# Patient Record
Sex: Female | Born: 1965 | Race: Black or African American | Hispanic: No | Marital: Single | Smoking: Never smoker
Health system: Southern US, Community
[De-identification: ages and names within clinical notes are randomized; demographics above are authoritative.]

## PROBLEM LIST (undated history)

## (undated) DIAGNOSIS — I10 Essential (primary) hypertension: Secondary | ICD-10-CM

## (undated) DIAGNOSIS — M549 Dorsalgia, unspecified: Secondary | ICD-10-CM

## (undated) DIAGNOSIS — F419 Anxiety disorder, unspecified: Secondary | ICD-10-CM

## (undated) DIAGNOSIS — D649 Anemia, unspecified: Secondary | ICD-10-CM

## (undated) DIAGNOSIS — M1990 Unspecified osteoarthritis, unspecified site: Secondary | ICD-10-CM

## (undated) DIAGNOSIS — M5430 Sciatica, unspecified side: Secondary | ICD-10-CM

## (undated) DIAGNOSIS — E119 Type 2 diabetes mellitus without complications: Secondary | ICD-10-CM

## (undated) DIAGNOSIS — G4730 Sleep apnea, unspecified: Secondary | ICD-10-CM

## (undated) DIAGNOSIS — Z8739 Personal history of other diseases of the musculoskeletal system and connective tissue: Secondary | ICD-10-CM

## (undated) DIAGNOSIS — G8929 Other chronic pain: Secondary | ICD-10-CM

## (undated) HISTORY — PX: CHOLECYSTECTOMY: SHX55

## (undated) HISTORY — PX: OTHER SURGICAL HISTORY: SHX169

## (undated) HISTORY — PX: HYSTEROSCOPY: SHX211

---

## 2004-10-06 ENCOUNTER — Emergency Department: Payer: Self-pay | Admitting: Emergency Medicine

## 2005-01-24 ENCOUNTER — Emergency Department: Payer: Self-pay | Admitting: Emergency Medicine

## 2005-04-04 ENCOUNTER — Emergency Department: Payer: Self-pay | Admitting: Emergency Medicine

## 2005-09-21 ENCOUNTER — Emergency Department: Payer: Self-pay | Admitting: Emergency Medicine

## 2006-02-23 ENCOUNTER — Emergency Department: Payer: Self-pay | Admitting: Emergency Medicine

## 2006-09-25 ENCOUNTER — Emergency Department: Payer: Self-pay | Admitting: Emergency Medicine

## 2007-04-22 ENCOUNTER — Emergency Department: Payer: Self-pay | Admitting: Emergency Medicine

## 2007-04-30 ENCOUNTER — Emergency Department: Payer: Self-pay | Admitting: Emergency Medicine

## 2008-02-04 ENCOUNTER — Emergency Department: Payer: Self-pay | Admitting: Emergency Medicine

## 2008-02-16 ENCOUNTER — Emergency Department: Payer: Self-pay | Admitting: Emergency Medicine

## 2008-05-12 ENCOUNTER — Inpatient Hospital Stay: Payer: Self-pay | Admitting: Urology

## 2008-10-30 ENCOUNTER — Ambulatory Visit: Payer: Self-pay

## 2008-11-06 ENCOUNTER — Ambulatory Visit: Payer: Self-pay

## 2008-11-26 ENCOUNTER — Ambulatory Visit: Payer: Self-pay | Admitting: General Surgery

## 2008-11-29 ENCOUNTER — Ambulatory Visit: Payer: Self-pay | Admitting: General Surgery

## 2009-04-18 ENCOUNTER — Emergency Department: Payer: Self-pay | Admitting: Emergency Medicine

## 2009-09-15 ENCOUNTER — Emergency Department: Payer: Self-pay | Admitting: Emergency Medicine

## 2009-10-29 ENCOUNTER — Ambulatory Visit: Payer: Self-pay

## 2010-06-07 ENCOUNTER — Emergency Department: Payer: Self-pay | Admitting: Emergency Medicine

## 2010-09-05 ENCOUNTER — Emergency Department: Payer: Self-pay | Admitting: Emergency Medicine

## 2011-02-06 ENCOUNTER — Emergency Department: Payer: Self-pay | Admitting: Unknown Physician Specialty

## 2011-03-21 ENCOUNTER — Emergency Department: Payer: Self-pay | Admitting: Emergency Medicine

## 2011-08-04 ENCOUNTER — Ambulatory Visit: Payer: Self-pay | Admitting: Family Medicine

## 2011-08-04 LAB — URINALYSIS, COMPLETE
Bacteria: NEGATIVE
Bilirubin,UR: NEGATIVE
Blood: NEGATIVE
Glucose,UR: NEGATIVE mg/dL (ref 0–75)
Ketone: NEGATIVE
Leukocyte Esterase: NEGATIVE
Ph: 7 (ref 4.5–8.0)
Protein: NEGATIVE
Specific Gravity: 1.02 (ref 1.003–1.030)

## 2011-09-09 ENCOUNTER — Ambulatory Visit: Payer: Self-pay

## 2012-02-11 ENCOUNTER — Ambulatory Visit: Payer: Self-pay | Admitting: Family Medicine

## 2012-08-19 IMAGING — US US EXTREM LOW VENOUS BILAT
1 series · 14 of 24 positions shown · non-contrast
Comparison: none

REASON FOR EXAM: [id number] Eval for  DVT leg pain swelling on both
legs
COMMENTS:

PROCEDURE:     FYSCORE - FYSCORE DOPPLER VEINS BIL LEG EXTR  - September 09, 2011  [DATE]
RESULT:     Comparison: None

[Series 1: us extrem low venous bilat · 0.10mm/px · 14 of 65 slices shown]
[im 1/65]
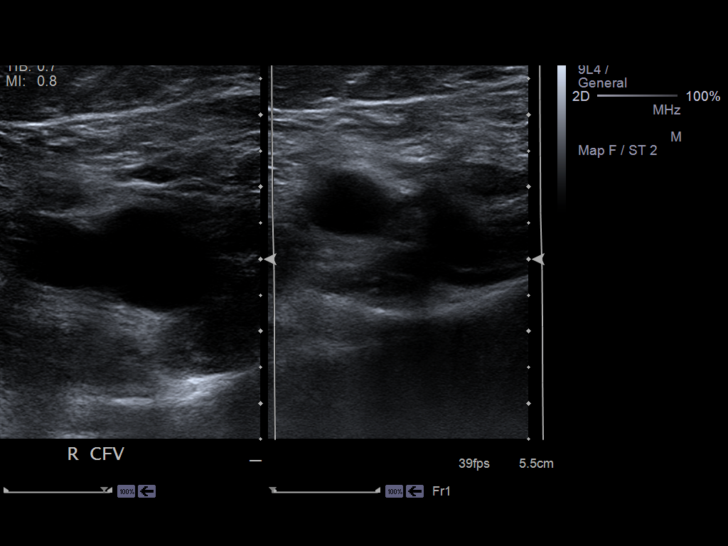
[im 6/65]
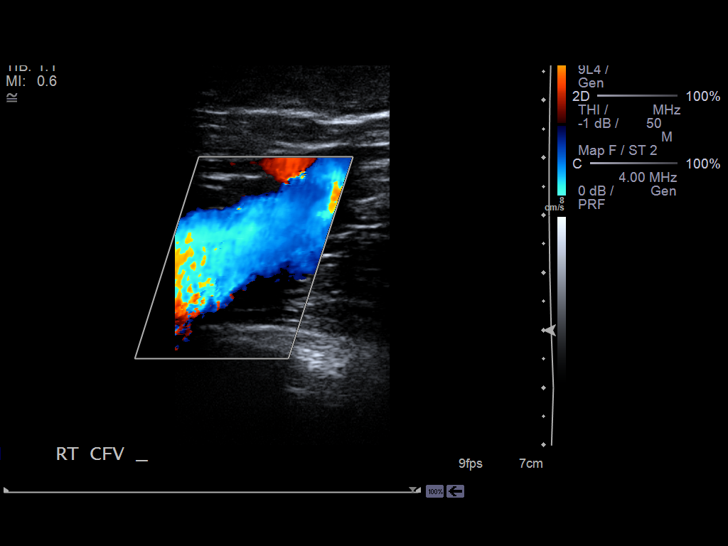
[im 12/65]
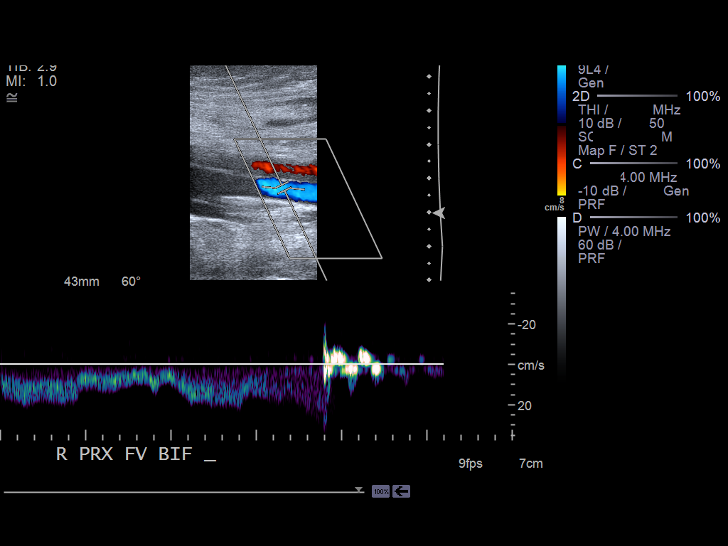
[im 17/65]
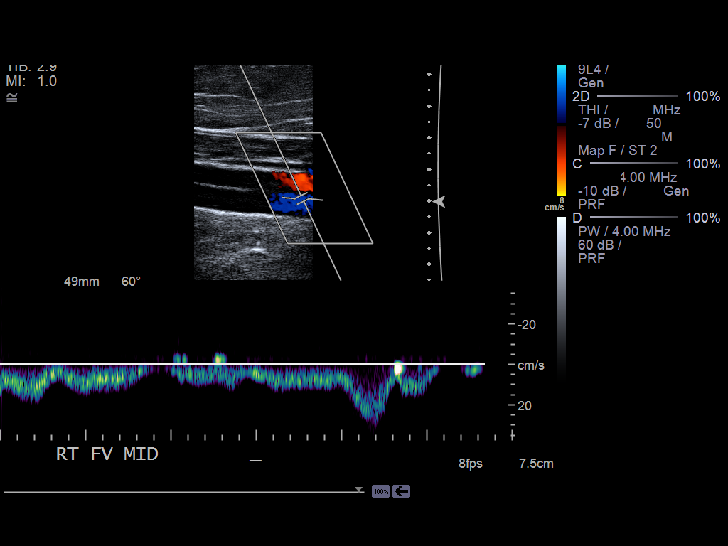
[im 20/65]
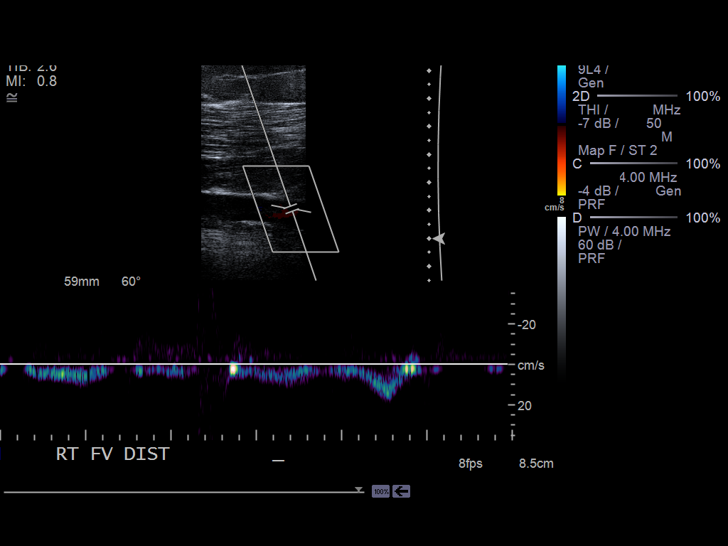
[im 26/65]
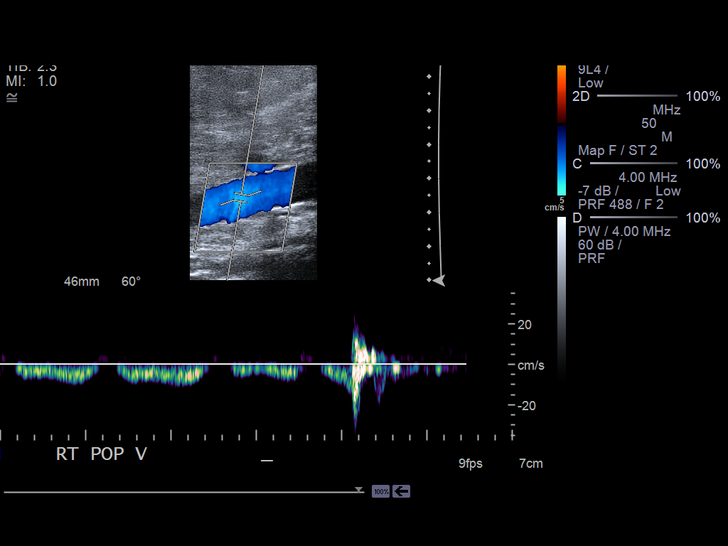
[im 31/65]
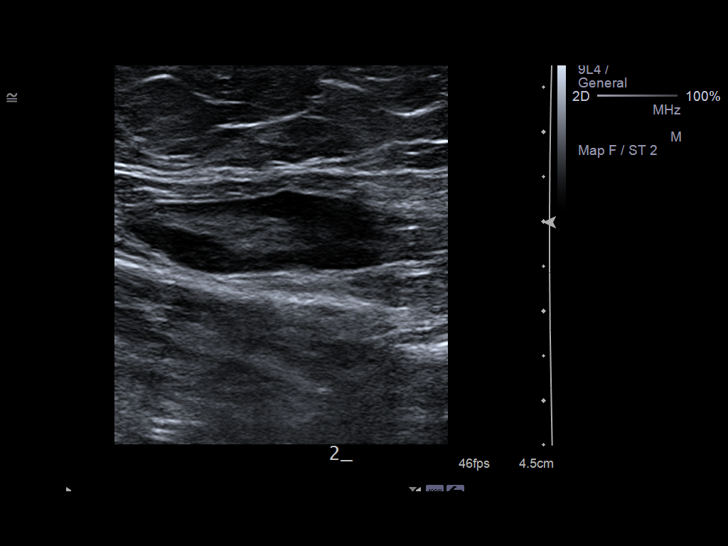
[im 34/65]
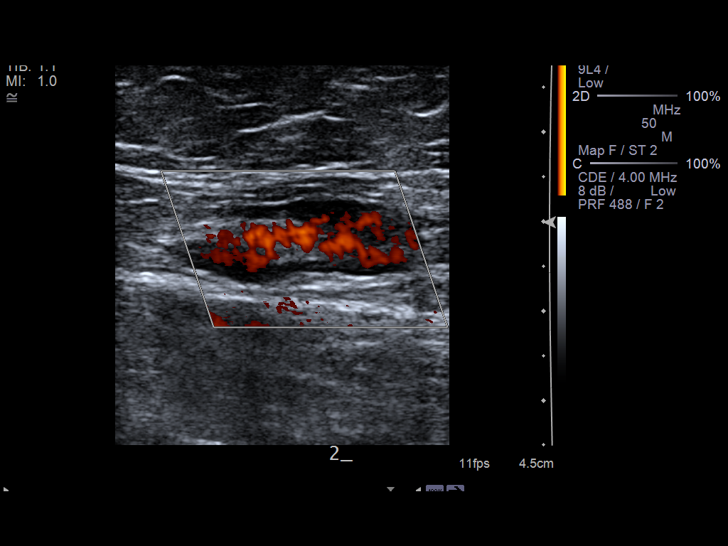
[im 39/65]
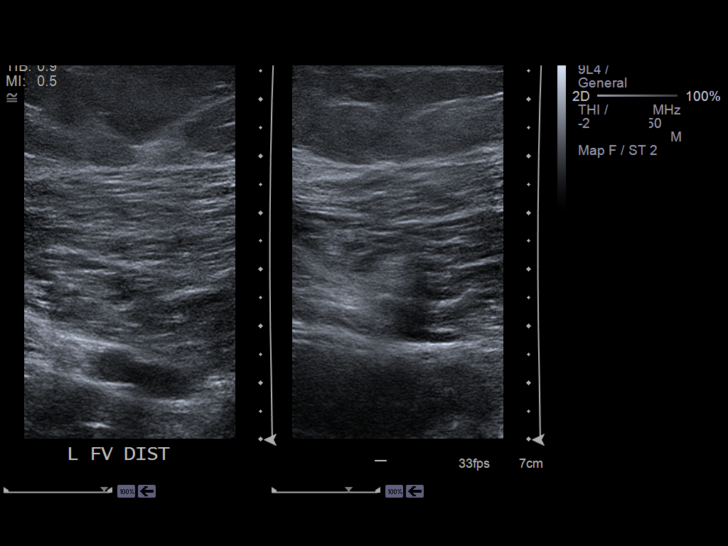
[im 45/65]
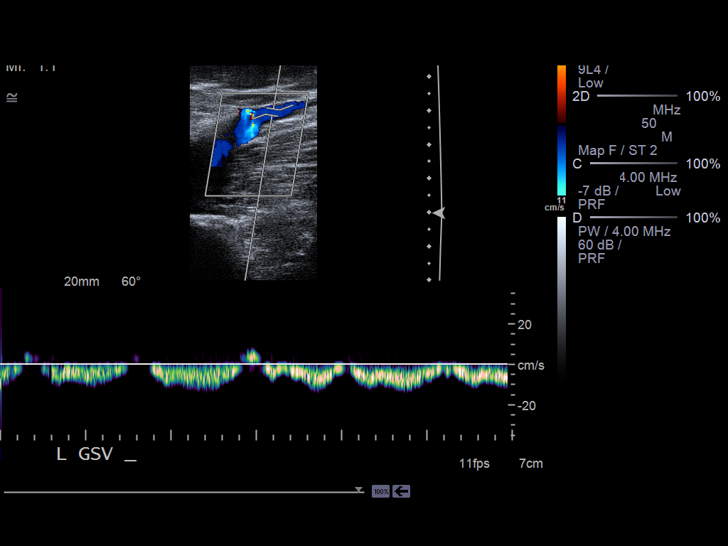
[im 51/65]
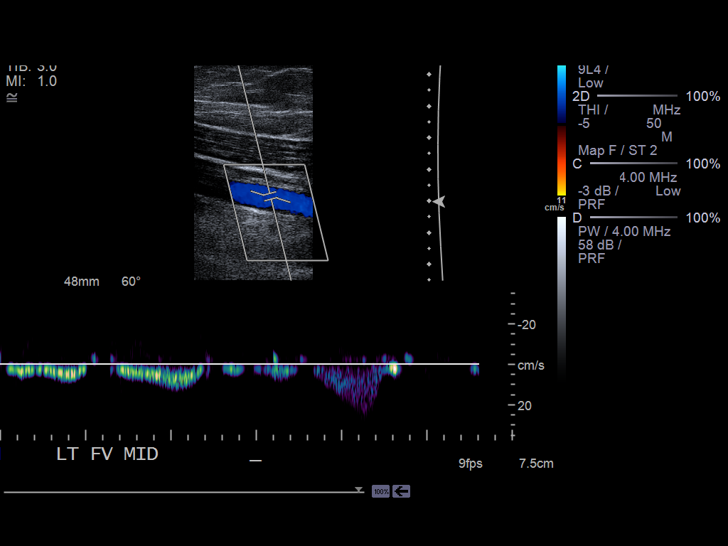
[im 53/65]
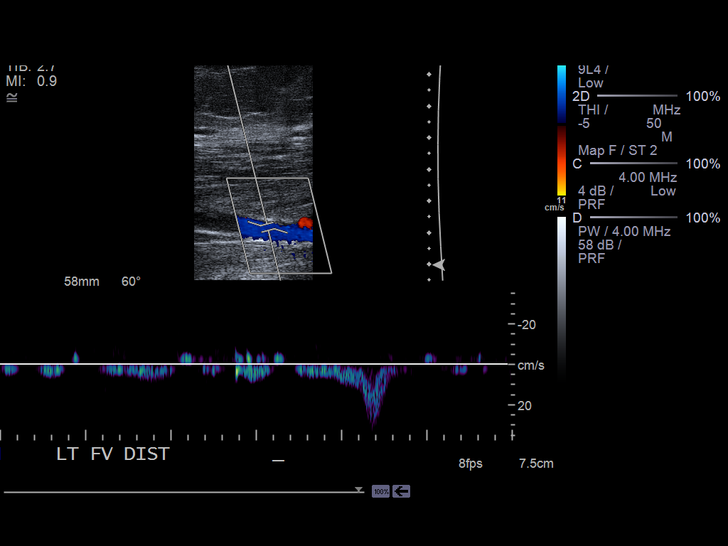
[im 59/65]
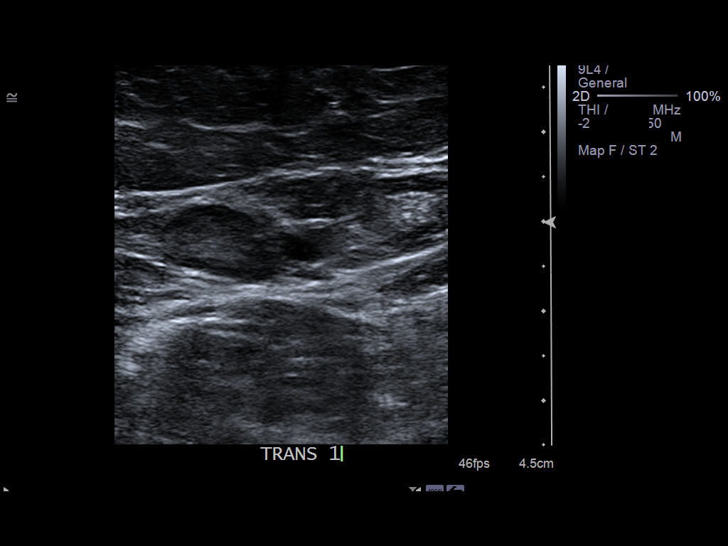
[im 65/65]
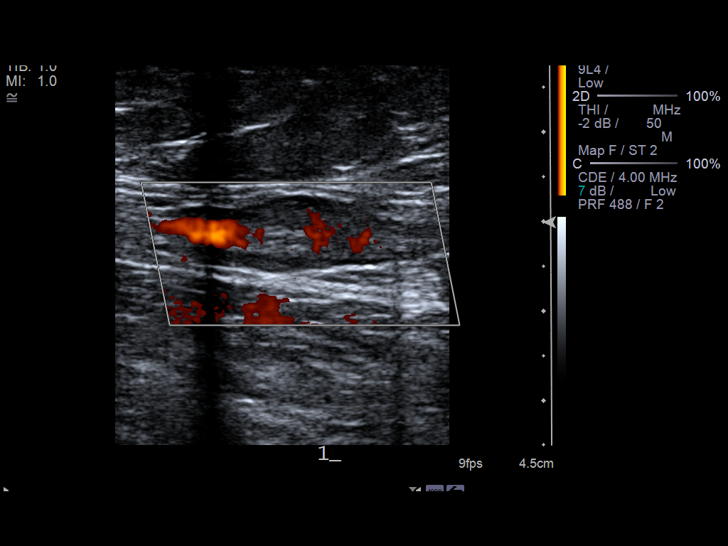

[14 of 24 positions shown; findings below may reference images not displayed]

FINDINGS: Multiple longitudinal and transverse gray-scale as well as color
and spectral Doppler images of  bilateral lower extremity veins were
obtained from the common femoral veins through the popliteal veins.

The right common femoral, greater saphenous, femoral, popliteal veins, and
venous trifurcation are patent, demonstrating normal color-flow and
compressibility. No intraluminal thrombus is identified.There is normal
respiratory variation and augmentation demonstrated at all vein levels.

The left common femoral, greater saphenous, femoral, popliteal veins, and
venous trifurcation are patent, demonstrating normal color-flow and
compressibility. No intraluminal thrombus is identified.There is normal
respiratory variation and augmentation demonstrated at all vein levels.
IMPRESSION: 1. No evidence of DVT in the right lower extremity.
2. No evidence of DVT in the left lower extremity.

## 2013-02-18 ENCOUNTER — Emergency Department: Payer: Self-pay | Admitting: Emergency Medicine

## 2013-02-18 LAB — URINALYSIS, COMPLETE
Bilirubin,UR: NEGATIVE
Glucose,UR: NEGATIVE mg/dL (ref 0–75)
Ketone: NEGATIVE
Ph: 5 (ref 4.5–8.0)
Protein: NEGATIVE
RBC,UR: 2 /HPF (ref 0–5)
Specific Gravity: 1.014 (ref 1.003–1.030)
Squamous Epithelial: 4
WBC UR: 1 /HPF (ref 0–5)

## 2013-10-30 ENCOUNTER — Emergency Department: Payer: Self-pay | Admitting: Emergency Medicine

## 2013-10-30 LAB — COMPREHENSIVE METABOLIC PANEL
ALK PHOS: 48 U/L
ANION GAP: 7 (ref 7–16)
Albumin: 3.4 g/dL (ref 3.4–5.0)
BILIRUBIN TOTAL: 0.5 mg/dL (ref 0.2–1.0)
BUN: 14 mg/dL (ref 7–18)
CHLORIDE: 105 mmol/L (ref 98–107)
Calcium, Total: 9 mg/dL (ref 8.5–10.1)
Co2: 26 mmol/L (ref 21–32)
Creatinine: 1.05 mg/dL (ref 0.60–1.30)
EGFR (Non-African Amer.): >60
GLUCOSE: 87 mg/dL (ref 65–99)
OSMOLALITY: 276 (ref 275–301)
Potassium: 3.6 mmol/L (ref 3.5–5.1)
SGOT(AST): 28 U/L (ref 15–37)
SGPT (ALT): 33 U/L
Sodium: 138 mmol/L (ref 136–145)
TOTAL PROTEIN: 7.8 g/dL (ref 6.4–8.2)

## 2013-10-30 LAB — TROPONIN I: Troponin-I: <0.02 ng/mL

## 2013-10-30 LAB — CBC WITH DIFFERENTIAL/PLATELET
Basophil #: 0 10*3/uL (ref 0.0–0.1)
Basophil %: 0.3 %
Eosinophil #: 0.3 10*3/uL (ref 0.0–0.7)
Eosinophil %: 4.3 %
HCT: 37.6 % (ref 35.0–47.0)
HGB: 11.5 g/dL — AB (ref 12.0–16.0)
LYMPHS ABS: 1.9 10*3/uL (ref 1.0–3.6)
LYMPHS PCT: 27 %
MCH: 24.2 pg — ABNORMAL LOW (ref 26.0–34.0)
MCHC: 30.5 g/dL — ABNORMAL LOW (ref 32.0–36.0)
MCV: 80 fL (ref 80–100)
MONO ABS: 0.6 x10 3/mm (ref 0.2–0.9)
Monocyte %: 9 %
NEUTROS ABS: 4.1 10*3/uL (ref 1.4–6.5)
NEUTROS PCT: 59.4 %
Platelet: 320 10*3/uL (ref 150–440)
RBC: 4.73 10*6/uL (ref 3.80–5.20)
RDW: 16.9 % — AB (ref 11.5–14.5)
WBC: 6.9 10*3/uL (ref 3.6–11.0)

## 2013-12-06 DIAGNOSIS — J309 Allergic rhinitis, unspecified: Secondary | ICD-10-CM | POA: Insufficient documentation

## 2013-12-06 DIAGNOSIS — D509 Iron deficiency anemia, unspecified: Secondary | ICD-10-CM | POA: Insufficient documentation

## 2014-03-21 ENCOUNTER — Emergency Department: Payer: Self-pay | Admitting: Student

## 2014-12-18 ENCOUNTER — Emergency Department
Admission: EM | Admit: 2014-12-18 | Discharge: 2014-12-18 | Disposition: A | Discharge status: Home / Self Care | Payer: Self-pay | Attending: Student | Admitting: Student

## 2014-12-18 ENCOUNTER — Encounter: Payer: Self-pay | Admitting: Emergency Medicine

## 2014-12-18 DIAGNOSIS — M549 Dorsalgia, unspecified: Secondary | ICD-10-CM | POA: Insufficient documentation

## 2014-12-18 DIAGNOSIS — I10 Essential (primary) hypertension: Secondary | ICD-10-CM | POA: Insufficient documentation

## 2014-12-18 DIAGNOSIS — G8929 Other chronic pain: Secondary | ICD-10-CM

## 2014-12-18 HISTORY — DX: Essential (primary) hypertension: I10

## 2014-12-18 MED ORDER — HYDROMORPHONE HCL 1 MG/ML IJ SOLN
1.0000 mg | Freq: Once | INTRAMUSCULAR | Status: AC
  Administered 2014-12-18: 1 mg via INTRAMUSCULAR
  Filled 2014-12-18: qty 1

## 2014-12-18 MED ORDER — CYCLOBENZAPRINE HCL 10 MG PO TABS: 10.0000 mg | ORAL_TABLET | Freq: Three times a day (TID) | ORAL | Status: DC | PRN

## 2014-12-18 MED ORDER — TRAMADOL HCL 50 MG PO TABS: 50.0000 mg | ORAL_TABLET | Freq: Four times a day (QID) | ORAL | Status: DC | PRN

## 2014-12-18 MED ORDER — IBUPROFEN 800 MG PO TABS: 800.0000 mg | ORAL_TABLET | Freq: Three times a day (TID) | ORAL | Status: DC | PRN

## 2014-12-18 MED ORDER — ONDANSETRON 4 MG PO TBDP
ORAL_TABLET | ORAL | Status: AC
  Administered 2014-12-18: 4 mg via ORAL
  Filled 2014-12-18: qty 1

## 2014-12-18 MED ORDER — ONDANSETRON 4 MG PO TBDP
4.0000 mg | ORAL_TABLET | Freq: Once | ORAL | Status: AC
  Administered 2014-12-18: 4 mg via ORAL

## 2014-12-18 MED ORDER — KETOROLAC TROMETHAMINE 60 MG/2ML IM SOLN
60.0000 mg | Freq: Once | INTRAMUSCULAR | Status: AC
  Administered 2014-12-18: 60 mg via INTRAMUSCULAR
  Filled 2014-12-18: qty 2

## 2014-12-18 MED ORDER — ORPHENADRINE CITRATE 30 MG/ML IJ SOLN
60.0000 mg | Freq: Two times a day (BID) | INTRAMUSCULAR | Status: DC
  Administered 2014-12-18: 60 mg via INTRAMUSCULAR
  Filled 2014-12-18: qty 2

## 2014-12-18 NOTE — ED Notes (Signed)
Pt placed on med hold for next 20 mins, pt made aware and verbalized understanding at this time

## 2014-12-18 NOTE — ED Notes (Signed)
Pt c/o having nausea and dizziness from the medications, EDP notified and order for zofran received.Marland Kitchen

## 2014-12-18 NOTE — ED Notes (Signed)
Says hx back pain and disk problems . Pain this time for about 1 week.  Unable to sleep due to pain.

## 2014-12-18 NOTE — ED Provider Notes (Signed)
[facility] Emergency Department Provider Note  ____________________________________________  Time seen: Approximately 10:29 AM  I have reviewed the triage vital signs and the nursing notes.   HISTORY  Chief Complaint Back Pain    HPI Ariel Davis is a 49 y.o. female complaining of chronic back pain secondary to this problem which was diagnosed over a year ago. Patient state she lost her job secondary to missing work due to her back pain and now has no insurance. Patient states she has not been treated for back pain for over 6 months. Patient stated pain is increasing with radicular component to the bilateral legs. Patient denies any bladder or bowel dysfunction. Patient is contact the [facility] pain management clinic and they were told she needed referral from her family doctor before they can see her for evaluation. Patient plans to leave here from the ED and go to [place] clinic and talked to the nurse about getting a referral. Patient rates her pain as a 9/10 and described as sharp. No palliative measures for this complaint. Review of the ladder narcotic registers shows no prescriptions in the past year..   Past Medical History  Diagnosis Date  . Hypertension     There are no active problems to display for this patient.   No past surgical history on file.  Current Outpatient Rx  Name  Route  Sig  Dispense  Refill  . cyclobenzaprine (FLEXERIL) 10 MG tablet   Oral   Take 1 tablet (10 mg total) by mouth every 8 (eight) hours as needed for muscle spasms.   15 tablet   0   . ibuprofen (ADVIL,MOTRIN) 800 MG tablet   Oral   Take 1 tablet (800 mg total) by mouth every 8 (eight) hours as needed for moderate pain.   15 tablet   0   . traMADol (ULTRAM) 50 MG tablet   Oral   Take 1 tablet (50 mg total) by mouth every 6 (six) hours as needed.   20 tablet   0     Allergies Review of patient's allergies indicates no known allergies.  No family history  on file.  Social History Social History  Substance Use Topics  . Smoking status: Never Smoker   . Smokeless tobacco: None  . Alcohol Use: No    Review of Systems Constitutional: No fever/chills. Overweight  Eyes: No visual changes. ENT: No sore throat. Cardiovascular: Denies chest pain. Respiratory: Denies shortness of breath. Gastrointestinal: No abdominal pain.  No nausea, no vomiting.  No diarrhea.  No constipation. Genitourinary: Negative for dysuria. Musculoskeletal:Chronic back pain  Negative for rash. Neurological: Negative for headaches, focal weakness or numbness. 10-point ROS otherwise negative.  ____________________________________________   PHYSICAL EXAM:  VITAL SIGNS: ED Triage Vitals  Enc Vitals Group     BP --      Pulse --      Resp 12/18/14 1014 18     Temp 12/18/14 1014 98.4 F (36.9 C)     Temp Source 12/18/14 1014 Oral     SpO2 12/18/14 1014 99 %     Weight 12/18/14 1014 245 lb (111.131 kg)     Height 12/18/14 1014 5\' 8"  (1.727 m)     Head Cir --      Peak Flow --      Pain Score 12/18/14 1015 9     Pain Loc --      Pain Edu? --      Excl. in GC? --  Constitutional: Alert and oriented. Well appearing and in no acute distress. Eyes: Conjunctivae are normal. PERRL. EOMI. Head: Atraumatic. Nose: No congestion/rhinnorhea. Mouth/Throat: Mucous membranes are moist.  Oropharynx non-erythematous. Neck: No stridor.   Cardiovascular: Normal rate, regular rhythm. Grossly normal heart sounds.  Good peripheral circulation. Respiratory: Normal respiratory effort.  No retractions. Lungs CTAB. Gastrointestinal: Soft and nontender. No distention. No abdominal bruits. No CVA tenderness. Musculoskeletal: No spinal deformity. Patient maintains a flexed position while standing. Patient has decreased range of motion with extension and lateral motions are full and equal. Patient is a negative straight leg raise bilaterally. Patient has an atypical gait and  posture when walking.  Neurologic:  Normal speech and language. No gross focal neurologic deficits are appreciated. No gait instability. Skin:  Skin is warm, dry and intact. No rash noted. Psychiatric: Mood and affect are normal. Speech and behavior are normal.  ____________________________________________   LABS (all labs ordered are listed, but only abnormal results are displayed)  Labs Reviewed - No data to display ____________________________________________  EKG   ____________________________________________  RADIOLOGY   ____________________________________________   PROCEDURES  Procedure(s) performed: None  Critical Care performed: No  ____________________________________________   INITIAL IMPRESSION / ASSESSMENT AND PLAN / ED COURSE  Pertinent labs & imaging results that were available during my care of the patient were reviewed by me and considered in my medical decision making (see chart for details).  Chronic back pain. Patient given a prescription for tramadol,  Ibuprofen and Flexeril. Patient will follow-up with [facility] pain management clinic.  ,____________________________________________   FINAL CLINICAL IMPRESSION(S) / ED DIAGNOSES  Final diagnoses:  Chronic back pain greater than 3 months duration       Joni Reining, PA-C 12/18/14 1050  Gayla Doss, MD 12/18/14 1450

## 2015-08-02 ENCOUNTER — Emergency Department: Payer: Self-pay

## 2015-08-02 ENCOUNTER — Encounter: Payer: Self-pay | Admitting: Emergency Medicine

## 2015-08-02 ENCOUNTER — Emergency Department
Admission: EM | Admit: 2015-08-02 | Discharge: 2015-08-02 | Disposition: A | Discharge status: Home / Self Care | Payer: Self-pay | Attending: Emergency Medicine | Admitting: Emergency Medicine

## 2015-08-02 DIAGNOSIS — M5136 Other intervertebral disc degeneration, lumbar region: Secondary | ICD-10-CM | POA: Insufficient documentation

## 2015-08-02 DIAGNOSIS — M5442 Lumbago with sciatica, left side: Secondary | ICD-10-CM | POA: Insufficient documentation

## 2015-08-02 DIAGNOSIS — M5440 Lumbago with sciatica, unspecified side: Secondary | ICD-10-CM

## 2015-08-02 DIAGNOSIS — M5441 Lumbago with sciatica, right side: Secondary | ICD-10-CM | POA: Insufficient documentation

## 2015-08-02 DIAGNOSIS — Z791 Long term (current) use of non-steroidal anti-inflammatories (NSAID): Secondary | ICD-10-CM | POA: Insufficient documentation

## 2015-08-02 DIAGNOSIS — I10 Essential (primary) hypertension: Secondary | ICD-10-CM | POA: Insufficient documentation

## 2015-08-02 MED ORDER — METHYLPREDNISOLONE 4 MG PO TBPK: ORAL_TABLET | ORAL | Status: DC

## 2015-08-02 MED ORDER — CARISOPRODOL 350 MG PO TABS: 350.0000 mg | ORAL_TABLET | Freq: Three times a day (TID) | ORAL | Status: DC | PRN

## 2015-08-02 MED ORDER — PREDNISONE 20 MG PO TABS
60.0000 mg | ORAL_TABLET | Freq: Once | ORAL | Status: AC
  Administered 2015-08-02: 60 mg via ORAL
  Filled 2015-08-02: qty 3

## 2015-08-02 MED ORDER — KETOROLAC TROMETHAMINE 60 MG/2ML IM SOLN
60.0000 mg | Freq: Once | INTRAMUSCULAR | Status: AC
  Administered 2015-08-02: 60 mg via INTRAMUSCULAR
  Filled 2015-08-02: qty 2

## 2015-08-02 NOTE — ED Notes (Signed)
Pt c/o lower back pain that has caused her to be able to walk. Pt is a/o with NAD noted at this time.

## 2015-08-02 NOTE — ED Provider Notes (Signed)
[facility] Emergency Department Provider Note  ____________________________________________  Time seen: Approximately 6 AM  I have reviewed the triage vital signs and the nursing notes.   HISTORY  Chief Complaint Back Pain   HPI Ariel Davis is a 50 y.o. female with a history of hypertension and degenerative disc disease who is presenting with 3 days of worsening back pain. She says that she may been a little more active than normal but did not do any particular heavy lifting or bending that set off her pain. She also denies any injury. She says that the pain is to the low back and in the middle. She says that it radiates down to her feet bilaterally. She denies any weakness or numbness. Says that the pain is aching and then tingling and burning in her feet when it radiates. She says that walking is very painful. She drove herself to the hospital today because of the increase in pain and ibuprofen not working at home.Denies any loss of bowel or bladder continence.   Past Medical History  Diagnosis Date  . Hypertension     There are no active problems to display for this patient.   History reviewed. No pertinent past surgical history.  Current Outpatient Rx  Name  Route  Sig  Dispense  Refill  . cyclobenzaprine (FLEXERIL) 10 MG tablet   Oral   Take 1 tablet (10 mg total) by mouth every 8 (eight) hours as needed for muscle spasms.   15 tablet   0   . ibuprofen (ADVIL,MOTRIN) 800 MG tablet   Oral   Take 1 tablet (800 mg total) by mouth every 8 (eight) hours as needed for moderate pain.   15 tablet   0   . traMADol (ULTRAM) 50 MG tablet   Oral   Take 1 tablet (50 mg total) by mouth every 6 (six) hours as needed.   20 tablet   0     Allergies Review of patient's allergies indicates no known allergies.  No family history on file.  Social History Social History  Substance Use Topics  . Smoking status: Never Smoker   . Smokeless tobacco:  None  . Alcohol Use: No    Review of Systems Constitutional: No fever/chills Eyes: No visual changes. ENT: No sore throat. Cardiovascular: Denies chest pain. Respiratory: Denies shortness of breath. Gastrointestinal: No abdominal pain.  No nausea, no vomiting.  No diarrhea.  No constipation. Genitourinary: Negative for dysuria. Musculoskeletal: As above. Skin: Negative for rash. Neurological: Negative for headaches, focal weakness or numbness.  10-point ROS otherwise negative.  ____________________________________________   PHYSICAL EXAM:  VITAL SIGNS: ED Triage Vitals  Enc Vitals Group     BP 08/02/15 0422 163/95 mmHg     Pulse Rate 08/02/15 0422 66     Resp 08/02/15 0422 20     Temp 08/02/15 0422 98.3 F (36.8 C)     Temp Source 08/02/15 0422 Oral     SpO2 08/02/15 0422 100 %     Weight 08/02/15 0422 240 lb (108.863 kg)     Height 08/02/15 0422  (1.753 m)     Head Cir --      Peak Flow --      Pain Score 08/02/15 0423 9     Pain Loc --      Pain Edu? --      Excl. in GC? --     Constitutional: Alert and oriented. Sitting on the side of the  bed. She says that it hurts to lay down. Eyes: Conjunctivae are normal. PERRL. EOMI. Head: Atraumatic. Nose: No congestion/rhinnorhea.  Mouth/Throat: Mucous membranes are moist.   Neck: No stridor.   Cardiovascular: Normal rate, regular rhythm. Grossly normal heart sounds.  Good peripheral circulation with bilateral and equal dorsalis pedis pulses.  Respiratory: Normal respiratory effort.  No retractions. Lungs CTAB. Gastrointestinal: Soft and nontender. No distention. o CVA tenderness. Musculoskeletal: No lower extremity tenderness nor edema.  No joint effusions. Tenderness palpation in the midline L2 through 4 areas. No deformity or step-off. No lateral tenderness to palpation. Bilateral positive straight leg raise test. No saddle anesthesia. 5 out of 5 strength to bilateral lower extremities and the patient is able to  ambulate without any assistance as well as stand on her tiptoes. Neurologic:  Normal speech and language. No gross focal neurologic deficits are appreciated. No gait instability. Skin:  Skin is warm, dry and intact. No rash noted. Psychiatric: Mood and affect are normal. Speech and behavior are normal.  ____________________________________________   LABS (all labs ordered are listed, but only abnormal results are displayed)  Labs Reviewed - No data to display ____________________________________________  EKG   ____________________________________________  RADIOLOGY   Lumbar films. ____________________________________________   PROCEDURES    ____________________________________________   INITIAL IMPRESSION / ASSESSMENT AND PLAN / ED COURSE  Pertinent labs & imaging results that were available during my care of the patient were reviewed by me and considered in my medical decision making (see chart for details).  ----------------------------------------- 7:07 AM on 08/02/2015 -----------------------------------------  Patient with bilateral sciatica symptoms. No signs of cord compression. Signed out to Dr. Shaune PollackLord. If patient is feeling better and the x-rays are reassuring that the patient will be discharged to home with a Medrol Dosepak and muscle relaxers. ____________________________________________   FINAL CLINICAL IMPRESSION(S) / ED DIAGNOSES  Low back pain. Bilateral sciatica.      Myrna Blazeravid Matthew Schaevitz, MD 08/02/15 [phone]

## 2015-08-02 NOTE — ED Notes (Signed)
Patient ambulatory to triage. Patient reports that she has had lower back pain times 2 weeks. Patient reports that the pain became worse this morning. Patient denies any injury.

## 2015-08-06 ENCOUNTER — Encounter: Payer: Self-pay | Admitting: Emergency Medicine

## 2015-08-06 ENCOUNTER — Emergency Department
Admission: EM | Admit: 2015-08-06 | Discharge: 2015-08-06 | Disposition: A | Discharge status: Home / Self Care | Payer: Self-pay | Attending: Emergency Medicine | Admitting: Emergency Medicine

## 2015-08-06 DIAGNOSIS — M545 Low back pain, unspecified: Secondary | ICD-10-CM

## 2015-08-06 DIAGNOSIS — Z7952 Long term (current) use of systemic steroids: Secondary | ICD-10-CM | POA: Insufficient documentation

## 2015-08-06 DIAGNOSIS — I10 Essential (primary) hypertension: Secondary | ICD-10-CM | POA: Insufficient documentation

## 2015-08-06 MED ORDER — OXYCODONE-ACETAMINOPHEN 5-325 MG PO TABS: 2.0000 | ORAL_TABLET | ORAL | Status: DC | PRN

## 2015-08-06 MED ORDER — PREDNISONE 20 MG PO TABS
60.0000 mg | ORAL_TABLET | Freq: Once | ORAL | Status: AC
  Administered 2015-08-06: 60 mg via ORAL
  Filled 2015-08-06: qty 3

## 2015-08-06 MED ORDER — OXYCODONE-ACETAMINOPHEN 5-325 MG PO TABS
2.0000 | ORAL_TABLET | Freq: Once | ORAL | Status: AC
  Administered 2015-08-06: 2 via ORAL
  Filled 2015-08-06: qty 2

## 2015-08-06 NOTE — ED Notes (Signed)
Pt in with co lower back pain hx of the same was seen here recently for the same and given muscle relaxants without relief.

## 2015-08-06 NOTE — Discharge Instructions (Signed)

## 2015-08-06 NOTE — ED Provider Notes (Signed)
[facility] Emergency Department Provider Note  ____________________________________________  Time seen: 4:00 AM  I have reviewed the triage vital signs and the nursing notes.   HISTORY  Chief Complaint Back Pain     HPI Ariel Davis is a 50 y.o. female with history of degenerative disc disease times few years presents with low back pain with radiation to bilateral buttocks patient states that he can't pain became acutely worsened on Saturday. Patient denies any bowel or urinary symptoms.     Past Medical History  Diagnosis Date  . Hypertension     There are no active problems to display for this patient.   History reviewed. No pertinent past surgical history.  Current Outpatient Rx  Name  Route  Sig  Dispense  Refill  . carisoprodol (SOMA) 350 MG tablet   Oral   Take 1 tablet (350 mg total) by mouth 3 (three) times daily as needed for muscle spasms.   15 tablet   0   . methylPREDNISolone (MEDROL DOSEPAK) 4 MG TBPK tablet      Follow directions on packaging.   21 tablet   0   . ibuprofen (ADVIL,MOTRIN) 200 MG tablet   Oral   Take 800 mg by mouth every 6 (six) hours as needed for headache or mild pain.         Marland Kitchen. oxyCODONE-acetaminophen (PERCOCET/ROXICET) 5-325 MG tablet   Oral   Take 2 tablets by mouth every 4 (four) hours as needed for severe pain.   20 tablet   0     Allergies No known drug allergies History reviewed. No pertinent family history.  Social History Social History  Substance Use Topics  . Smoking status: Never Smoker   . Smokeless tobacco: None  . Alcohol Use: No    Review of Systems  Constitutional: Negative for fever. Eyes: Negative for visual changes. ENT: Negative for sore throat. Cardiovascular: Negative for chest pain. Respiratory: Negative for shortness of breath. Gastrointestinal: Negative for abdominal pain, vomiting and diarrhea. Genitourinary: Negative for  dysuria. Musculoskeletal:Positive for back pain. Skin: Negative for rash. Neurological: Negative for headaches, focal weakness or numbness.   10-point ROS otherwise negative.  ____________________________________________   PHYSICAL EXAM:  VITAL SIGNS: ED Triage Vitals  Enc Vitals Group     BP 08/06/15 0136 187/112 mmHg     Pulse Rate 08/06/15 0136 68     Resp 08/06/15 0136 18     Temp 08/06/15 0136 98.2 F (36.8 C)     Temp Source 08/06/15 0136 Oral     SpO2 08/06/15 0136 99 %     Weight 08/06/15 0136 240 lb (108.863 kg)     Height 08/06/15 0136 5\' 8"  (1.727 m)     Head Cir --      Peak Flow --      Pain Score 08/06/15 0137 10     Pain Loc --      Pain Edu? --      Excl. in GC? --      Constitutional: Alert and oriented. Well appearing and in no distress. Eyes: Conjunctivae are normal. PERRL. Normal extraocular movements. ENT   Head: Normocephalic and atraumatic.   Nose: No congestion/rhinnorhea.   Mouth/Throat: Mucous membranes are moist.   Neck: No stridor. Hematological/Lymphatic/Immunilogical: No cervical lymphadenopathy. Cardiovascular: Normal rate, regular rhythm. Normal and symmetric distal pulses are present in all extremities. No murmurs, rubs, or gallops. Respiratory: Normal respiratory effort without tachypnea nor retractions. Breath sounds are clear and  equal bilaterally. No wheezes/rales/rhonchi. Gastrointestinal: Soft and nontender. No distention. There is no CVA tenderness. Genitourinary: deferred Musculoskeletal: Nontender with normal range of motion in all extremities. No joint effusions.  No lower extremity tenderness nor edema.lumbar paraspinal muscle pain with palpation Neurologic:  Normal speech and language. No gross focal neurologic deficits are appreciated. Speech is normal.  Skin:  Skin is warm, dry and intact. No rash noted. Psychiatric: Mood and affect are normal. Speech and behavior are normal. Patient exhibits appropriate  insight and judgment.     INITIAL IMPRESSION / ASSESSMENT AND PLAN / ED COURSE  Pertinent labs & imaging results that were available during my care of the patient were reviewed by me and considered in my medical decision making (see chart for details).  Patient received Percocet with improvement of pain will be prescribed same at home and referred to Dr. Martha Clan orthopedic surgeon on call  ____________________________________________   FINAL CLINICAL IMPRESSION(S) / ED DIAGNOSES  Final diagnoses:  Left-sided low back pain without sciatica      Darci Current, MD 08/06/15 [phone]

## 2015-08-06 NOTE — ED Notes (Signed)
Pt reports hx of DDD x couple years but has been unable to follow up.  Pt reports pain has gotten worse, pt was seen on Saturday and written prescription for muscle relaxant which pt has taken w/o relief.  Pt reports pain has worsened since Saturday.  Pt NAD at this time, resp equal and unlabored, skin warm and dry.

## 2015-09-07 ENCOUNTER — Emergency Department: Payer: No Typology Code available for payment source

## 2015-09-07 ENCOUNTER — Emergency Department
Admission: EM | Admit: 2015-09-07 | Discharge: 2015-09-07 | Disposition: A | Discharge status: Home / Self Care | Payer: No Typology Code available for payment source | Attending: Emergency Medicine | Admitting: Emergency Medicine

## 2015-09-07 ENCOUNTER — Encounter: Payer: Self-pay | Admitting: Medical Oncology

## 2015-09-07 DIAGNOSIS — I10 Essential (primary) hypertension: Secondary | ICD-10-CM | POA: Insufficient documentation

## 2015-09-07 DIAGNOSIS — Y92410 Unspecified street and highway as the place of occurrence of the external cause: Secondary | ICD-10-CM | POA: Insufficient documentation

## 2015-09-07 DIAGNOSIS — Y9389 Activity, other specified: Secondary | ICD-10-CM | POA: Insufficient documentation

## 2015-09-07 DIAGNOSIS — M5441 Lumbago with sciatica, right side: Secondary | ICD-10-CM | POA: Diagnosis not present

## 2015-09-07 DIAGNOSIS — M62830 Muscle spasm of back: Secondary | ICD-10-CM

## 2015-09-07 DIAGNOSIS — Y999 Unspecified external cause status: Secondary | ICD-10-CM | POA: Diagnosis not present

## 2015-09-07 HISTORY — DX: Dorsalgia, unspecified: M54.9

## 2015-09-07 HISTORY — DX: Other chronic pain: G89.29

## 2015-09-07 MED ORDER — CYCLOBENZAPRINE HCL 10 MG PO TABS: 10.0000 mg | ORAL_TABLET | Freq: Three times a day (TID) | ORAL | Status: DC | PRN

## 2015-09-07 MED ORDER — TRAMADOL HCL 50 MG PO TABS: 50.0000 mg | ORAL_TABLET | Freq: Four times a day (QID) | ORAL | Status: DC | PRN

## 2015-09-07 MED ORDER — PREDNISONE 10 MG PO TABS: ORAL_TABLET | ORAL | Status: DC

## 2015-09-07 MED ORDER — KETOROLAC TROMETHAMINE 30 MG/ML IJ SOLN
30.0000 mg | Freq: Once | INTRAMUSCULAR | Status: AC
  Administered 2015-09-07: 30 mg via INTRAMUSCULAR
  Filled 2015-09-07: qty 1
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
## 2015-11-22 ENCOUNTER — Emergency Department: Payer: Self-pay

## 2015-11-22 ENCOUNTER — Emergency Department
Admission: EM | Admit: 2015-11-22 | Discharge: 2015-11-22 | Disposition: A | Discharge status: Home / Self Care | Payer: Self-pay | Attending: Emergency Medicine | Admitting: Emergency Medicine

## 2015-11-22 ENCOUNTER — Encounter: Payer: Self-pay | Admitting: Emergency Medicine

## 2015-11-22 DIAGNOSIS — I10 Essential (primary) hypertension: Secondary | ICD-10-CM | POA: Insufficient documentation

## 2015-11-22 DIAGNOSIS — Z79899 Other long term (current) drug therapy: Secondary | ICD-10-CM | POA: Insufficient documentation

## 2015-11-22 DIAGNOSIS — N858 Other specified noninflammatory disorders of uterus: Secondary | ICD-10-CM | POA: Insufficient documentation

## 2015-11-22 LAB — COMPREHENSIVE METABOLIC PANEL
ALBUMIN: 4 g/dL (ref 3.5–5.0)
ALT: 34 U/L (ref 14–54)
ANION GAP: 6 (ref 5–15)
AST: 26 U/L (ref 15–41)
Alkaline Phosphatase: 56 U/L (ref 38–126)
BILIRUBIN TOTAL: 0.7 mg/dL (ref 0.3–1.2)
BUN: 19 mg/dL (ref 6–20)
CHLORIDE: 107 mmol/L (ref 101–111)
CO2: 28 mmol/L (ref 22–32)
Calcium: 9.4 mg/dL (ref 8.9–10.3)
Creatinine, Ser: 0.86 mg/dL (ref 0.44–1.00)
GFR calc Af Amer: >60 mL/min (ref >60)
GFR calc non Af Amer: >60 mL/min (ref >60)
GLUCOSE: 96 mg/dL (ref 65–99)
POTASSIUM: 3.8 mmol/L (ref 3.5–5.1)
SODIUM: 141 mmol/L (ref 135–145)
TOTAL PROTEIN: 7.1 g/dL (ref 6.5–8.1)

## 2015-11-22 LAB — LIPASE, BLOOD: LIPASE: 35 U/L (ref 11–51)

## 2015-11-22 LAB — CBC
HEMATOCRIT: 39.7 % (ref 35.0–47.0)
HEMOGLOBIN: 12.9 g/dL (ref 12.0–16.0)
MCH: 26.5 pg (ref 26.0–34.0)
MCHC: 32.5 g/dL (ref 32.0–36.0)
MCV: 81.4 fL (ref 80.0–100.0)
Platelets: 259 10*3/uL (ref 150–440)
RBC: 4.87 MIL/uL (ref 3.80–5.20)
RDW: 14.9 % — ABNORMAL HIGH (ref 11.5–14.5)
WBC: 5 10*3/uL (ref 3.6–11.0)

## 2015-11-22 LAB — URINALYSIS COMPLETE WITH MICROSCOPIC (ARMC ONLY)
BACTERIA UA: NONE SEEN
Bilirubin Urine: NEGATIVE
Glucose, UA: NEGATIVE mg/dL
HGB URINE DIPSTICK: NEGATIVE
Ketones, ur: NEGATIVE mg/dL
LEUKOCYTES UA: NEGATIVE
NITRITE: NEGATIVE
PH: 5 (ref 5.0–8.0)
Protein, ur: NEGATIVE mg/dL
SPECIFIC GRAVITY, URINE: 1.019 (ref 1.005–1.030)

## 2015-11-22 LAB — POCT PREGNANCY, URINE: PREG TEST UR: NEGATIVE

## 2015-11-22 MED ORDER — IOPAMIDOL (ISOVUE-300) INJECTION 61%
100.0000 mL | Freq: Once | INTRAVENOUS | Status: AC | PRN
  Administered 2015-11-22: 100 mL via INTRAVENOUS

## 2015-11-22 MED ORDER — OXYCODONE-ACETAMINOPHEN 5-325 MG PO TABS: 1.0000 | ORAL_TABLET | Freq: Four times a day (QID) | ORAL | 0 refills | Status: DC | PRN

## 2015-11-22 MED ORDER — MORPHINE SULFATE (PF) 4 MG/ML IV SOLN
4.0000 mg | Freq: Once | INTRAVENOUS | Status: AC
  Administered 2015-11-22: 4 mg via INTRAVENOUS
  Filled 2015-11-22: qty 1

## 2015-11-22 MED ORDER — DIATRIZOATE MEGLUMINE & SODIUM 66-10 % PO SOLN
15.0000 mL | Freq: Once | ORAL | Status: AC
  Administered 2015-11-22: 15 mL via ORAL

## 2015-11-22 MED ORDER — SODIUM CHLORIDE 0.9 % IV BOLUS (SEPSIS)
1000.0000 mL | Freq: Once | INTRAVENOUS | Status: AC
  Administered 2015-11-22: 1000 mL via INTRAVENOUS

## 2015-11-22 MED ORDER — OXYCODONE-ACETAMINOPHEN 5-325 MG PO TABS
1.0000 | ORAL_TABLET | Freq: Once | ORAL | Status: AC
  Administered 2015-11-22: 1 via ORAL
  Filled 2015-11-22: qty 1

## 2015-11-22 MED ORDER — ONDANSETRON HCL 4 MG/2ML IJ SOLN
4.0000 mg | Freq: Once | INTRAMUSCULAR | Status: AC
  Administered 2015-11-22: 4 mg via INTRAVENOUS
  Filled 2015-11-22: qty 2

## 2015-11-22 NOTE — ED Provider Notes (Signed)
[facility] Emergency Department Provider Note   ____________________________________________   First MD Initiated Contact with Patient 11/22/15 1002     (approximate)  I have reviewed the triage vital signs and the nursing notes.   HISTORY  Chief Complaint Abdominal Pain   HPI Carlyle Lipaeresa A Jungbluth is a 50 y.o. female with a history of chronic back pain and hypertension who is presenting to the emergency department with sudden onset left lower quadrant abdominal pain which woke her from sleep at 3 AM. She says she has had nausea but no vomiting. No diarrhea. No urinary symptoms. Says the pain is a 9 out of 10 and sharp. The pain is been constant.   Past Medical History:  Diagnosis Date  . Chronic back pain   . Hypertension     There are no active problems to display for this patient.   History reviewed. No pertinent surgical history.  Prior to Admission medications   Medication Sig Start Date End Date Taking? Authorizing Provider  cyclobenzaprine (FLEXERIL) 10 MG tablet Take 1 tablet (10 mg total) by mouth 3 (three) times daily as needed for muscle spasms. 09/07/15   Jami L Hagler, PA-C  ibuprofen (ADVIL,MOTRIN) 200 MG tablet Take 800 mg by mouth every 6 (six) hours as needed for headache or mild pain.    Historical Provider, MD  oxyCODONE-acetaminophen (ROXICET) 5-325 MG tablet Take 1-2 tablets by mouth every 6 (six) hours as needed. 11/22/15   Myrna Blazeravid Matthew Schaevitz, MD  predniSONE (DELTASONE) 10 MG tablet Take a daily regimen of 6,5,4,3,2,1 09/07/15   Jami L Hagler, PA-C  traMADol (ULTRAM) 50 MG tablet Take 1 tablet (50 mg total) by mouth every 6 (six) hours as needed. 09/07/15   Jami L Hagler, PA-C    Allergies Review of patient's allergies indicates no known allergies.  No family history on file.  Social History Social History  Substance Use Topics  . Smoking status: Never Smoker  . Smokeless tobacco: Not on file  . Alcohol use No    Review  of Systems Constitutional: No fever/chills Eyes: No visual changes. ENT: No sore throat. Cardiovascular: Denies chest pain. Respiratory: Denies shortness of breath. Gastrointestinal: no vomiting.  No diarrhea.  No constipation. Genitourinary: Negative for dysuria. Musculoskeletal: Negative for back pain. Skin: Negative for rash. Neurological: Negative for headaches, focal weakness or numbness.  10-point ROS otherwise negative.  ____________________________________________   PHYSICAL EXAM:  VITAL SIGNS: ED Triage Vitals  Enc Vitals Group     BP 11/22/15 0909 122/67     Pulse Rate 11/22/15 0909 89     Resp 11/22/15 0909 18     Temp 11/22/15 0909 98.1 F (36.7 C)     Temp Source 11/22/15 0909 Oral     SpO2 11/22/15 0909 99 %     Weight 11/22/15 0909 235 lb (106.6 kg)     Height 11/22/15 0909 5\' 8"  (1.727 m)     Head Circumference --      Peak Flow --      Pain Score 11/22/15 0910 10     Pain Loc --      Pain Edu? --      Excl. in GC? --     Constitutional: Alert and oriented. Well appearing and in no acute distress. Eyes: Conjunctivae are normal. PERRL. EOMI. Head: Atraumatic. Nose: No congestion/rhinnorhea. Mouth/Throat: Mucous membranes are moist.   Neck: No stridor.   Cardiovascular: Normal rate, regular rhythm. Grossly normal heart sounds.  Respiratory:  Normal respiratory effort.  No retractions. Lungs CTAB. Gastrointestinal: Soft With left lower quadrant tenderness to palpation which is moderate. No distention. Musculoskeletal: No lower extremity tenderness nor edema.  No joint effusions. Neurologic:  Normal speech and language. No gross focal neurologic deficits are appreciated.  Skin:  Skin is warm, dry and intact. No rash noted. Psychiatric: Mood and affect are normal. Speech and behavior are normal.  ____________________________________________   LABS (all labs ordered are listed, but only abnormal results are displayed)  Labs Reviewed  CBC - Abnormal;  Notable for the following:       Result Value   RDW 14.9 (*)    All other components within normal limits  URINALYSIS COMPLETEWITH MICROSCOPIC ([facility] ONLY) - Abnormal; Notable for the following:    Color, Urine YELLOW (*)    APPearance CLEAR (*)    Squamous Epithelial / LPF 0-5 (*)    All other components within normal limits  LIPASE, BLOOD  COMPREHENSIVE METABOLIC PANEL  POC URINE PREG, ED  POCT PREGNANCY, URINE   ____________________________________________  EKG   ____________________________________________  RADIOLOGY  CT Abdomen Pelvis W Contrast (Accession [id number]) (Order [id number])  Imaging  Date: 11/22/2015 Department: [facility] EMERGENCY DEPARTMENT Released By/Authorizing: Myrna Blazer, MD (auto-released)  PACS Images   Show images for CT Abdomen Pelvis W Contrast  Study Result   CLINICAL DATA:  Pt. C/o LLQ abd pain starting at 3am. Denies N/V/D or fever. of Isovue 300 used. No hx of surg.  EXAM: CT ABDOMEN AND PELVIS WITH CONTRAST  TECHNIQUE: Multidetector CT imaging of the abdomen and pelvis was performed using the standard protocol following bolus administration of intravenous contrast.  CONTRAST:  ISOVUE-300 IOPAMIDOL (ISOVUE-300) INJECTION 61%  COMPARISON:  None.  FINDINGS: Lung bases: Minor dependent subsegmental atelectasis and/ or scarring. Otherwise clear. Heart normal size.  Hepatobiliary: Fatty infiltration of the liver. No liver mass or focal lesion. Gallbladder surgically absent. No bile duct dilation.  Spleen, pancreas, adrenal glands:  Normal.  Kidneys, ureters, bladder:  Normal.  Uterus and adnexa: Uterus is mildly enlarged. There is a mass bulging from the left upper uterine segment and fundus measuring approximately 5 cm in size consistent with a fibroid. No adnexal masses.  Lymph nodes:  No adenopathy.  Ascites:  None.  Vascular:  Unremarkable.  Gastrointestinal:   Unremarkable.  Normal appendix visualized.  Musculoskeletal: Degenerative changes of the lower thoracic and lower lumbar spine. No osteoblastic or osteolytic lesions.  IMPRESSION: 1. No acute findings. No findings to explain the patient's left lower quadrant pain. 2. 5 cm presumed uterine fibroid. 3. Hepatic steatosis. 4. Status post cholecystectomy. 5. Degenerative changes most evident at L4-L5.   Electronically Signed   By: Amie Portland M.D.   On: 11/22/2015 14:39    ____________________________________________   PROCEDURES  Procedure(s) performed:   Procedures  Critical Care performed:   ____________________________________________   INITIAL IMPRESSION / ASSESSMENT AND PLAN / ED COURSE  Pertinent labs & imaging results that were available during my care of the patient were reviewed by me and considered in my medical decision making (see chart for details).  Patient with 5 cm uterine mass. The location of the masses consistent with the patient's pain. Possible movement of the mass causing discomfort. I discussed the imaging results with the patient as well as follow-up with OB/GYN. We discussed that it is likely a fibroid but this cannot be confirmed by the imaging obtained today. I discussed with her that although  the probability is low it is possible this could be a cancerous lesion. She'll be following up with OB/GYN. She understands plan and is willing to comply.  Clinical Course     ____________________________________________   FINAL CLINICAL IMPRESSION(S) / ED DIAGNOSES  Final diagnoses:  Uterine mass      NEW MEDICATIONS STARTED DURING THIS VISIT:  New Prescriptions   OXYCODONE-ACETAMINOPHEN (ROXICET) 5-325 MG TABLET    Take 1-2 tablets by mouth every 6 (six) hours as needed.     Note:  This document was prepared using Dragon voice recognition software and may include unintentional dictation errors.    Myrna Blazeravid Matthew Schaevitz,  MD 11/22/15 [phone]

## 2015-11-22 NOTE — ED Triage Notes (Signed)
Lower L abdominal pain began 3 am.

## 2015-12-16 MED ORDER — OXYCODONE-ACETAMINOPHEN 5-325 MG PO TABS
ORAL_TABLET | ORAL | Status: AC
  Filled 2015-12-16: qty 1

## 2016-03-31 ENCOUNTER — Ambulatory Visit: Payer: Self-pay | Attending: Oncology | Admitting: *Deleted

## 2016-03-31 ENCOUNTER — Ambulatory Visit
Admission: RE | Admit: 2016-03-31 | Discharge: 2016-03-31 | Disposition: A | Discharge status: Home / Self Care | Payer: Self-pay | Source: Ambulatory Visit | Attending: Oncology | Admitting: Oncology

## 2016-03-31 ENCOUNTER — Encounter: Payer: Self-pay | Admitting: *Deleted

## 2016-03-31 ENCOUNTER — Encounter (INDEPENDENT_AMBULATORY_CARE_PROVIDER_SITE_OTHER): Payer: Self-pay

## 2016-03-31 VITALS — BP 127/82 | HR 85 | Temp 97.2°F | Ht 66.93 in | Wt 249.0 lb

## 2016-03-31 DIAGNOSIS — Z0000 Encounter for general adult medical examination without abnormal findings: Secondary | ICD-10-CM | POA: Insufficient documentation

## 2016-03-31 NOTE — Patient Instructions (Signed)
Gave patient hand-out, Women Staying Healthy, Active and Well from [facility], with education on breast health, pap smears, heart and colon health. 

## 2016-03-31 NOTE — Progress Notes (Signed)
Subjective:     Patient ID: Ariel Davis, female   DOB: 12/03/1965, 50 y.o.   MRN: [id number]  HPI   Review of Systems     Objective:   Physical Exam  Pulmonary/Chest: Right breast exhibits no inverted nipple, no mass, no nipple discharge, no skin change and no tenderness. Left breast exhibits no inverted nipple, no mass, no nipple discharge, no skin change and no tenderness. Breasts are symmetrical.  Large pendulous breast  Abdominal: There is no splenomegaly or hepatomegaly.  Genitourinary: Rectal exam shows no external hemorrhoid. No labial fusion. There is no rash, tenderness, lesion or injury on the right labia. There is no rash, tenderness, lesion or injury on the left labia. Uterus is not deviated and not enlarged. Cervix exhibits no motion tenderness, no discharge and no friability. Right adnexum displays no mass, no tenderness and no fullness. Left adnexum displays no mass, no tenderness and no fullness. No erythema, tenderness or bleeding in the vagina. No foreign body in the vagina. No signs of injury around the vagina. No vaginal discharge found.         Assessment:     50 year [facility] Black [facility]  Clinical breast exam unremarkable.  Taught self breast awareness.  Specimen collected for pap smear.  Patient has been screened for eligibility.  She does not have any insurance, Medicare or Medicaid.  She also meets financial eligibility.  Hand-out given on the Affordable Care Act.    Plan:     Screening mammogram ordered.  Specimen for pap was sent to the lab.  Will follow-up per [facility] protocol.

## 2016-04-02 LAB — PAP LB AND HPV HIGH-RISK
HPV, high-risk: NEGATIVE
PAP SMEAR COMMENT: 0

## 2016-04-06 ENCOUNTER — Encounter: Payer: Self-pay | Admitting: *Deleted

## 2016-04-06 NOTE — Progress Notes (Signed)
Mailed letter to inform patient of her normal mammogram and HPV negative pap.  Next mammo due in 1 year and pap in 5 years.  [facility] to [place].

## 2016-05-14 ENCOUNTER — Emergency Department
Admission: EM | Admit: 2016-05-14 | Discharge: 2016-05-14 | Disposition: A | Discharge status: Home / Self Care | Payer: Self-pay | Attending: Emergency Medicine | Admitting: Emergency Medicine

## 2016-05-14 ENCOUNTER — Encounter: Payer: Self-pay | Admitting: Emergency Medicine

## 2016-05-14 DIAGNOSIS — M5432 Sciatica, left side: Secondary | ICD-10-CM

## 2016-05-14 DIAGNOSIS — M5442 Lumbago with sciatica, left side: Secondary | ICD-10-CM | POA: Insufficient documentation

## 2016-05-14 DIAGNOSIS — M5441 Lumbago with sciatica, right side: Secondary | ICD-10-CM | POA: Insufficient documentation

## 2016-05-14 DIAGNOSIS — I10 Essential (primary) hypertension: Secondary | ICD-10-CM | POA: Insufficient documentation

## 2016-05-14 DIAGNOSIS — M5431 Sciatica, right side: Secondary | ICD-10-CM

## 2016-05-14 LAB — URINALYSIS, COMPLETE (UACMP) WITH MICROSCOPIC
Bacteria, UA: NONE SEEN
Bilirubin Urine: NEGATIVE
GLUCOSE, UA: NEGATIVE mg/dL
HGB URINE DIPSTICK: NEGATIVE
Ketones, ur: NEGATIVE mg/dL
Leukocytes, UA: NEGATIVE
NITRITE: NEGATIVE
PH: 5 (ref 5.0–8.0)
PROTEIN: NEGATIVE mg/dL
RBC / HPF: NONE SEEN RBC/hpf (ref 0–5)
SPECIFIC GRAVITY, URINE: 1.016 (ref 1.005–1.030)

## 2016-05-14 MED ORDER — PREDNISONE 10 MG PO TABS: 40.0000 mg | ORAL_TABLET | Freq: Every day | ORAL | 0 refills | Status: AC

## 2016-05-14 MED ORDER — KETOROLAC TROMETHAMINE 60 MG/2ML IM SOLN
60.0000 mg | Freq: Once | INTRAMUSCULAR | Status: AC
  Administered 2016-05-14: 60 mg via INTRAMUSCULAR
  Filled 2016-05-14: qty 2

## 2016-05-14 MED ORDER — MELOXICAM 15 MG PO TABS: 15.0000 mg | ORAL_TABLET | Freq: Every day | ORAL | 0 refills | Status: DC

## 2016-05-14 NOTE — ED Triage Notes (Signed)
Patient presents to ED via POV from home with c/o lower back and bilateral leg pain. Hx of chronic back pain. Patient denies injury. Ambulatory to triage. A&O x4.

## 2016-05-14 NOTE — ED Notes (Signed)
See triage note  States she is having lower back pain which is moving into both legs/knees  Denies any injury  States her pcp dx'd with sciatica  Ambulates slowly but no limp

## 2016-05-14 NOTE — ED Provider Notes (Signed)
[facility] Emergency Department Provider Note  ____________________________________________  Time seen: Approximately 2:15 PM  I have reviewed the triage vital signs and the nursing notes.   HISTORY  Chief Complaint Back Pain    HPI Ariel Davis is a 51 y.o. female that presents to the emergency department with bilateral low back pain that radiates down both of back legs. Patient states that pain is worse with movement. It is been difficult for patient to walk. Patient has some frequency of urination. Patient has had sciatica for about 1 year and this feels exactly the same that pain is greater in intensity. Patient sees primary care provider for sciatica and primary care provider will not give her strong pain medicines and so she needs to follow-up with pain clinic. Patient was just approved for charity care and is trying to get into [facility]. Patient denies fever, cough, shortness of breath, chest pain, nausea, vomiting, diarrhea, constipation,dysuria, frequency, bowel or bladder incontinence, saddle paresthesias.   Past Medical History:  Diagnosis Date  . Chronic back pain   . Hypertension     There are no active problems to display for this patient.   History reviewed. No pertinent surgical history.  Prior to Admission medications   Medication Sig Start Date End Date Taking? Authorizing Provider  ibuprofen (ADVIL,MOTRIN) 200 MG tablet Take 800 mg by mouth every 6 (six) hours as needed for headache or mild pain.    Historical Provider, MD  meloxicam (MOBIC) 15 MG tablet Take 1 tablet (15 mg total) by mouth daily. 05/14/16 05/14/17  Enid DerryAshley Rich Paprocki, PA-C  predniSONE (DELTASONE) 10 MG tablet Take 4 tablets (40 mg total) by mouth daily. 05/14/16 05/19/16  Enid DerryAshley Keaten Mashek, PA-C    Allergies Patient has no known allergies.  No family history on file.  Social History Social History  Substance Use Topics  . Smoking status: Never Smoker  .  Smokeless tobacco: Not on file  . Alcohol use No     Review of Systems  Constitutional: No fever/chills ENT: No upper respiratory complaints. Cardiovascular: No chest pain. Respiratory: No cough. No SOB. Gastrointestinal: No abdominal pain.  No nausea, no vomiting.  Genitourinary: Negative for dysuria. Skin: Negative for rash, abrasions, lacerations, ecchymosis. Neurological: Negative for headaches, numbness or tingling   ____________________________________________   PHYSICAL EXAM:  VITAL SIGNS: ED Triage Vitals  Enc Vitals Group     BP 05/14/16 1150 (!) 160/92     Pulse Rate 05/14/16 1150 67     Resp 05/14/16 1150 18     Temp 05/14/16 1150 98.2 F (36.8 C)     Temp Source 05/14/16 1150 Oral     SpO2 05/14/16 1150 99 %     Weight 05/14/16 1151 230 lb (104.3 kg)     Height 05/14/16 1151 5\' 7"  (1.702 m)     Head Circumference --      Peak Flow --      Pain Score 05/14/16 1151 10     Pain Loc --      Pain Edu? --      Excl. in GC? --      Constitutional: Alert and oriented. Well appearing and in no acute distress. Eyes: Conjunctivae are normal. PERRL. EOMI. Head: Atraumatic. ENT:      Ears:      Nose: No congestion/rhinnorhea.      Mouth/Throat: Mucous membranes are moist.  Neck: No stridor.   Cardiovascular: Normal rate, regular rhythm.  Good peripheral  circulation. Respiratory: Normal respiratory effort without tachypnea or retractions. Lungs CTAB. Good air entry to the bases with no decreased or absent breath sounds. Gastrointestinal: Bowel sounds 4 quadrants. Soft and nontender to palpation. No guarding or rigidity. No palpable masses. No distention. No CVA tenderness. Positive straight leg and cross leg raise. Negative Faber 4 test. Musculoskeletal: Full range of motion to all extremities. No gross deformities appreciated. Neurologic:  Normal speech and language. No gross focal neurologic deficits are appreciated.  Skin:  Skin is warm, dry and intact. No  rash noted. Psychiatric: Mood and affect are normal. Speech and behavior are normal. Patient exhibits appropriate insight and judgement.   ____________________________________________   LABS (all labs ordered are listed, but only abnormal results are displayed)  Labs Reviewed  URINALYSIS, COMPLETE (UACMP) WITH MICROSCOPIC - Abnormal; Notable for the following:       Result Value   Color, Urine YELLOW (*)    APPearance CLEAR (*)    Squamous Epithelial / LPF 0-5 (*)    All other components within normal limits   ____________________________________________  EKG   ____________________________________________  RADIOLOGY   No results found.  ____________________________________________    PROCEDURES  Procedure(s) performed:    Procedures    Medications  ketorolac (TORADOL) injection 60 mg (60 mg Intramuscular Given 05/14/16 1344)     ____________________________________________   INITIAL IMPRESSION / ASSESSMENT AND PLAN / ED COURSE  Pertinent labs & imaging results that were available during my care of the patient were reviewed by me and considered in my medical decision making (see chart for details).  Review of the [place] CSRS was performed in accordance of the [facility] prior to dispensing any controlled drugs.   Patient's diagnosis is consistent with sciatica. Vital signs and exam are reassuring. No indication of imaging at this time. No evidence of urinary tract infection on urinalysis. Symptoms improved after Toradol injection. Patient will be discharged home with prescriptions for Mobic. Patient is to follow up with PCP as directed. Patient is given ED precautions to return to the ED for any worsening or new symptoms.      ____________________________________________  FINAL CLINICAL IMPRESSION(S) / ED DIAGNOSES  Final diagnoses:  Bilateral sciatica      NEW MEDICATIONS STARTED DURING THIS VISIT:  Discharge Medication List as of 05/14/2016  3:04 PM     START taking these medications   Details  meloxicam (MOBIC) 15 MG tablet Take 1 tablet (15 mg total) by mouth daily., Starting Fri 05/14/2016, Until Sat 05/14/2017, Print            This chart was dictated using voice recognition software/Dragon. Despite best efforts to proofread, errors can occur which can change the meaning. Any change was purely unintentional.    Enid Derry, PA-C 05/14/16 1901    Emily Filbert, MD 05/15/16 [phone]

## 2016-06-03 ENCOUNTER — Emergency Department
Admission: EM | Admit: 2016-06-03 | Discharge: 2016-06-03 | Disposition: A | Discharge status: Home / Self Care | Payer: Self-pay | Attending: Emergency Medicine | Admitting: Emergency Medicine

## 2016-06-03 DIAGNOSIS — G8929 Other chronic pain: Secondary | ICD-10-CM | POA: Insufficient documentation

## 2016-06-03 DIAGNOSIS — I10 Essential (primary) hypertension: Secondary | ICD-10-CM | POA: Insufficient documentation

## 2016-06-03 DIAGNOSIS — M5441 Lumbago with sciatica, right side: Secondary | ICD-10-CM | POA: Insufficient documentation

## 2016-06-03 MED ORDER — ORPHENADRINE CITRATE 30 MG/ML IJ SOLN
60.0000 mg | INTRAMUSCULAR | Status: AC
  Administered 2016-06-03: 60 mg via INTRAMUSCULAR
  Filled 2016-06-03: qty 2

## 2016-06-03 MED ORDER — KETOROLAC TROMETHAMINE 10 MG PO TABS: 10.0000 mg | ORAL_TABLET | Freq: Three times a day (TID) | ORAL | 0 refills | Status: DC

## 2016-06-03 MED ORDER — ORPHENADRINE CITRATE ER 100 MG PO TB12: 100.0000 mg | ORAL_TABLET | Freq: Two times a day (BID) | ORAL | 0 refills | Status: DC | PRN

## 2016-06-03 MED ORDER — DICLOFENAC SODIUM 75 MG PO TBEC: 75.0000 mg | DELAYED_RELEASE_TABLET | Freq: Two times a day (BID) | ORAL | 1 refills | Status: DC

## 2016-06-03 MED ORDER — KETOROLAC TROMETHAMINE 60 MG/2ML IM SOLN
30.0000 mg | Freq: Once | INTRAMUSCULAR | Status: AC
  Administered 2016-06-03: 30 mg via INTRAMUSCULAR
  Filled 2016-06-03: qty 2

## 2016-06-03 NOTE — ED Notes (Signed)
Pt c/o of "sciatica" that started yesterday; pt states pain at 10/10 in the lower back.

## 2016-06-03 NOTE — Discharge Instructions (Addendum)
Your exam is essentially normal today. You have a flare of your low back pain with sciatic irritation. Take the prescription meds as directed. Apply ice to the lowe back as needed. Follow-up with your provider as scheduled. Return as needed.

## 2016-06-03 NOTE — ED Provider Notes (Signed)
[facility] Emergency Department Provider Note ____________________________________________  Time seen: 2025  I have reviewed the triage vital signs and the nursing notes.  HISTORY  Chief Complaint  Back Pain  HPI Ariel Davis is a 51 y.o. female visit to the ED for evaluation of recurrent and chronic low back pain with sciatic irritation. She describes pain to the bilateral hips and thighs. She denies any recent injury, accident, trauma. She was seen here about a week prior for the same complaint. She reports improvement of her symptoms following IM medication administration. She was discharged with prescription for Motrin and prednisone taper. She reports that she's completed both medications as prescribed. She reports onset of pain a today without bladder or bowel incontinence, foot drop, or leg weakness. She is in the process of being referred to pain management by her primary care provider.  Past Medical History:  Diagnosis Date  . Chronic back pain   . Hypertension     There are no active problems to display for this patient.   No past surgical history on file.  Prior to Admission medications   Medication Sig Start Date End Date Taking? Authorizing Provider  diclofenac (VOLTAREN) 75 MG EC tablet Take 1 tablet (75 mg total) by mouth 2 (two) times daily. 06/03/16   Rosha Cocker V Bacon Carrissa Taitano, PA-C  ibuprofen (ADVIL,MOTRIN) 200 MG tablet Take 800 mg by mouth every 6 (six) hours as needed for headache or mild pain.    Historical Provider, MD  ketorolac (TORADOL) 10 MG tablet Take 1 tablet (10 mg total) by mouth every 8 (eight) hours. 06/03/16   Jeffrey Graefe V Bacon Rebekah Zackery, PA-C  meloxicam (MOBIC) 15 MG tablet Take 1 tablet (15 mg total) by mouth daily. 05/14/16 05/14/17  Enid Derry, PA-C  orphenadrine (NORFLEX) 100 MG tablet Take 1 tablet (100 mg total) by mouth 2 (two) times daily as needed for muscle spasms. 06/03/16   Charlesetta Ivory Everli Rother, PA-C     Allergies Patient has no known allergies.  No family history on file.  Social History Social History  Substance Use Topics  . Smoking status: Never Smoker  . Smokeless tobacco: Not on file  . Alcohol use No    Review of Systems  Constitutional: Negative for fever. Cardiovascular: Negative for chest pain. Respiratory: Negative for shortness of breath. Gastrointestinal: Negative for abdominal pain, vomiting and diarrhea. Genitourinary: Negative for dysuria. Musculoskeletal: Positive for back pain. Skin: Negative for rash. Neurological: Negative for headaches, focal weakness or numbness. ____________________________________________  PHYSICAL EXAM:  VITAL SIGNS: ED Triage Vitals  Enc Vitals Group     BP 06/03/16 2008 (!) 168/108     Pulse Rate 06/03/16 2008 92     Resp 06/03/16 2008 18     Temp 06/03/16 2008 98.8 F (37.1 C)     Temp Source 06/03/16 2008 Oral     SpO2 06/03/16 2008 98 %     Weight 06/03/16 2008 240 lb (108.9 kg)     Height 06/03/16 2008 5\' 8"  (1.727 m)     Head Circumference --      Peak Flow --      Pain Score 06/03/16 2009 10     Pain Loc --      Pain Edu? --      Excl. in GC? --     Constitutional: Alert and oriented. Well appearing and in no distress. Head: Normocephalic and atraumatic. Eyes: Conjunctivae are normal. PERRL. Normal extraocular movements Cardiovascular: Normal rate, regular  rhythm. Normal distal pulses. Respiratory: Normal respiratory effort. No wheezes/rales/rhonchi. Gastrointestinal: Soft and nontender. No distention. Musculoskeletal: Normal spinal alignment without midline tenderness, spasm, deformity, step-off. Patient with normal flexion and extension of the lower leg. Nontender with normal range of motion in all extremities.  Neurologic: Nerves II through XII grossly intact. Normal toe dorsiflexion and foot eversion on exam. Normal gait without ataxia. Normal speech and language. No gross focal neurologic deficits are  appreciated. Skin:  Skin is warm, dry and intact. No rash noted. Psychiatric: Mood and affect are normal. Patient exhibits appropriate insight and judgment. ___________________________________________  PROCEDURES  Toradol 30 mg IM Norflex 60 mg IM ____________________________________________  INITIAL IMPRESSION / ASSESSMENT AND PLAN / ED COURSE  Patient presents with acute flare of her chronic low back pain and right-sided sciatic symptoms. She reports near resolution of her symptoms at the time of discharge. She is discharged with prescriptions for Toradol to dose for acute pain over the next 5 days. She will then transition to Voltaren for daily anti-inflammatory benefit. She is also provided with a prescription of Norflex to dose for muscle spasm and pain as needed. She will follow up with the primary care provider for ongoing symptom management. Return precautions are reviewed. ____________________________________________  FINAL CLINICAL IMPRESSION(S) / ED DIAGNOSES  Final diagnoses:  Chronic bilateral low back pain with right-sided sciatica     Lissa HoardJenise V Bacon Xitlalli Newhard, PA-C 06/04/16 0147    Phineas SemenGraydon Goodman, MD 06/04/16 1407

## 2016-06-03 NOTE — ED Triage Notes (Signed)
Pt hx of back pain hx of the same, was seen here for the same 1 week ago and told it sciatica. States symptoms did improve but now it is persistent.

## 2016-06-03 NOTE — ED Notes (Signed)

## 2016-06-21 DIAGNOSIS — I10 Essential (primary) hypertension: Secondary | ICD-10-CM | POA: Insufficient documentation

## 2016-08-10 ENCOUNTER — Emergency Department: Payer: Medicaid Other

## 2016-08-10 ENCOUNTER — Emergency Department
Admission: EM | Admit: 2016-08-10 | Discharge: 2016-08-11 | Disposition: A | Discharge status: Other Acute Inpatient Hospital | Payer: Medicaid Other | Attending: Emergency Medicine | Admitting: Emergency Medicine

## 2016-08-10 ENCOUNTER — Encounter: Payer: Self-pay | Admitting: Emergency Medicine

## 2016-08-10 DIAGNOSIS — W109XXA Fall (on) (from) unspecified stairs and steps, initial encounter: Secondary | ICD-10-CM | POA: Diagnosis not present

## 2016-08-10 DIAGNOSIS — I10 Essential (primary) hypertension: Secondary | ICD-10-CM | POA: Diagnosis not present

## 2016-08-10 DIAGNOSIS — Y929 Unspecified place or not applicable: Secondary | ICD-10-CM | POA: Insufficient documentation

## 2016-08-10 DIAGNOSIS — S32422A Displaced fracture of posterior wall of left acetabulum, initial encounter for closed fracture: Secondary | ICD-10-CM | POA: Diagnosis not present

## 2016-08-10 DIAGNOSIS — Y939 Activity, unspecified: Secondary | ICD-10-CM | POA: Diagnosis not present

## 2016-08-10 DIAGNOSIS — Z79899 Other long term (current) drug therapy: Secondary | ICD-10-CM | POA: Diagnosis not present

## 2016-08-10 DIAGNOSIS — Y999 Unspecified external cause status: Secondary | ICD-10-CM | POA: Diagnosis not present

## 2016-08-10 DIAGNOSIS — Z791 Long term (current) use of non-steroidal anti-inflammatories (NSAID): Secondary | ICD-10-CM | POA: Diagnosis not present

## 2016-08-10 DIAGNOSIS — S32402A Unspecified fracture of left acetabulum, initial encounter for closed fracture: Secondary | ICD-10-CM

## 2016-08-10 DIAGNOSIS — S79912A Unspecified injury of left hip, initial encounter: Secondary | ICD-10-CM | POA: Diagnosis present

## 2016-08-10 HISTORY — DX: Sciatica, unspecified side: M54.30

## 2016-08-10 HISTORY — DX: Personal history of other diseases of the musculoskeletal system and connective tissue: Z87.39

## 2016-08-10 LAB — CBC WITH DIFFERENTIAL/PLATELET
BASOS ABS: 0.1 10*3/uL (ref 0–0.1)
BASOS PCT: 1 %
Eosinophils Absolute: 0.1 10*3/uL (ref 0–0.7)
Eosinophils Relative: 3 %
HEMATOCRIT: 39.7 % (ref 35.0–47.0)
HEMOGLOBIN: 12.7 g/dL (ref 12.0–16.0)
LYMPHS PCT: 28 %
Lymphs Abs: 1.5 10*3/uL (ref 1.0–3.6)
MCH: 26.8 pg (ref 26.0–34.0)
MCHC: 32.1 g/dL (ref 32.0–36.0)
MCV: 83.7 fL (ref 80.0–100.0)
Monocytes Absolute: 0.6 10*3/uL (ref 0.2–0.9)
Monocytes Relative: 10 %
NEUTROS ABS: 3.2 10*3/uL (ref 1.4–6.5)
NEUTROS PCT: 58 %
Platelets: 239 10*3/uL (ref 150–440)
RBC: 4.74 MIL/uL (ref 3.80–5.20)
RDW: 14.9 % — ABNORMAL HIGH (ref 11.5–14.5)
WBC: 5.5 10*3/uL (ref 3.6–11.0)

## 2016-08-10 LAB — BASIC METABOLIC PANEL
ANION GAP: 7 (ref 5–15)
BUN: 20 mg/dL (ref 6–20)
CALCIUM: 9.5 mg/dL (ref 8.9–10.3)
CO2: 29 mmol/L (ref 22–32)
Chloride: 105 mmol/L (ref 101–111)
Creatinine, Ser: 0.76 mg/dL (ref 0.44–1.00)
Glucose, Bld: 95 mg/dL (ref 65–99)
POTASSIUM: 3.7 mmol/L (ref 3.5–5.1)
Sodium: 141 mmol/L (ref 135–145)

## 2016-08-10 LAB — URINALYSIS, COMPLETE (UACMP) WITH MICROSCOPIC
BACTERIA UA: NONE SEEN
BILIRUBIN URINE: NEGATIVE
Glucose, UA: NEGATIVE mg/dL
HGB URINE DIPSTICK: NEGATIVE
Ketones, ur: NEGATIVE mg/dL
LEUKOCYTES UA: NEGATIVE
NITRITE: NEGATIVE
PROTEIN: NEGATIVE mg/dL
Specific Gravity, Urine: 1.024 (ref 1.005–1.030)
pH: 5 (ref 5.0–8.0)

## 2016-08-10 LAB — POCT PREGNANCY, URINE: PREG TEST UR: NEGATIVE

## 2016-08-10 MED ORDER — ACETAMINOPHEN 500 MG PO TABS
ORAL_TABLET | ORAL | Status: AC
  Administered 2016-08-10: 1000 mg via ORAL
  Filled 2016-08-10: qty 2

## 2016-08-10 MED ORDER — HYDROMORPHONE HCL 1 MG/ML IJ SOLN
INTRAMUSCULAR | Status: AC
  Administered 2016-08-10: 1 mg via INTRAVENOUS
  Filled 2016-08-10: qty 1

## 2016-08-10 MED ORDER — HYDROMORPHONE HCL 1 MG/ML IJ SOLN
1.0000 mg | Freq: Once | INTRAMUSCULAR | Status: AC
Start: 2016-08-10 — End: 2016-08-10
  Administered 2016-08-10: 1 mg via INTRAVENOUS

## 2016-08-10 MED ORDER — ACETAMINOPHEN 500 MG PO TABS
1000.0000 mg | ORAL_TABLET | Freq: Once | ORAL | Status: AC
  Administered 2016-08-10: 1000 mg via ORAL

## 2016-08-10 NOTE — ED Provider Notes (Signed)
[facility] Emergency Department Provider Note  ____________________________________________  Time seen: Approximately 11:52 PM  I have reviewed the triage vital signs and the nursing notes.   HISTORY  Chief Complaint Fall (left hip pain)    HPI Ariel Davis is a 51 y.o. female who complains of left hip pain after a fall off of a few steps. She did not hit her head or get knocked out but landed on her left hip and has left hip pain. Denies any other pain or injuries. Pain is severe, constant since injury. Worse with movement, no alleviating factors. Nonradiating. Was given 100 g of fentanyl by EMS prior to arrival. No paresthesias.     Past Medical History:  Diagnosis Date  . Chronic back pain   . H/O degenerative disc disease   . Hypertension   . Sciatica      There are no active problems to display for this patient.    No past surgical history on file.   Prior to Admission medications   Medication Sig Start Date End Date Taking? Authorizing Provider  amLODipine (NORVASC) 5 MG tablet Take 5 mg by mouth daily. 07/12/16  Yes [provider]  diclofenac (VOLTAREN) 75 MG EC tablet Take 1 tablet (75 mg total) by mouth 2 (two) times daily. 06/03/16  Yes Menshew, Charlesetta Ivory, PA-C  ibuprofen (ADVIL,MOTRIN) 200 MG tablet Take 800 mg by mouth every 6 (six) hours as needed for headache or mild pain.   Yes [provider]  ketorolac (TORADOL) 10 MG tablet Take 1 tablet (10 mg total) by mouth every 8 (eight) hours. 06/03/16  Yes Menshew, Charlesetta Ivory, PA-C  lisinopril-hydrochlorothiazide (PRINZIDE,ZESTORETIC) 20-12.5 MG tablet Take 1 tablet by mouth daily. 07/12/16  Yes [provider]  meloxicam (MOBIC) 15 MG tablet Take 1 tablet (15 mg total) by mouth daily. 05/14/16 05/14/17 Yes Enid Derry, PA-C  orphenadrine (NORFLEX) 100 MG tablet Take 1 tablet (100 mg total) by mouth 2 (two) times daily as needed for muscle spasms. 06/03/16   Yes Menshew, Charlesetta Ivory, PA-C     Allergies Patient has no known allergies.   No family history on file.  Social History Social History  Substance Use Topics  . Smoking status: Never Smoker  . Smokeless tobacco: Not on file  . Alcohol use No    Review of Systems  Constitutional:   No fever or chills.  ENT:   No sore throat. No rhinorrhea. Lymphatic: No swollen glands, No extremity swelling Endocrine: No hot/cold flashes. No significant weight change. No neck swelling. Cardiovascular:   No chest pain or syncope. Respiratory:   No dyspnea or cough. Gastrointestinal:   Negative for abdominal pain, vomiting and diarrhea.  Genitourinary:   Negative for dysuria or difficulty urinating. Musculoskeletal:   Left hip pain. No back pain. Neurological:   Negative for headaches or weakness. All other systems reviewed and are negative except as documented above in ROS and HPI.  ____________________________________________   PHYSICAL EXAM:  VITAL SIGNS: ED Triage Vitals  Enc Vitals Group     BP 08/10/16 1745 (!) 143/78     Pulse Rate 08/10/16 1745 72     Resp 08/10/16 1749 16     Temp 08/10/16 1749 98.1 F (36.7 C)     Temp Source 08/10/16 1749 Oral     SpO2 08/10/16 1745 97 %     Weight 08/10/16 1749 240 lb (108.9 kg)     Height 08/10/16 1749 5'  7" (1.702 m)     Head Circumference --      Peak Flow --      Pain Score 08/10/16 1745 4     Pain Loc --      Pain Edu? --      Excl. in GC? --     Vital signs reviewed, nursing assessments reviewed.   Constitutional:   Alert and oriented. Well appearing and in no distress. Eyes:   No scleral icterus. No conjunctival pallor. PERRL. EOMI.  No nystagmus. ENT   Head:   Normocephalic and atraumatic.   Nose:   No congestion/rhinnorhea. No septal hematoma   Mouth/Throat:   MMM, no pharyngeal erythema. No peritonsillar mass.    Neck:   No stridor. No SubQ emphysema. No  meningismus. Hematological/Lymphatic/Immunilogical:   No cervical lymphadenopathy. Cardiovascular:   RRR. Symmetric bilateral radial and DP pulses.  No murmurs.  Respiratory:   Normal respiratory effort without tachypnea nor retractions. Breath sounds are clear and equal bilaterally. No wheezes/rales/rhonchi. Gastrointestinal:   Soft and nontender. Non distended. There is no CVA tenderness.  No rebound, rigidity, or guarding. Genitourinary:   deferred Musculoskeletal:   Left lower extremity is shortened compared to the right, rotated. Severe pain with hip flexion and movement.. Neurologic:   Normal speech and language.  CN 2-10 normal. Motor grossly intact. No gross focal neurologic deficits are appreciated.  Skin:    Skin is warm, dry and intact. No rash noted.  No petechiae, purpura, or bullae.  ____________________________________________    LABS (pertinent positives/negatives) (all labs ordered are listed, but only abnormal results are displayed) Labs Reviewed  CBC WITH DIFFERENTIAL/PLATELET - Abnormal; Notable for the following:       Result Value   RDW 14.9 (*)    All other components within normal limits  URINALYSIS, COMPLETE (UACMP) WITH MICROSCOPIC - Abnormal; Notable for the following:    Color, Urine YELLOW (*)    APPearance HAZY (*)    Squamous Epithelial / LPF 0-5 (*)    All other components within normal limits  BASIC METABOLIC PANEL  POC URINE PREG, ED  POCT PREGNANCY, URINE   ____________________________________________   EKG    ____________________________________________    RADIOLOGY  Ct Pelvis Wo Contrast  Result Date: 08/10/2016 CLINICAL DATA:  Status post two foot fall from steps onto left side. Initial encounter. EXAM: CT PELVIS WITHOUT CONTRAST TECHNIQUE: Multidetector CT imaging of the pelvis was performed following the standard protocol without intravenous contrast. COMPARISON:  CT of the abdomen and pelvis performed 11/22/2015 FINDINGS: Urinary  Tract: The bladder is mildly distended. Mild bladder wall thickening, particularly on the right, is nonspecific. Cystoscopy could be considered for further evaluation. Bowel: Visualized small and large bowel loops are grossly unremarkable. The appendix is normal in caliber, without evidence of appendicitis. Vascular/Lymphatic: The visualized distal abdominal aorta and its branches are grossly unremarkable in appearance. No retroperitoneal or pelvic sidewall lymphadenopathy is seen. Reproductive: A likely small uterine fibroid is noted. The uterus is otherwise grossly unremarkable. No suspicious adnexal masses are seen. The ovaries appear grossly symmetric. Other:  No additional soft tissue abnormalities are seen. Musculoskeletal: There is a minimally displaced transverse with posterior wall fracture through the left acetabulum, with comminution at the posterior wall. No additional fractures are seen. Degenerative change is noted at the pubic symphysis, with associated sclerosis and subcortical cysts. Facet disease is noted at the lower lumbar spine. IMPRESSION: 1. Minimally displaced transverse with posterior wall fracture through the left acetabulum,  with comminution at the posterior wall. 2. Mild nonspecific right-sided bladder wall thickening. Would correlate for any associated symptoms. Cystoscopy could be considered for further evaluation, as deemed clinically appropriate. 3. Likely small uterine fibroid noted. Electronically Signed   By: Roanna Raider M.D.   On: 08/10/2016 21:39   Dg Hip Unilat W Or Wo Pelvis 2-3 Views Left  Result Date: 08/10/2016 CLINICAL DATA:  Larey Seat down steps, approximately 2 feet.  Pain. EXAM: DG HIP (WITH OR WITHOUT PELVIS) 2-3V LEFT COMPARISON:  None. FINDINGS: There is an acute fracture of the acetabulum, mildly displaced, which could have multiple components. Consider further evaluation with CT. Osteitis pubis. No other pelvic or sacral fractures are definitely identified. Pelvic  hematoma is suspected. IMPRESSION: Acute fracture of the LEFT acetabulum, mildly displaced, which could have multiple components. CT of the pelvis recommended for further evaluation. Electronically Signed   By: Elsie Stain M.D.   On: 08/10/2016 19:00    ____________________________________________   PROCEDURES Procedures  ____________________________________________   INITIAL IMPRESSION / ASSESSMENT AND PLAN / ED COURSE  Pertinent labs & imaging results that were available during my care of the patient were reviewed by me and considered in my medical decision making (see chart for details).    Clinical Course as of Aug 11 45  Tue Aug 10, 2016  1758 P/w likely L hip fx or dislocation after fall off steps. Will check xray.  [PS]  2039 D/w ortho [facility]. Rec. CT pelvis to guide further management decisions and possible need for transfer.  [PS]  2320 D/w [facility]. Rec transfer for possible surgical repair of acetab. Given potential for instability and worsening injury to pelvis  [PS]  2338 D/w [facility] Trauma Dr. Derrell Lolling. He advises that since this is isolated ortho injury, I should discuss with ortho for primary admission to their service.  I discussed with Ortho Dr. Leroy Libman, who refuses the transfer and tells me the patient should be admitted to [facility] overnight so that someone can talk to ortho Dr. Carola Frost tomorrow to request transfer to his service.  [PS]  Wed Aug 11, 2016  0033 Accepted for transfer to [facility] ED by ortho/trauma Dr. Barron Schmid.   [PS]    Clinical Course User Index [PS] Sharman Cheek, MD     ____________________________________________   FINAL CLINICAL IMPRESSION(S) / ED DIAGNOSES  Final diagnoses:  Closed displaced fracture of left acetabulum, unspecified portion of acetabulum, initial encounter [facility])      New Prescriptions   No medications on file     Portions of this note were generated with dragon dictation software. Dictation errors may occur despite best  attempts at proofreading.    Sharman Cheek, MD 08/11/16 [phone]

## 2016-08-10 NOTE — ED Notes (Signed)
Pt denies hitting her head in her fall. Pt stating some tingling in her left foot. Pt has +2 palpable pulses. Pt in NAD and is resting with her eyes closed.

## 2016-08-10 NOTE — ED Notes (Signed)
Pt in NAD. Pt was helped onto bedpan by nurse. Pt tolerated well. Pt was able to roll on either side. Pt given warm blanket.

## 2016-08-10 NOTE — ED Notes (Signed)
Patient transported to CT 

## 2016-08-10 NOTE — ED Triage Notes (Signed)
Fell down from steps, approximately 2 feet.  Fell onto left side.  Arrives by EMS.  20 g saline lock to RAC.  100 mcg fentanyl given PTA.

## 2016-08-10 NOTE — ED Notes (Signed)
Returned from xray

## 2016-08-11 ENCOUNTER — Ambulatory Visit (HOSPITAL_COMMUNITY)
Admission: AD | Admit: 2016-08-11 | Discharge: 2016-08-11 | Disposition: A | Discharge status: Home / Self Care | Payer: Medicaid Other | Source: Other Acute Inpatient Hospital | Attending: Emergency Medicine | Admitting: Emergency Medicine

## 2016-08-11 DIAGNOSIS — S32402A Unspecified fracture of left acetabulum, initial encounter for closed fracture: Secondary | ICD-10-CM | POA: Diagnosis present

## 2016-08-11 DIAGNOSIS — W109XXA Fall (on) (from) unspecified stairs and steps, initial encounter: Secondary | ICD-10-CM | POA: Diagnosis not present

## 2016-08-11 MED ORDER — SODIUM CHLORIDE 0.9 % IV BOLUS (SEPSIS)
1000.0000 mL | Freq: Once | INTRAVENOUS | Status: AC
  Administered 2016-08-11: 1000 mL via INTRAVENOUS

## 2016-08-11 MED ORDER — HYDROMORPHONE HCL 1 MG/ML IJ SOLN
1.0000 mg | INTRAMUSCULAR | Status: AC
  Administered 2016-08-11: 1 mg via INTRAVENOUS
  Filled 2016-08-11: qty 1

## 2016-08-11 MED ORDER — ONDANSETRON HCL 4 MG/2ML IJ SOLN
4.0000 mg | INTRAMUSCULAR | Status: AC
  Administered 2016-08-11: 4 mg via INTRAVENOUS
  Filled 2016-08-11: qty 2

## 2016-08-11 NOTE — ED Provider Notes (Signed)
-----------------------------------------   2:36 AM on 08/11/2016 -----------------------------------------  [facility] is here to transport the pat is well controlled and she is hemodynamically stable. she has no other complaints or questions upon my reexamination at this time.  I updated the EMTALA transfer document.   Loleta RoseForbach, Deonta Bomberger, MD 08/11/16 [phone]

## 2016-08-23 DIAGNOSIS — S32422A Displaced fracture of posterior wall of left acetabulum, initial encounter for closed fracture: Secondary | ICD-10-CM | POA: Insufficient documentation

## 2016-08-27 ENCOUNTER — Emergency Department
Admission: EM | Admit: 2016-08-27 | Discharge: 2016-08-27 | Disposition: A | Discharge status: Home / Self Care | Payer: Medicaid Other | Attending: Emergency Medicine | Admitting: Emergency Medicine

## 2016-08-27 ENCOUNTER — Encounter: Payer: Self-pay | Admitting: Emergency Medicine

## 2016-08-27 ENCOUNTER — Emergency Department: Payer: Medicaid Other

## 2016-08-27 DIAGNOSIS — M898X4 Other specified disorders of bone, hand: Secondary | ICD-10-CM | POA: Insufficient documentation

## 2016-08-27 DIAGNOSIS — Z8781 Personal history of (healed) traumatic fracture: Secondary | ICD-10-CM | POA: Diagnosis not present

## 2016-08-27 DIAGNOSIS — Z79899 Other long term (current) drug therapy: Secondary | ICD-10-CM | POA: Diagnosis not present

## 2016-08-27 DIAGNOSIS — S32415A Nondisplaced fracture of anterior wall of left acetabulum, initial encounter for closed fracture: Secondary | ICD-10-CM | POA: Insufficient documentation

## 2016-08-27 DIAGNOSIS — M79641 Pain in right hand: Secondary | ICD-10-CM | POA: Diagnosis present

## 2016-08-27 DIAGNOSIS — S32402D Unspecified fracture of left acetabulum, subsequent encounter for fracture with routine healing: Secondary | ICD-10-CM

## 2016-08-27 DIAGNOSIS — M898X9 Other specified disorders of bone, unspecified site: Secondary | ICD-10-CM

## 2016-08-27 DIAGNOSIS — M25552 Pain in left hip: Secondary | ICD-10-CM | POA: Insufficient documentation

## 2016-08-27 DIAGNOSIS — M1711 Unilateral primary osteoarthritis, right knee: Secondary | ICD-10-CM | POA: Insufficient documentation
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
## 2017-01-17 ENCOUNTER — Emergency Department
Admission: EM | Admit: 2017-01-17 | Discharge: 2017-01-17 | Disposition: A | Discharge status: Home / Self Care | Payer: Medicaid Other | Attending: Emergency Medicine | Admitting: Emergency Medicine

## 2017-01-17 DIAGNOSIS — I10 Essential (primary) hypertension: Secondary | ICD-10-CM | POA: Insufficient documentation

## 2017-01-17 DIAGNOSIS — R252 Cramp and spasm: Secondary | ICD-10-CM | POA: Diagnosis not present

## 2017-01-17 DIAGNOSIS — M5431 Sciatica, right side: Secondary | ICD-10-CM | POA: Insufficient documentation

## 2017-01-17 DIAGNOSIS — M545 Low back pain: Secondary | ICD-10-CM | POA: Diagnosis present

## 2017-01-17 DIAGNOSIS — Z79899 Other long term (current) drug therapy: Secondary | ICD-10-CM | POA: Insufficient documentation

## 2017-01-17 DIAGNOSIS — M5432 Sciatica, left side: Secondary | ICD-10-CM | POA: Diagnosis not present

## 2017-01-17 LAB — BASIC METABOLIC PANEL
Anion gap: 9 (ref 5–15)
BUN: 25 mg/dL — AB (ref 6–20)
CHLORIDE: 104 mmol/L (ref 101–111)
CO2: 26 mmol/L (ref 22–32)
CREATININE: 1.01 mg/dL — AB (ref 0.44–1.00)
Calcium: 9.1 mg/dL (ref 8.9–10.3)
GFR calc Af Amer: >60 mL/min (ref >60)
GFR calc non Af Amer: >60 mL/min (ref >60)
Glucose, Bld: 93 mg/dL (ref 65–99)
Potassium: 4.3 mmol/L (ref 3.5–5.1)
SODIUM: 139 mmol/L (ref 135–145)

## 2017-01-17 MED ORDER — KETOROLAC TROMETHAMINE 30 MG/ML IJ SOLN
30.0000 mg | Freq: Once | INTRAMUSCULAR | Status: AC
  Administered 2017-01-17: 30 mg via INTRAVENOUS

## 2017-01-17 MED ORDER — KETOROLAC TROMETHAMINE 30 MG/ML IJ SOLN
30.0000 mg | Freq: Once | INTRAMUSCULAR | Status: DC
  Filled 2017-01-17: qty 1

## 2017-01-17 MED ORDER — DEXAMETHASONE SODIUM PHOSPHATE 10 MG/ML IJ SOLN
10.0000 mg | Freq: Once | INTRAMUSCULAR | Status: DC
  Filled 2017-01-17: qty 1

## 2017-01-17 MED ORDER — PREDNISONE 10 MG PO TABS: 10.0000 mg | ORAL_TABLET | Freq: Every day | ORAL | 0 refills | Status: DC

## 2017-01-17 MED ORDER — DEXAMETHASONE SODIUM PHOSPHATE 10 MG/ML IJ SOLN
10.0000 mg | Freq: Once | INTRAMUSCULAR | Status: AC
  Administered 2017-01-17: 10 mg via INTRAVENOUS

## 2017-01-17 NOTE — Discharge Instructions (Signed)
Please take prednisone taper as prescribed. Follow-up with orthopedist or primary care provider in one week for recheck. Return to the ED for any increasing pain, weakness, loss of bowel or bladder symptoms, worsening symptoms or changes in health.

## 2017-01-17 NOTE — ED Triage Notes (Signed)
Pt c/o chronic lower back pain that worsened over past 2 days. Reports bilateral leg, foot and hand pain also. Generalized body aches over past two days. Took prescribed norco at home this AM with no relief.   Pt alert and oriented X4, active, cooperative, pt in NAD. RR even and unlabored, color WNL.
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
## 2017-02-09 ENCOUNTER — Ambulatory Visit: Payer: Medicaid Other

## 2017-02-10 ENCOUNTER — Ambulatory Visit: Payer: Medicaid Other | Attending: Family Medicine

## 2017-02-10 DIAGNOSIS — M6281 Muscle weakness (generalized): Secondary | ICD-10-CM

## 2017-02-10 DIAGNOSIS — M25562 Pain in left knee: Secondary | ICD-10-CM | POA: Insufficient documentation

## 2017-02-10 DIAGNOSIS — M25561 Pain in right knee: Secondary | ICD-10-CM | POA: Insufficient documentation

## 2017-02-10 DIAGNOSIS — G8929 Other chronic pain: Secondary | ICD-10-CM | POA: Diagnosis present
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
## 2017-02-14 ENCOUNTER — Ambulatory Visit: Payer: Medicaid Other

## 2017-02-17 ENCOUNTER — Ambulatory Visit: Payer: Medicaid Other

## 2017-02-18 ENCOUNTER — Emergency Department
Admission: EM | Admit: 2017-02-18 | Discharge: 2017-02-18 | Disposition: A | Discharge status: Home / Self Care | Payer: Medicaid Other | Attending: Emergency Medicine | Admitting: Emergency Medicine

## 2017-02-18 ENCOUNTER — Emergency Department: Payer: Medicaid Other

## 2017-02-18 DIAGNOSIS — I10 Essential (primary) hypertension: Secondary | ICD-10-CM | POA: Diagnosis not present

## 2017-02-18 DIAGNOSIS — M542 Cervicalgia: Secondary | ICD-10-CM | POA: Insufficient documentation

## 2017-02-18 DIAGNOSIS — M545 Low back pain: Secondary | ICD-10-CM | POA: Insufficient documentation

## 2017-02-18 DIAGNOSIS — G8929 Other chronic pain: Secondary | ICD-10-CM | POA: Diagnosis not present

## 2017-02-18 DIAGNOSIS — Z79899 Other long term (current) drug therapy: Secondary | ICD-10-CM | POA: Insufficient documentation

## 2017-02-18 MED ORDER — CYCLOBENZAPRINE HCL 5 MG PO TABS: 5.0000 mg | ORAL_TABLET | Freq: Three times a day (TID) | ORAL | 0 refills | Status: AC | PRN

## 2017-02-18 MED ORDER — MELOXICAM 7.5 MG PO TABS: 7.5000 mg | ORAL_TABLET | Freq: Every day | ORAL | 1 refills | Status: AC

## 2017-02-18 MED ORDER — KETOROLAC TROMETHAMINE 30 MG/ML IJ SOLN
30.0000 mg | Freq: Once | INTRAMUSCULAR | Status: AC
  Administered 2017-02-18: 30 mg via INTRAMUSCULAR
  Filled 2017-02-18: qty 1
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
## 2017-02-21 ENCOUNTER — Emergency Department
Admission: EM | Admit: 2017-02-21 | Discharge: 2017-02-21 | Disposition: A | Discharge status: Home / Self Care | Payer: Medicaid Other | Attending: Emergency Medicine | Admitting: Emergency Medicine

## 2017-02-21 ENCOUNTER — Emergency Department: Payer: Medicaid Other

## 2017-02-21 ENCOUNTER — Encounter: Payer: Self-pay | Admitting: Emergency Medicine

## 2017-02-21 DIAGNOSIS — I10 Essential (primary) hypertension: Secondary | ICD-10-CM | POA: Insufficient documentation

## 2017-02-21 DIAGNOSIS — Z79899 Other long term (current) drug therapy: Secondary | ICD-10-CM | POA: Diagnosis not present

## 2017-02-21 DIAGNOSIS — M25512 Pain in left shoulder: Secondary | ICD-10-CM | POA: Diagnosis present

## 2017-02-21 DIAGNOSIS — M7532 Calcific tendinitis of left shoulder: Secondary | ICD-10-CM | POA: Diagnosis not present

## 2017-02-21 DIAGNOSIS — M542 Cervicalgia: Secondary | ICD-10-CM | POA: Diagnosis not present

## 2017-02-21 NOTE — ED Notes (Signed)
Pt ambulatory upon discharge. Pt verbalized understanding of discharge instructions, importance of follow-up care, pain management and importance of icing joint with range of motion. VSS. Pt A&O x4. Skin warm and dry.
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
## 2017-02-21 NOTE — ED Notes (Signed)
Patient transported to XR. 

## 2017-02-22 ENCOUNTER — Ambulatory Visit: Payer: Medicaid Other
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
## 2017-03-07 ENCOUNTER — Encounter: Payer: Self-pay | Admitting: Emergency Medicine

## 2017-03-07 ENCOUNTER — Emergency Department
Admission: EM | Admit: 2017-03-07 | Discharge: 2017-03-07 | Disposition: A | Discharge status: Home / Self Care | Payer: Medicaid Other | Attending: Emergency Medicine | Admitting: Emergency Medicine

## 2017-03-07 DIAGNOSIS — M25512 Pain in left shoulder: Secondary | ICD-10-CM | POA: Diagnosis present

## 2017-03-07 DIAGNOSIS — I10 Essential (primary) hypertension: Secondary | ICD-10-CM | POA: Diagnosis not present

## 2017-03-07 DIAGNOSIS — Z79899 Other long term (current) drug therapy: Secondary | ICD-10-CM | POA: Insufficient documentation

## 2017-03-07 DIAGNOSIS — S46912A Strain of unspecified muscle, fascia and tendon at shoulder and upper arm level, left arm, initial encounter: Secondary | ICD-10-CM

## 2017-03-07 MED ORDER — BACLOFEN 10 MG PO TABS: 10.0000 mg | ORAL_TABLET | Freq: Every day | ORAL | 1 refills | Status: DC

## 2017-03-07 MED ORDER — METHYLPREDNISOLONE 4 MG PO TBPK: ORAL_TABLET | ORAL | 0 refills | Status: DC
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
## 2017-03-07 NOTE — Discharge Instructions (Signed)
Follow-up with orthopedics for the left shoulder pain, take your medications as prescribed, apply ice to the left shoulder, orthopedics will be able to refer you to physical therapy if needed
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
## 2017-03-08 ENCOUNTER — Ambulatory Visit: Payer: Medicaid Other | Attending: Family Medicine

## 2017-03-08 DIAGNOSIS — M6281 Muscle weakness (generalized): Secondary | ICD-10-CM | POA: Diagnosis not present

## 2017-03-08 DIAGNOSIS — M25561 Pain in right knee: Secondary | ICD-10-CM | POA: Insufficient documentation

## 2017-03-08 DIAGNOSIS — G8929 Other chronic pain: Secondary | ICD-10-CM | POA: Diagnosis present

## 2017-03-08 DIAGNOSIS — M25562 Pain in left knee: Secondary | ICD-10-CM | POA: Insufficient documentation
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
## 2017-03-17 ENCOUNTER — Ambulatory Visit: Payer: Medicaid Other

## 2017-03-17 DIAGNOSIS — G8929 Other chronic pain: Secondary | ICD-10-CM

## 2017-03-17 DIAGNOSIS — M25562 Pain in left knee: Secondary | ICD-10-CM

## 2017-03-17 DIAGNOSIS — M6281 Muscle weakness (generalized): Secondary | ICD-10-CM

## 2017-03-17 DIAGNOSIS — M25561 Pain in right knee: Secondary | ICD-10-CM
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
## 2017-04-06 ENCOUNTER — Ambulatory Visit: Payer: Medicaid Other | Attending: Neurology

## 2017-04-06 DIAGNOSIS — Z0189 Encounter for other specified special examinations: Secondary | ICD-10-CM | POA: Diagnosis present

## 2017-04-06 DIAGNOSIS — F5101 Primary insomnia: Secondary | ICD-10-CM | POA: Insufficient documentation

## 2017-04-06 DIAGNOSIS — G4733 Obstructive sleep apnea (adult) (pediatric): Secondary | ICD-10-CM | POA: Insufficient documentation

## 2017-04-19 ENCOUNTER — Ambulatory Visit: Payer: Medicaid Other | Attending: Neurology

## 2017-04-19 DIAGNOSIS — Z0189 Encounter for other specified special examinations: Secondary | ICD-10-CM | POA: Diagnosis present

## 2017-05-12 DIAGNOSIS — G4733 Obstructive sleep apnea (adult) (pediatric): Secondary | ICD-10-CM | POA: Insufficient documentation

## 2017-05-17 ENCOUNTER — Encounter: Payer: Self-pay | Admitting: Emergency Medicine

## 2017-05-17 ENCOUNTER — Emergency Department
Admission: EM | Admit: 2017-05-17 | Discharge: 2017-05-17 | Disposition: A | Discharge status: Home / Self Care | Payer: Medicaid Other | Attending: Emergency Medicine | Admitting: Emergency Medicine

## 2017-05-17 ENCOUNTER — Other Ambulatory Visit: Payer: Self-pay

## 2017-05-17 DIAGNOSIS — I10 Essential (primary) hypertension: Secondary | ICD-10-CM | POA: Diagnosis not present

## 2017-05-17 DIAGNOSIS — G2581 Restless legs syndrome: Secondary | ICD-10-CM | POA: Diagnosis not present

## 2017-05-17 DIAGNOSIS — Z79899 Other long term (current) drug therapy: Secondary | ICD-10-CM | POA: Diagnosis not present

## 2017-05-17 DIAGNOSIS — G8929 Other chronic pain: Secondary | ICD-10-CM | POA: Insufficient documentation

## 2017-05-17 DIAGNOSIS — M545 Low back pain: Secondary | ICD-10-CM | POA: Diagnosis present

## 2017-05-17 DIAGNOSIS — M5440 Lumbago with sciatica, unspecified side: Secondary | ICD-10-CM | POA: Diagnosis not present

## 2017-05-17 MED ORDER — ROPINIROLE HCL 0.5 MG PO TABS: 0.5000 mg | ORAL_TABLET | Freq: Every day | ORAL | 3 refills | Status: AC

## 2017-05-17 MED ORDER — KETOROLAC TROMETHAMINE 30 MG/ML IJ SOLN
30.0000 mg | Freq: Once | INTRAMUSCULAR | Status: AC
  Administered 2017-05-17: 30 mg via INTRAMUSCULAR
  Filled 2017-05-17: qty 1

## 2017-05-17 NOTE — ED Notes (Signed)
First Nurse Note:  Complaining of back and leg pain for a long time, "bad episode last night".  Patient placed in WC.

## 2017-05-17 NOTE — ED Provider Notes (Signed)
[facility] Emergency Department Provider Note  ____________________________________________   First MD Initiated Contact with Patient 05/17/17 1137     (approximate)  I have reviewed the triage vital signs and the nursing notes.   HISTORY  Chief Complaint Back Pain    HPI Ariel Davis is a 52 y.o. female who complains of chronic back pain.  States pain is moving into her legs.  She has a history of restless leg syndrome.  She has not any medication for this.  She states they are just all drawn out last night and now her back is really hurting.  She has a prescription for hydrocodone from her regular doctor.  She is on a pain contract with them  Past Medical History:  Diagnosis Date  . Chronic back pain   . H/O degenerative disc disease   . Hypertension   . Sciatica     There are no active problems to display for this patient.   History reviewed. No pertinent surgical history.  Prior to Admission medications   Medication Sig Start Date End Date Taking? Authorizing Provider  amLODipine (NORVASC) 5 MG tablet Take 5 mg by mouth daily. 07/12/16   [provider]  gabapentin (NEURONTIN) 300 MG capsule Take 800 mg 3 (three) times daily by mouth.    [provider]  lisinopril-hydrochlorothiazide (PRINZIDE,ZESTORETIC) 20-12.5 MG tablet Take 1 tablet by mouth daily. 07/12/16   [provider]  methylPREDNISolone (MEDROL DOSEPAK) 4 MG TBPK tablet Take 6 pills on day one then decrease by 1 pill each day 03/07/17   Faythe Ghee, PA-C  rOPINIRole (REQUIP) 0.5 MG tablet Take 1 tablet (0.5 mg total) by mouth at bedtime. 05/17/17   Faythe Ghee, PA-C    Allergies Oxycodone  No family history on file.  Social History Social History   Tobacco Use  . Smoking status: Never Smoker  . Smokeless tobacco: Never Used  Substance Use Topics  . Alcohol use: No  . Drug use: No    Review of Systems  Constitutional: No  fever/chills Eyes: No visual changes. ENT: No sore throat. Respiratory: Denies cough Genitourinary: Negative for dysuria. Musculoskeletal: Positive for back pain.  Positive for leg pain Skin: Negative for rash.    ____________________________________________   PHYSICAL EXAM:  VITAL SIGNS: ED Triage Vitals  Enc Vitals Group     BP 05/17/17 1104 (!) 126/93     Pulse Rate 05/17/17 1104 88     Resp 05/17/17 1104 18     Temp 05/17/17 1104 98.3 F (36.8 C)     Temp Source 05/17/17 1104 Oral     SpO2 05/17/17 1104 98 %     Weight 05/17/17 1109 260 lb (117.9 kg)     Height 05/17/17 1109 5\' 6"  (1.676 m)     Head Circumference --      Peak Flow --      Pain Score 05/17/17 1109 8     Pain Loc --      Pain Edu? --      Excl. in GC? --     Constitutional: Alert and oriented. Well appearing and in no acute distress. Eyes: Conjunctivae are normal.  Head: Atraumatic. Nose: No congestion/rhinnorhea. Mouth/Throat: Mucous membranes are moist.   Cardiovascular: Normal rate, regular rhythm. Respiratory: Normal respiratory effort.  No retractions GU: deferred Musculoskeletal: FROM all extremities, warm and well perfused.  Patient was able to ambulate to the exam room without difficulty.  Lumbar spine is  not tender.  Paravertebral muscles are spasmed.  There is no tenderness in the legs.  Neurovascular is intact Neurologic:  Normal speech and language.  Skin:  Skin is warm, dry and intact. No rash noted. Psychiatric: Mood and affect are normal. Speech and behavior are normal.  ____________________________________________   LABS (all labs ordered are listed, but only abnormal results are displayed)  Labs Reviewed - No data to display ____________________________________________   ____________________________________________  RADIOLOGY    ____________________________________________   PROCEDURES  Procedure(s) performed:  No  Procedures    ____________________________________________   INITIAL IMPRESSION / ASSESSMENT AND PLAN / ED COURSE  Pertinent labs & imaging results that were available during my care of the patient were reviewed by me and considered in my medical decision making (see chart for details).  Patient is a 52 year old female complaining of chronic back pain.  She has restless leg syndrome and states she had a bad night last night.  Her hydrocodone is not working for pain.  She is currently on a pain contract with her regular doctor.  On physical exam patient appears well.  She does move slowly but she is able to ambulate without difficulty.  There are no specific areas of tenderness.  Patient complains that pain is reproduced with palpation.  She appears to be neurovascularly intact.  She was given Toradol 30 mg IM.  Explained to her that since she is on a pain contract we cannot give her more narcotics.  Since she attributes the pain to being from her restless leg syndrome gave her a trial of Requip.  She is to follow-up with her regular doctor.  She is to discuss this medication with her regular doctor.  If she is worsening she can return the emergency department.  Patient states she understands will comply with instructions.  She was discharged in stable condition     As part of my medical decision making, I reviewed the following data within the electronic MEDICAL RECORD NUMBER Nursing notes reviewed and incorporated, Old chart reviewed, Notes from prior ED visits and [place] Controlled Substance Database  ____________________________________________   FINAL CLINICAL IMPRESSION(S) / ED DIAGNOSES  Final diagnoses:  Chronic midline low back pain with sciatica, sciatica laterality unspecified  Restless leg syndrome      NEW MEDICATIONS STARTED DURING THIS VISIT:  Discharge Medication List as of 05/17/2017 12:14 PM    START taking these medications   Details  rOPINIRole (REQUIP) 0.5 MG  tablet Take 1 tablet (0.5 mg total) by mouth at bedtime., Starting Tue 05/17/2017, Print         Note:  This document was prepared using Dragon voice recognition software and may include unintentional dictation errors.    Faythe GheeFisher, Susan W, PA-C 05/17/17 1413    Don PerkingVeronese, [place], MD 05/17/17 1440

## 2017-05-17 NOTE — Discharge Instructions (Signed)
Follow-up with your doctor if you are not better in 3-5 days.  Use medication as prescribed.  Requip is a medication used to help with restless leg syndrome.  Use this medication at bedtime.  Please discuss this medication with your regular doctor.  We do not treat chronic pain in the emergency department.  Follow-up with your regular doctor for your pain control.

## 2017-05-17 NOTE — ED Notes (Signed)
See triage note  States pain is moving into both legs  No recent injury

## 2017-05-17 NOTE — ED Triage Notes (Signed)
Pt in via POV with complaints of back pain radiating down into both legs.  Pt reports chronic back pain and restless leg syndrome which her pain medication is not working.  Vitals WDL, NAD noted at this time.

## 2017-06-07 DIAGNOSIS — F32A Depression, unspecified: Secondary | ICD-10-CM | POA: Insufficient documentation

## 2017-06-07 DIAGNOSIS — F329 Major depressive disorder, single episode, unspecified: Secondary | ICD-10-CM | POA: Insufficient documentation

## 2017-07-05 DIAGNOSIS — M25561 Pain in right knee: Secondary | ICD-10-CM | POA: Insufficient documentation

## 2017-08-19 ENCOUNTER — Emergency Department: Payer: Medicaid Other

## 2017-08-19 ENCOUNTER — Other Ambulatory Visit: Payer: Self-pay

## 2017-08-19 ENCOUNTER — Emergency Department
Admission: EM | Admit: 2017-08-19 | Discharge: 2017-08-20 | Disposition: A | Discharge status: Home / Self Care | Payer: Medicaid Other | Attending: Emergency Medicine | Admitting: Emergency Medicine

## 2017-08-19 ENCOUNTER — Encounter: Payer: Self-pay | Admitting: *Deleted

## 2017-08-19 DIAGNOSIS — Z79899 Other long term (current) drug therapy: Secondary | ICD-10-CM | POA: Insufficient documentation

## 2017-08-19 DIAGNOSIS — M79672 Pain in left foot: Secondary | ICD-10-CM | POA: Diagnosis not present

## 2017-08-19 DIAGNOSIS — M545 Low back pain, unspecified: Secondary | ICD-10-CM

## 2017-08-19 DIAGNOSIS — M159 Polyosteoarthritis, unspecified: Secondary | ICD-10-CM | POA: Insufficient documentation

## 2017-08-19 DIAGNOSIS — I10 Essential (primary) hypertension: Secondary | ICD-10-CM | POA: Diagnosis not present

## 2017-08-19 MED ORDER — KETOROLAC TROMETHAMINE 30 MG/ML IJ SOLN
30.0000 mg | Freq: Once | INTRAMUSCULAR | Status: AC
  Administered 2017-08-19: 30 mg via INTRAMUSCULAR
  Filled 2017-08-19: qty 1

## 2017-08-19 MED ORDER — LIDOCAINE 5 % EX PTCH: 1.0000 | MEDICATED_PATCH | Freq: Two times a day (BID) | CUTANEOUS | 0 refills | Status: AC

## 2017-08-19 MED ORDER — KETOROLAC TROMETHAMINE 10 MG PO TABS: 10.0000 mg | ORAL_TABLET | Freq: Four times a day (QID) | ORAL | 0 refills | Status: DC | PRN

## 2017-08-19 MED ORDER — CYCLOBENZAPRINE HCL 5 MG PO TABS: ORAL_TABLET | ORAL | 0 refills | Status: DC

## 2017-08-19 MED ORDER — LIDOCAINE 5 % EX PTCH
1.0000 | MEDICATED_PATCH | CUTANEOUS | Status: DC
  Administered 2017-08-19: 1 via TRANSDERMAL
  Filled 2017-08-19: qty 1

## 2017-08-19 MED ORDER — CYCLOBENZAPRINE HCL 10 MG PO TABS
5.0000 mg | ORAL_TABLET | Freq: Once | ORAL | Status: AC
  Administered 2017-08-19: 5 mg via ORAL
  Filled 2017-08-19: qty 1

## 2017-08-19 NOTE — ED Provider Notes (Signed)
[facility] Emergency Department Provider Note  ____________________________________________  Time seen: Approximately 10:58 PM  I have reviewed the triage vital signs and the nursing notes.   HISTORY  Chief Complaint Back Pain and Foot Pain    HPI Ariel Davis is a 52 y.o. female that presents emergency department for evaluation of back pain and foot pain for 2 to 3 days.  She states that she has arthritis in her back and a history of sciatica.  Pain is primarily on the lower left side of her back.  Pain is worse with moving. Left foot pain is primarily over the top of her foot. Patient needs a right knee replacement and  is unsure what leg to bear weight on and thinks this may be contributing to her left foot pain.  She states that the left knee is "not very good either." She is trying to get into a pain clinic.  She has had cortisone shots in back before.  She takes oxycodone and gabapentin for back but this does not help with her knee. No trauma.  No bowel or bladder dysfunction or saddle paresthesias.    She denies fever, chills, nausea, vomiting, abdominal pain, dysuria, urgency, frequency, numbness, tingling.   Past Medical History:  Diagnosis Date  . Chronic back pain   . H/O degenerative disc disease   . Hypertension   . Sciatica     There are no active problems to display for this patient.   History reviewed. No pertinent surgical history.  Prior to Admission medications   Medication Sig Start Date End Date Taking? Authorizing Provider  amLODipine (NORVASC) 5 MG tablet Take 5 mg by mouth daily. 07/12/16   [provider]  cyclobenzaprine (FLEXERIL) 5 MG tablet Take 1-2 tablets 3 times daily as needed 08/19/17   Enid Derry, PA-C  gabapentin (NEURONTIN) 300 MG capsule Take 800 mg 3 (three) times daily by mouth.    [provider]  ketorolac (TORADOL) 10 MG tablet Take 1 tablet (10 mg total) by mouth every 6 (six) hours as  needed. 08/19/17   Enid Derry, PA-C  lidocaine (LIDODERM) 5 % Place 1 patch onto the skin every 12 (twelve) hours. Remove & Discard patch within 12 hours or as directed by MD 08/19/17 08/19/18  Enid Derry, PA-C  lisinopril-hydrochlorothiazide (PRINZIDE,ZESTORETIC) 20-12.5 MG tablet Take 1 tablet by mouth daily. 07/12/16   [provider]  methylPREDNISolone (MEDROL DOSEPAK) 4 MG TBPK tablet Take 6 pills on day one then decrease by 1 pill each day 03/07/17   Faythe Ghee, PA-C  rOPINIRole (REQUIP) 0.5 MG tablet Take 1 tablet (0.5 mg total) by mouth at bedtime. 05/17/17   Faythe Ghee, PA-C    Allergies Oxycodone  History reviewed. No pertinent family history.  Social History Social History   Tobacco Use  . Smoking status: Never Smoker  . Smokeless tobacco: Never Used  Substance Use Topics  . Alcohol use: No  . Drug use: No     Review of Systems  Constitutional: No fever/chills Cardiovascular: No chest pain. Respiratory: No SOB. Gastrointestinal: No abdominal pain.  No nausea, no vomiting.  Musculoskeletal: Positive for back and foot pain.  Skin: Negative for rash, abrasions, lacerations, ecchymosis. Neurological: Negative for headaches, numbness or tingling   ____________________________________________   PHYSICAL EXAM:  VITAL SIGNS: ED Triage Vitals  Enc Vitals Group     BP 08/19/17 2043 (!) 154/103     Pulse Rate 08/19/17 2043 (!) 58  Resp 08/19/17 2043 16     Temp 08/19/17 2043 98.7 F (37.1 C)     Temp Source 08/19/17 2043 Oral     SpO2 08/19/17 2043 100 %     Weight 08/19/17 2042 260 lb (117.9 kg)     Height 08/19/17 2042  (1.702 m)     Head Circumference --      Peak Flow --      Pain Score 08/19/17 2042 9     Pain Loc --      Pain Edu? --      Excl. in GC? --      Constitutional: Alert and oriented. Well appearing and in no acute distress. Eyes: Conjunctivae are normal. PERRL. EOMI. Head: Atraumatic. ENT:      Ears:       Nose: No congestion/rhinnorhea.      Mouth/Throat: Mucous membranes are moist.  Neck: No stridor.   Cardiovascular: Normal rate, regular rhythm.  Good peripheral circulation. Respiratory: Normal respiratory effort without tachypnea or retractions. Lungs CTAB. Good air entry to the bases with no decreased or absent breath sounds. Musculoskeletal: Full range of motion to all extremities. No gross deformities appreciated. No lumbar spinal tenderness. Tenderness to palpation over left SI joint. Negative straight leg raise. Tenderness to palpation over dorsal foot. No erythema. No calf pain. Neurologic:  Normal speech and language. No gross focal neurologic deficits are appreciated.  Skin:  Skin is warm, dry and intact. No rash noted. Psychiatric: Mood and affect are normal. Speech and behavior are normal. Patient exhibits appropriate insight and judgement.   ____________________________________________   LABS (all labs ordered are listed, but only abnormal results are displayed)  Labs Reviewed - No data to display ____________________________________________  EKG   ____________________________________________  RADIOLOGY Lexine Baton, personally viewed and evaluated these images (plain radiographs) as part of my medical decision making, as well as reviewing the written report by the radiologist.  Dg Foot Complete Left  Result Date: 08/19/2017 CLINICAL DATA:  Left foot pain EXAM: LEFT FOOT - COMPLETE 3+ VIEW COMPARISON:  None. FINDINGS: There is no evidence of fracture or dislocation. Osteoarthritis of the interphalangeal joint of the great toe, DIP and PIP joints of the second through fifth toes and midfoot articulations. Calcaneal enthesopathy is noted along the plantar aspect. Accessory ossicle is seen adjacent to the cuboid. Mild soft tissue swelling is seen along the dorsum of the foot. IMPRESSION: Osteoarthritis of the toes and midfoot. Mild soft tissue swelling. No acute osseous  abnormality. Electronically Signed   By: Tollie Eth M.D.   On: 08/19/2017 23:16    ____________________________________________    PROCEDURES  Procedure(s) performed:    Procedures    Medications  lidocaine (LIDODERM) 5 % 1 patch (1 patch Transdermal Patch Applied 08/19/17 2250)  ketorolac (TORADOL) 30 MG/ML injection 30 mg (30 mg Intramuscular Given 08/19/17 2249)  cyclobenzaprine (FLEXERIL) tablet 5 mg (5 mg Oral Given 08/19/17 2244)     ____________________________________________   INITIAL IMPRESSION / ASSESSMENT AND PLAN / ED COURSE  Pertinent labs & imaging results that were available during my care of the patient were reviewed by me and considered in my medical decision making (see chart for details).  Review of the [place] CSRS was performed in accordance of the [facility] prior to dispensing any controlled drugs.   Patient presented to the emergency department for evaluation of low back pain and foot pain. Patient's diagnosis is consistent with osteoarthritis and SI inflammation. Pain improved with  Toradol, flexeril, lidoderm. Patient will be discharged home with prescriptions for toradol, flexeril, lidoderm. Patient is to follow up with ortho and PCP as directed. Patient is given ED precautions to return to the ED for any worsening or new symptoms.     ____________________________________________  FINAL CLINICAL IMPRESSION(S) / ED DIAGNOSES  Final diagnoses:  Acute left-sided low back pain without sciatica  Osteoarthritis of multiple joints, unspecified osteoarthritis type      NEW MEDICATIONS STARTED DURING THIS VISIT:  ED Discharge Orders        Ordered    ketorolac (TORADOL) 10 MG tablet  Every 6 hours PRN     08/19/17 2329    cyclobenzaprine (FLEXERIL) 5 MG tablet     08/19/17 2329    lidocaine (LIDODERM) 5 %  Every 12 hours     08/19/17 2329          This chart was dictated using voice recognition software/Dragon. Despite best efforts to proofread,  errors can occur which can change the meaning. Any change was purely unintentional.    Enid Derry, PA-C 08/19/17 2355    Jeanmarie Plant, MD 08/20/17 0030

## 2017-08-19 NOTE — ED Triage Notes (Signed)
Pt to Ed reporting pain in lower left side of back that is worse when taking a deep breath or moving. Hx of arthritis and degenerative discs. No changes in urine. Pt also reporting pain in left foot and reports the pain feels like someone is "drawing my foot up" No swelling or deformity noted and no injury reported.

## 2017-08-31 DIAGNOSIS — M79672 Pain in left foot: Secondary | ICD-10-CM | POA: Insufficient documentation

## 2017-08-31 DIAGNOSIS — M79671 Pain in right foot: Secondary | ICD-10-CM | POA: Insufficient documentation

## 2017-09-18 ENCOUNTER — Emergency Department: Payer: Medicaid Other

## 2017-09-18 ENCOUNTER — Emergency Department
Admission: EM | Admit: 2017-09-18 | Discharge: 2017-09-18 | Disposition: A | Discharge status: Home / Self Care | Payer: Medicaid Other | Attending: Emergency Medicine | Admitting: Emergency Medicine

## 2017-09-18 ENCOUNTER — Other Ambulatory Visit: Payer: Self-pay

## 2017-09-18 DIAGNOSIS — I10 Essential (primary) hypertension: Secondary | ICD-10-CM | POA: Insufficient documentation

## 2017-09-18 DIAGNOSIS — J020 Streptococcal pharyngitis: Secondary | ICD-10-CM

## 2017-09-18 DIAGNOSIS — Z79899 Other long term (current) drug therapy: Secondary | ICD-10-CM | POA: Diagnosis not present

## 2017-09-18 DIAGNOSIS — J029 Acute pharyngitis, unspecified: Secondary | ICD-10-CM | POA: Diagnosis present

## 2017-09-18 LAB — CBC WITH DIFFERENTIAL/PLATELET
BASOS ABS: 0.1 10*3/uL (ref 0–0.1)
BASOS PCT: 1 %
EOS ABS: 0 10*3/uL (ref 0–0.7)
EOS PCT: 0 %
HCT: 40.2 % (ref 35.0–47.0)
Hemoglobin: 12.8 g/dL (ref 12.0–16.0)
Lymphocytes Relative: 10 %
Lymphs Abs: 0.6 10*3/uL — ABNORMAL LOW (ref 1.0–3.6)
MCH: 26.8 pg (ref 26.0–34.0)
MCHC: 32 g/dL (ref 32.0–36.0)
MCV: 83.7 fL (ref 80.0–100.0)
MONO ABS: 0.6 10*3/uL (ref 0.2–0.9)
MONOS PCT: 10 %
Neutro Abs: 4.7 10*3/uL (ref 1.4–6.5)
Neutrophils Relative %: 79 %
PLATELETS: 228 10*3/uL (ref 150–440)
RBC: 4.8 MIL/uL (ref 3.80–5.20)
RDW: 14.2 % (ref 11.5–14.5)
WBC: 6.1 10*3/uL (ref 3.6–11.0)

## 2017-09-18 LAB — GROUP A STREP BY PCR: Group A Strep by PCR: DETECTED — AB

## 2017-09-18 LAB — BASIC METABOLIC PANEL
Anion gap: 7 (ref 5–15)
BUN: 16 mg/dL (ref 6–20)
CALCIUM: 9 mg/dL (ref 8.9–10.3)
CHLORIDE: 103 mmol/L (ref 101–111)
CO2: 26 mmol/L (ref 22–32)
CREATININE: 0.72 mg/dL (ref 0.44–1.00)
GFR calc non Af Amer: >60 mL/min (ref >60)
Glucose, Bld: 109 mg/dL — ABNORMAL HIGH (ref 65–99)
Potassium: 3.9 mmol/L (ref 3.5–5.1)
SODIUM: 136 mmol/L (ref 135–145)

## 2017-09-18 MED ORDER — IOHEXOL 300 MG/ML  SOLN
75.0000 mL | Freq: Once | INTRAMUSCULAR | Status: AC | PRN
  Administered 2017-09-18: 75 mL via INTRAVENOUS
  Filled 2017-09-18: qty 75

## 2017-09-18 MED ORDER — SODIUM CHLORIDE 0.9 % IV SOLN
3.0000 g | Freq: Once | INTRAVENOUS | Status: AC
  Administered 2017-09-18: 3 g via INTRAVENOUS
  Filled 2017-09-18: qty 3

## 2017-09-18 MED ORDER — AMOXICILLIN-POT CLAVULANATE 875-125 MG PO TABS: 1.0000 | ORAL_TABLET | Freq: Two times a day (BID) | ORAL | 0 refills | Status: DC

## 2017-09-18 MED ORDER — METHYLPREDNISOLONE SODIUM SUCC 125 MG IJ SOLR
80.0000 mg | Freq: Once | INTRAMUSCULAR | Status: AC
  Administered 2017-09-18: 80 mg via INTRAVENOUS
  Filled 2017-09-18: qty 2

## 2017-09-18 MED ORDER — SODIUM CHLORIDE 0.9 % IV BOLUS
1000.0000 mL | Freq: Once | INTRAVENOUS | Status: AC
  Administered 2017-09-18: 1000 mL via INTRAVENOUS

## 2017-09-18 NOTE — ED Triage Notes (Signed)
Pt arrives with family with c/o sore throat, body aches, chills. Unsure of fevers. Symptoms began yesterday. Alert and oriented.

## 2017-09-18 NOTE — ED Notes (Signed)
See triage note  Presents with sore throat and body aches which started yesterday  Unsure of fever at home but has had chills   Low grade fever on arrival  Having increased pain with swallowing

## 2017-09-18 NOTE — ED Provider Notes (Signed)
[facility] Emergency Department Provider Note  ____________________________________________  Time seen: Approximately 8:00 AM  I have reviewed the triage vital signs and the nursing notes.   HISTORY  Chief Complaint Sore Throat and Generalized Body Aches   HPI ZYANA AMARO is a 52 y.o. female who presents to the emergency department for treatment and evaluation of sore throat on the left side, body aches, and chills. Symptoms started yesterday and have progressively worsened. No relief with ibuprofen.   Past Medical History:  Diagnosis Date  . Chronic back pain   . H/O degenerative disc disease   . Hypertension   . Sciatica     There are no active problems to display for this patient.   History reviewed. No pertinent surgical history.  Prior to Admission medications   Medication Sig Start Date End Date Taking? Authorizing Provider  venlafaxine XR (EFFEXOR-XR) 37.5 MG 24 hr capsule Take 37.5 mg by mouth daily with breakfast.   Yes [provider]  amLODipine (NORVASC) 5 MG tablet Take 5 mg by mouth daily. 07/12/16   [provider]  amoxicillin-clavulanate (AUGMENTIN) 875-125 MG tablet Take 1 tablet by mouth 2 (two) times daily. 09/18/17   Kem Boroughs B, FNP  cyclobenzaprine (FLEXERIL) 5 MG tablet Take 1-2 tablets 3 times daily as needed 08/19/17   Enid Derry, PA-C  gabapentin (NEURONTIN) 300 MG capsule Take 800 mg 3 (three) times daily by mouth.    [provider]  ketorolac (TORADOL) 10 MG tablet Take 1 tablet (10 mg total) by mouth every 6 (six) hours as needed. 08/19/17   Enid Derry, PA-C  lidocaine (LIDODERM) 5 % Place 1 patch onto the skin every 12 (twelve) hours. Remove & Discard patch within 12 hours or as directed by MD 08/19/17 08/19/18  Enid Derry, PA-C  lisinopril-hydrochlorothiazide (PRINZIDE,ZESTORETIC) 20-12.5 MG tablet Take 1 tablet by mouth daily. 07/12/16   [provider]   methylPREDNISolone (MEDROL DOSEPAK) 4 MG TBPK tablet Take 6 pills on day one then decrease by 1 pill each day 03/07/17   Faythe Ghee, PA-C  rOPINIRole (REQUIP) 0.5 MG tablet Take 1 tablet (0.5 mg total) by mouth at bedtime. 05/17/17   Faythe Ghee, PA-C    Allergies Oxycodone and Duloxetine  History reviewed. No pertinent family history.  Social History Social History   Tobacco Use  . Smoking status: Never Smoker  . Smokeless tobacco: Never Used  Substance Use Topics  . Alcohol use: No  . Drug use: No    Review of Systems Constitutional: Positive for fever/chills ENT: Positive for sore throat. Cardiovascular: Denies chest pain. Respiratory: Negative for shortness of breath. negative for cough. Gastrointestinal: no nausea,  no vomiting.  no diarrhea.  Musculoskeletal: Positive for body aches Skin: negative for rash. Neurological: negative for headaches ____________________________________________   PHYSICAL EXAM:  VITAL SIGNS: ED Triage Vitals  Enc Vitals Group     BP 09/18/17 0734 135/76     Pulse Rate 09/18/17 0734 90     Resp 09/18/17 0734 18     Temp 09/18/17 0734 99.1 F (37.3 C)     Temp Source 09/18/17 0734 Oral     SpO2 09/18/17 0734 96 %     Weight 09/18/17 0732 260 lb (117.9 kg)     Height 09/18/17 0732 5\' 7"  (1.702 m)     Head Circumference --      Peak Flow --      Pain Score 09/18/17 0732 9  Pain Loc --      Pain Edu? --      Excl. in GC? --     Constitutional: Alert and oriented. Acutely ill appearing and in no acute distress. Eyes: Conjunctivae are normal. EOMI. Ears: Bilateral TM normal Nose: no sinus congestion noted; no rhinnorhea. Mouth/Throat: Mucous membranes are moist.  Oropharynx markedly erythematous. Tonsils: right 1+ with scant exudate, left 2+ with pustular lesion. Uvula remains midline. Muffled voice. Neck: No stridor.  Lymphatic: Left anterior cervical lymphadenopathy. Cardiovascular: Normal rate, regular rhythm. Good  peripheral circulation. Respiratory: Normal respiratory effort.  No retractions. Breath sounds clear to auscultation. Gastrointestinal: Soft and nontender.  Musculoskeletal: FROM x 4 extremities.  Neurologic:  Normal speech and language.  Skin:  Skin is warm, dry and intact. No rash noted. Psychiatric: Mood and affect are normal. Speech and behavior are normal.  ____________________________________________   LABS (all labs ordered are listed, but only abnormal results are displayed)  Labs Reviewed  GROUP A STREP BY PCR - Abnormal; Notable for the following components:      Result Value   Group A Strep by PCR DETECTED (*)    All other components within normal limits  BASIC METABOLIC PANEL - Abnormal; Notable for the following components:   Glucose, Bld 109 (*)    All other components within normal limits  CBC WITH DIFFERENTIAL/PLATELET - Abnormal; Notable for the following components:   Lymphs Abs 0.6 (*)    All other components within normal limits   ____________________________________________  EKG  Not indicated. ____________________________________________  RADIOLOGY  CT soft tissue neck: Mild tonsillar enlargement bilaterally. No mass or abscess identified. ____________________________________________   PROCEDURES  Procedure(s) performed: None  Critical Care performed: No ____________________________________________   INITIAL IMPRESSION / ASSESSMENT AND PLAN / ED COURSE  52 y.o. female who presents to the emergency department for treatment and evaluation of sore throat, body aches, and chills. Differential diagnosis includes but is not limited to: acute pharyngitis, tonsillitis secondary to strep, para tonsillar abscess, cryptic tonsil, and cancerous process. CT soft tissue neck, labs, and antibiotics have been ordered as well as solu medrol. Family at bedside and will alert staff for any changes of concern.    ----------------------------------------- 10:30 AM  on 09/18/2017 -----------------------------------------  CT and lab results discussed with the patient. She will be discharged home with Augmentin and advised to continue taking her Vicoprofin as prescribed. She is to follow up with her PCP this week. She was advised to return to the ER for symptoms that change or worsen if unable to schedule an appointment.   Medications  sodium chloride 0.9 % bolus 1,000 mL (0 mLs Intravenous Stopped 09/18/17 1022)  Ampicillin-Sulbactam (UNASYN) 3 g in sodium chloride 0.9 % 100 mL IVPB (0 g Intravenous Stopped 09/18/17 1022)  methylPREDNISolone sodium succinate (SOLU-MEDROL) 125 mg/2 mL injection 80 mg (80 mg Intravenous Given 09/18/17 0828)  iohexol (OMNIPAQUE) 300 MG/ML solution 75 mL (75 mLs Intravenous Contrast Given 09/18/17 0856)    ED Discharge Orders        Ordered    amoxicillin-clavulanate (AUGMENTIN) 875-125 MG tablet  2 times daily     09/18/17 1013       Pertinent labs & imaging results that were available during my care of the patient were reviewed by me and considered in my medical decision making (see chart for details).    If controlled substance prescribed during this visit, 12 month history viewed on the [place] prior to issuing an initial prescription for  Schedule II or III opiod. ____________________________________________   FINAL CLINICAL IMPRESSION(S) / ED DIAGNOSES  Final diagnoses:  Strep pharyngitis    Note:  This document was prepared using Dragon voice recognition software and may include unintentional dictation errors.     Chinita Pester, FNP 09/18/17 1402    Rockne Menghini, MD 09/18/17 1513

## 2017-09-18 NOTE — Discharge Instructions (Signed)
Warm salt water gargle 4 times per day. Take your Vicoprofen as prescribed. Follow up with your PCP if not improving over the next 2 days.

## 2017-10-31 ENCOUNTER — Ambulatory Visit: Payer: Medicaid Other | Attending: Nurse Practitioner

## 2017-10-31 ENCOUNTER — Other Ambulatory Visit: Payer: Self-pay

## 2017-10-31 DIAGNOSIS — M6281 Muscle weakness (generalized): Secondary | ICD-10-CM | POA: Insufficient documentation

## 2017-10-31 DIAGNOSIS — G8929 Other chronic pain: Secondary | ICD-10-CM | POA: Insufficient documentation

## 2017-10-31 DIAGNOSIS — M545 Low back pain: Secondary | ICD-10-CM | POA: Diagnosis not present

## 2017-10-31 DIAGNOSIS — M5416 Radiculopathy, lumbar region: Secondary | ICD-10-CM | POA: Diagnosis present

## 2017-10-31 DIAGNOSIS — R262 Difficulty in walking, not elsewhere classified: Secondary | ICD-10-CM

## 2017-10-31 NOTE — Patient Instructions (Signed)
[employer] Access Code: [email or web address]   Seated Transversus Abdominis Bracing  10x3 with 5 second holds every 1-2 hours daily.

## 2017-10-31 NOTE — Therapy (Signed)
[facility] PH[facility]ICINE [address] [place], [place], [address] Phone: [phone]   Fax:  [phone]  Physical Therapy Evaluation  Patient Details  Name: Ariel Davis MRN: [id number] Date of Birth: Nov 07, 1965 Referring Provider: Celine MansMelissa Judson, ANP   Encounter Date: 10/31/2017  PT End of Session - 10/31/17 0808    Visit Number  1    Number of Visits  4    Date for PT Re-Evaluation  12/01/17    Authorization Type  1    Authorization Time Period  of 4 medicaid    PT Start Time  0808    PT Stop Time  0859    PT Time Calculation (min)  51 min    Activity Tolerance  Patient tolerated treatment well    Behavior During Therapy  [facility] for tasks assessed/performed       Past Medical History:  Diagnosis Date  . Chronic back pain   . H/O degenerative disc disease   . Hypertension   . Sciatica     No past surgical history on file.  There were no vitals filed for this visit.   Subjective Assessment - 10/31/17 0813    Subjective  Back pain: 9/10 currently (pt sitting) and at best. 10/10 at worst for the past 3 months. Low back pain L > R.  LE pain goes down usually to her knees, at times to her feet (along the L5 dermatome).   B LE pain 10/10 currently.      Pertinent History  Chronic low back pain with bilateral sciatica. Pain began around 1984. 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 Was diagnosed with detriorating discs. Feels like pain has gotten worse.  Denies loss of bowel or bladder control. Denies LE paresthesia but states that she has sciatica.   No back surgeries.  Has not had PT for her back before.   No falls within the last 6 months.  Pt also adds she has a walker since May 2018 due to a fall in which she fractured her L femur and pelvis area.  Pt fell going down the steps.   Was also told to keep her walker with her due to her R knee. Going to have a R knee replacement in the future.  Pt states  being in the pain clinic now and going to get an epidural injection to see how it works.   Bilateral LE pain began about 1 year ago.     Patient Stated Goals  Decrease pain.    Currently in Pain?  Yes    Pain Score  10-Worst pain ever Bilateral LE pain    Pain Location  Back    Pain Orientation  Left;Right;Posterior;Lower    Pain Type  Chronic pain    Pain Onset  More than a month ago    Pain Frequency  Constant    Aggravating Factors   Back pain: Walking (15 minutes), standing (10 min), moving around     Pain Relieving Factors  Nothing         [facility] PT Assessment - 10/31/17 0809      Assessment   Medical Diagnosis  Chronic low back pain with bilateral sciatica    Referring Provider  Celine MansMelissa Judson, ANP    Onset Date/Surgical Date  10/07/17    Hand Dominance  Right    Prior Therapy  yes for knees  Precautions   Precaution Comments  Anterolisthesis of L3 on L4 and L4 on L5 noted of a degenerative nature per imaging on  02/18/2017      Restrictions   Other Position/Activity Restrictions  no known weight bearing restrictions      Balance Screen   Has the patient fallen in the past 6 months  No    Has the patient had a decrease in activity level because of a fear of falling?   Yes    Is the patient reluctant to leave their home because of a fear of falling?   No      Home Environment   Additional Comments  Pt lives in a one story home alone.  3 steps to enter (back door), no rails.  Has a walker which she keeps in her car.        Prior Function   Vocation Requirements  PLOF: better able to ambulate, tolerate standing with less back and LE symptoms.       Observation/Other Assessments   Focus on Therapeutic Outcomes (FOTO)   Back FOTO 20      Posture/Postural Control   Posture Comments  Forward flexed, decreased bilateral hip extension, R anterior pelvic nutation, slight R side bend       AROM   Lumbar Flexion  WFL. Pain during movement both going into flexion, and  returning to neutral. Aberrant movement.  performed in sitting    Lumbar Extension  limited due to pain regular standing    Lumbar - Right Side Bend  limited with L back pulling performed in sitting    Lumbar - Left Side Bend  more limited compared to R side bending but less pain.  performed in sitting    Lumbar - Right Rotation  limited with L lateral shift; pain performed in sitting    Lumbar - Left Rotation  limited with R lateral shift. pain but less compared to R rotation.  performed in sitting      Strength   Right Hip Flexion  4-/5    Right Hip Extension  4-/5 seated hip extension isometric    Right Hip ABduction  4-/5 seated clamshell isometric    Right Hip ADduction  --    Left Hip Flexion  4-/5    Left Hip Extension  4-/5 seated hip extension isometric    Left Hip ABduction  4-/5 seated clamshell isometric    Right Knee Flexion  4-/5    Right Knee Extension  4/5    Left Knee Flexion  4/5    Left Knee Extension  4/5      Ambulation/Gait   Gait Comments  antalgic, decreased stance L LE with L lateral lean, R pelvic drop and L LE genu valgus during L LE stance phase of gait.                 Objective measurements completed on examination: See above findings.   Anterolisthesis of L3 on L4 and L4 on L5 is noted of a degenerative nature per 02/18/2017 imaging   Sitting feels better compared to standing  Blood pressure L arm sitting, mechanically taken, normal cuff:  143/87, HR 73  Medbridge Access Code: [email or web address]    Therapeutic exercise  Seated transversus abdominis contraction 10x5 seconds for 2 sets.   Reviewed HEP. Pt demonstrated and verbalized understanding.   Improved exercise technique, movement at target joints, use of target muscles after mod verbal, visual, tactile cues.     Improved  exercise technique, movement at target joints, use of target muscles after mod verbal, visual, tactile cues.   Patient is a 52 year old female who came to physical  therapy secondary to chronic low back pain with bilateral LE radiating symptoms. She also presetns with altered gait pattern and posture, reproduction of back pain with lumbar movements, aberrant movement pattern, bilateral LE weakenss, and difficulty performing functional tasks such as walking. Patient will benefit from skilled physical therapy services to address the aforementioned deficits.          PT Education - 10/31/17 0850    Education provided  Yes    Education Details  ther-ex, HEP, plan of care    Person(s) Educated  Patient    Methods  Explanation;Demonstration;Tactile cues;Verbal cues;Handout    Comprehension  Returned demonstration;Verbalized understanding       PT Short Term Goals - 10/31/17 1203      PT SHORT TERM GOAL #1   Title  Patient will be independent with her HEP to promote LE strength, improve ability to ambulate, and perform standing tasks with less back pain.     Baseline  Patient has started a limited home exercise program.     Time  2    Period  Weeks    Status  New    Target Date  11/17/17        PT Long Term Goals - 10/31/17 1150      PT LONG TERM GOAL #1   Title  Patient will have a decrease in back pain to 5/10 or less at worst to promote ability to ambulate and perform standing tasks.     Baseline  10/10 back pain at worst for the past 3 months (10/31/2017)    Time  4    Period  Weeks    Status  New    Target Date  12/01/17      PT LONG TERM GOAL #2   Title  Patient will have a decrease in bilateral LE symptoms to 5/10 at worst to promote ability to ambulate, perform standing tasks.     Baseline  10/10 bilateral LE symptoms/pain at worst (10/31/2017)    Time  4    Period  Weeks    Status  New    Target Date  12/01/17      PT LONG TERM GOAL #3   Title  Patient will improve bilateral hip extension and abduction strength by at least 1/2 MMT grade to promote ability to ambulate and peform standing tasks with less back and LE pain.      Baseline  hip abduction 4-/5 bilaterally, hip extension 4-/5 bilaterally (10/31/2017)    Time  4    Period  Weeks    Status  New    Target Date  12/01/17      PT LONG TERM GOAL #4   Title  Patient will improve her FOTO score by at least 10 points as a demonstration of improved function.     Baseline  Back FOTO score 20 (10/31/2017)    Time  4    Period  Weeks    Status  New    Target Date  12/01/17             Plan - 10/31/17 1137    Clinical Impression Statement  Patient is a 52 year old female who came to physical therapy secondary to chronic low back pain with bilateral LE radiating symptoms. She also presents with altered gait  pattern and posture, reproduction of back pain with lumbar movements, aberrant movement pattern, bilateral LE weakeness, and difficulty performing functional tasks such as walking and tolerating positions such as standing. Patient will benefit from skilled physical therapy services to address the aforementioned deficits.     History and Personal Factors relevant to plan of care:  Chronicity of condition, low back pain, R knee pain,  LE weakness, B LE symptoms, difficulty walking, performing standing tasks, pain with moving around.      Clinical Presentation  Evolving    Clinical Presentation due to:  Pt states that her pain has gotten worse    Clinical Decision Making  Moderate    Rehab Potential  Fair    Clinical Impairments Affecting Rehab Potential  (-) Chronicity of condition, worsening pain, R knee pain, bilateral LE symptoms; (+) motivated    PT Frequency  1x / week    PT Duration  4 weeks secondary to [employer]    PT Treatment/Interventions  Statistician;Iontophoresis 4mg /ml Dexamethasone;Traction;Aquatic Therapy;Ultrasound;Gait training;Therapeutic activities;Therapeutic exercise;Balance training;Neuromuscular re-education;Patient/family education;Manual techniques;Dry needling traction if appropriate    PT Next Visit Plan  core  strengthening, glute strengthening, lumbopelvic control, thoracic extension, scapular strengthening    Consulted and Agree with Plan of Care  Patient       Patient will benefit from skilled therapeutic intervention in order to improve the following deficits and impairments:  Pain, Postural dysfunction, Improper body mechanics, Difficulty walking, Decreased strength, Decreased range of motion, Abnormal gait  Visit Diagnosis: Chronic bilateral low back pain, with sciatica presence unspecified - Plan: PT plan of care cert/re-cert  Radiculopathy, lumbar region - Plan: PT plan of care cert/re-cert  Muscle weakness (generalized) - Plan: PT plan of care cert/re-cert  Difficulty in walking, not elsewhere classified - Plan: PT plan of care cert/re-cert     Problem List There are no active problems to display for this patient.   Loralyn Freshwater PT, DPT   10/31/2017, 2:28 PM  [place] [facility] PHYSICAL AND SPORTS MEDICINE [address], [place], [address] Phone: [phone]   Fax:  [phone]  Name: Ariel Davis MRN: [id number] Date of Birth: 03-15-1966

## 2017-11-08 ENCOUNTER — Ambulatory Visit: Payer: Medicaid Other | Attending: Nurse Practitioner

## 2017-11-08 DIAGNOSIS — R262 Difficulty in walking, not elsewhere classified: Secondary | ICD-10-CM | POA: Diagnosis present

## 2017-11-08 DIAGNOSIS — M5416 Radiculopathy, lumbar region: Secondary | ICD-10-CM | POA: Insufficient documentation

## 2017-11-08 DIAGNOSIS — M6281 Muscle weakness (generalized): Secondary | ICD-10-CM | POA: Diagnosis present

## 2017-11-08 DIAGNOSIS — M545 Low back pain: Secondary | ICD-10-CM | POA: Diagnosis present

## 2017-11-08 DIAGNOSIS — G8929 Other chronic pain: Secondary | ICD-10-CM | POA: Diagnosis present

## 2017-11-08 NOTE — Patient Instructions (Addendum)
Seated bilateral shoulder extension isometrics  Sitting on a chair   Activate your belly button muscles    Gently press your hands on your thighs to feel your trunk muscles activate.    Hold for 5 seconds.    Repeat 10 times.   Perform 3 sets daily.   Medbridge Access Code: [email or web address]   Seated Hip Abduction  Isometric 10x3 with 5 second holds

## 2017-11-08 NOTE — Therapy (Signed)
[place] [facility] [address] [place], [address] Phone: [phone]   Fax:  [phone]  Physical Therapy Treatment  Patient Details  Name: EFFA YARROW MRN: [id number] Date of Birth: [birth year] Referring Provider: Celine Mans, ANP   Encounter Date: 11/08/2017  PT End of Session - 11/08/17 1027    Visit Number  2    Number of Visits  4    Date for PT Re-Evaluation  12/01/17    Authorization Type  2    Authorization Time Period  of 4 medicaid    PT Start Time  1027    PT Stop Time  1116    PT Time Calculation (min)  49 min    Activity Tolerance  Patient tolerated treatment well    Behavior During Therapy  [facility] for tasks assessed/performed       Past Medical History:  Diagnosis Date  . Chronic back pain   . H/O degenerative disc disease   . Hypertension   . Sciatica     No past surgical history on file.  There were no vitals filed for this visit.  Subjective Assessment - 11/08/17 1028    Subjective  Went to the doctor on Friday. Took an x-ray of her R UE. Got scheduled for an MRI on Monday. Next Tuesday, she is also getting another imaging (does not know which one) because her doctor thinks she has a pinched nerve at her neck.     Back feels good. Had an epidural shot for her back last week which helped.   Had cramping bilateral anterior medial thighs and L lateral ankle area last night 2 times in a row.     Pertinent History  Chronic low back pain with bilateral sciatica. Pain began around 1984. 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 Was diagnosed with detriorating discs. Feels like pain has gotten worse.  Denies loss of bowel or bladder control. Denies LE paresthesia but states that she has sciatica.   No back surgeries.  Has not had PT for her back before.   No falls within the last 6 months.  Pt also adds she has a walker since May 2018 due to a fall in which she fractured her  L femur and pelvis area.  Pt fell going down the steps.   Was also told to keep her walker with her due to her R knee. Going to have a R knee replacement in the future.  Pt states being in the pain clinic now and going to get an epidural injection to see how it works.   Bilateral LE pain began about 1 year ago.     Patient Stated Goals  Decrease pain.    Currently in Pain?  No/denies    Pain Score  0-No pain 0/10 back pain but has neck and knee pain    Pain Onset  More than a month ago                               PT Education - 11/08/17 1042    Education provided  Yes    Education Details  ther-ex, HEP    Person(s) Educated  Patient    Methods  Explanation;Demonstration;Tactile cues;Verbal cues;Handout    Comprehension  Returned demonstration;Verbalized understanding       Objective    Anterolisthesis of L3 on  L4 and L4 on L5 is noted of a degenerative nature per 02/18/2017 imaging  Sitting feels better compared to standing    Medbridge Access Code: [facility]    Therapeutic exercise  Pt was recommended if she has loss of bowel or bladder control or saddle anesthesia, to contact her MD or go to the ER. Pt verbalized understanding.   Seated transversus abdominis contraction 10x5 seconds   Seated bilateral shoulder extension isometrics, hands on thighs 10x5 seconds for 3 sets  Seated R hip extension isometrics 10x5 seconds for 3 sets  Seated bilateral scapular retraction 10x5 seconds to promote thoracic extension for 2 sets. Cues to decrease shoulder shrug  Seated clamshells, hips less than 90 degrees flexion   Green band 1x. L medial thigh discomfort which eases with rest    Then manually resisted with PT 10x5 seconds. No pain   Then isometrics with thighs in neutral, resisting gait belt around thighs 10x5 seconds for 2 sets  Seated hip adduction ball squeeze with glute max and transversus abdominis muscle activation 10x with 5 seocnd holds,  then 3x5 seconds holds. Bilateral hip adductor discomfort.     Reviewed HEP. Pt demonstrated and verbalized understanding.     Improved exercise technique, movement at target joints, use of target muscles after mod verbal, visual, tactile cues.   Worked on trunk muscle activation to promote better low back positioning, as well as thoracic extension and glute muscle strengthening to help decrease low back pressure when standing or walking.  Bilateral thigh discomfort following hip adductor exercises. No increase in back pain throughout session.          PT Short Term Goals - 10/31/17 1203      PT SHORT TERM GOAL #1   Title  Patient will be independent with her HEP to promote LE strength, improve ability to ambulate, and perform standing tasks with less back pain.     Baseline  Patient has started a limited home exercise program.     Time  2    Period  Weeks    Status  New    Target Date  11/17/17        PT Long Term Goals - 10/31/17 1150      PT LONG TERM GOAL #1   Title  Patient will have a decrease in back pain to 5/10 or less at worst to promote ability to ambulate and perform standing tasks.     Baseline  10/10 back pain at worst for the past 3 months (10/31/2017)    Time  4    Period  Weeks    Status  New    Target Date  12/01/17      PT LONG TERM GOAL #2   Title  Patient will have a decrease in bilateral LE symptoms to 5/10 at worst to promote ability to ambulate, perform standing tasks.     Baseline  10/10 bilateral LE symptoms/pain at worst (10/31/2017)    Time  4    Period  Weeks    Status  New    Target Date  12/01/17      PT LONG TERM GOAL #3   Title  Patient will improve bilateral hip extension and abduction strength by at least 1/2 MMT grade to promote ability to ambulate and peform standing tasks with less back and LE pain.     Baseline  hip abduction 4-/5 bilaterally, hip extension 4-/5 bilaterally (10/31/2017)    Time  4  Period  Weeks    Status   New    Target Date  12/01/17      PT LONG TERM GOAL #4   Title  Patient will improve her FOTO score by at least 10 points as a demonstration of improved function.     Baseline  Back FOTO score 20 (10/31/2017)    Time  4    Period  Weeks    Status  New    Target Date  12/01/17            Plan - 11/08/17 1025    Clinical Impression Statement  Worked on trunk muscle activation to promote better low back positioning, as well as thoracic extension and glute muscle strengthening to help decrease low back pressure when standing or walking.  Bilateral thigh discomfort following hip adductor exercises. No increase in back pain throughout session.     Rehab Potential  Fair    Clinical Impairments Affecting Rehab Potential  (-) Chronicity of condition, worsening pain, R knee pain, bilateral LE symptoms; (+) motivated    PT Frequency  1x / week    PT Duration  4 weeks secondary to [employer] insurance    PT Treatment/Interventions  Statisticianlectrical Stimulation;Iontophoresis 4mg /ml Dexamethasone;Traction;Aquatic Therapy;Ultrasound;Gait training;Therapeutic activities;Therapeutic exercise;Balance training;Neuromuscular re-education;Patient/family education;Manual techniques;Dry needling traction if appropriate    PT Next Visit Plan  core strengthening, glute strengthening, lumbopelvic control, thoracic extension, scapular strengthening    Consulted and Agree with Plan of Care  Patient       Patient will benefit from skilled therapeutic intervention in order to improve the following deficits and impairments:  Pain, Postural dysfunction, Improper body mechanics, Difficulty walking, Decreased strength, Decreased range of motion, Abnormal gait  Visit Diagnosis: Chronic bilateral low back pain, with sciatica presence unspecified  Radiculopathy, lumbar region  Muscle weakness (generalized)  Difficulty in walking, not elsewhere classified     Problem List There are no active problems to display for  this patient.   Loralyn FreshwaterMiguel Elodie Panameno PT, DPT   11/08/2017, 2:35 PM  [place] [facility] [address] [place], [place], [address] Phone: [phone]   Fax:  [phone]  Name: Carlyle Lipaeresa A Bellantoni MRN: [id number] Date of Birth: 09-06-1965

## 2017-11-15 ENCOUNTER — Ambulatory Visit: Payer: Medicaid Other

## 2017-11-17 ENCOUNTER — Emergency Department: Payer: Medicaid Other

## 2017-11-17 ENCOUNTER — Other Ambulatory Visit: Payer: Self-pay

## 2017-11-17 ENCOUNTER — Encounter: Payer: Self-pay | Admitting: Intensive Care

## 2017-11-17 ENCOUNTER — Emergency Department
Admission: EM | Admit: 2017-11-17 | Discharge: 2017-11-17 | Disposition: A | Discharge status: Home / Self Care | Payer: Medicaid Other | Attending: Emergency Medicine | Admitting: Emergency Medicine

## 2017-11-17 DIAGNOSIS — I10 Essential (primary) hypertension: Secondary | ICD-10-CM | POA: Diagnosis not present

## 2017-11-17 DIAGNOSIS — R55 Syncope and collapse: Secondary | ICD-10-CM | POA: Diagnosis not present

## 2017-11-17 DIAGNOSIS — R002 Palpitations: Secondary | ICD-10-CM | POA: Diagnosis not present

## 2017-11-17 DIAGNOSIS — Z79899 Other long term (current) drug therapy: Secondary | ICD-10-CM | POA: Diagnosis not present

## 2017-11-17 HISTORY — DX: Anxiety disorder, unspecified: F41.9

## 2017-11-17 LAB — BASIC METABOLIC PANEL
Anion gap: 6 (ref 5–15)
BUN: 15 mg/dL (ref 6–20)
CALCIUM: 9.6 mg/dL (ref 8.9–10.3)
CO2: 30 mmol/L (ref 22–32)
Chloride: 105 mmol/L (ref 98–111)
Creatinine, Ser: 0.79 mg/dL (ref 0.44–1.00)
GFR calc non Af Amer: >60 mL/min (ref >60)
Glucose, Bld: 92 mg/dL (ref 70–99)
Potassium: 4.1 mmol/L (ref 3.5–5.1)
SODIUM: 141 mmol/L (ref 135–145)

## 2017-11-17 LAB — URINALYSIS, COMPLETE (UACMP) WITH MICROSCOPIC
BACTERIA UA: NONE SEEN
Bilirubin Urine: NEGATIVE
Glucose, UA: NEGATIVE mg/dL
Hgb urine dipstick: NEGATIVE
Ketones, ur: NEGATIVE mg/dL
Leukocytes, UA: NEGATIVE
NITRITE: NEGATIVE
Protein, ur: NEGATIVE mg/dL
SPECIFIC GRAVITY, URINE: 1.015 (ref 1.005–1.030)
pH: 5 (ref 5.0–8.0)

## 2017-11-17 LAB — CBC
HCT: 41.2 % (ref 35.0–47.0)
Hemoglobin: 13.2 g/dL (ref 12.0–16.0)
MCH: 26.8 pg (ref 26.0–34.0)
MCHC: 32.1 g/dL (ref 32.0–36.0)
MCV: 83.5 fL (ref 80.0–100.0)
PLATELETS: 281 10*3/uL (ref 150–440)
RBC: 4.94 MIL/uL (ref 3.80–5.20)
RDW: 15 % — ABNORMAL HIGH (ref 11.5–14.5)
WBC: 7.1 10*3/uL (ref 3.6–11.0)

## 2017-11-17 LAB — TSH: TSH: 1.352 u[IU]/mL (ref 0.350–4.500)

## 2017-11-17 LAB — POCT PREGNANCY, URINE: PREG TEST UR: NEGATIVE

## 2017-11-17 LAB — TROPONIN I

## 2017-11-17 LAB — FIBRIN DERIVATIVES D-DIMER (ARMC ONLY): FIBRIN DERIVATIVES D-DIMER (ARMC): 628.27 ng{FEU}/mL — AB (ref 0.00–499.00)

## 2017-11-17 MED ORDER — IOPAMIDOL (ISOVUE-370) INJECTION 76%
75.0000 mL | Freq: Once | INTRAVENOUS | Status: AC | PRN
  Administered 2017-11-17: 75 mL via INTRAVENOUS
  Filled 2017-11-17: qty 75

## 2017-11-17 NOTE — ED Triage Notes (Signed)
Patient states she had a "epidural shot" on July 31st and starting a week later has been experiencing palpitations and feels as if she is going pass out. When she has the near syncope episodes she states "I have no energy and I start sweating everywhere" A&O x4 upon arrival to ER. Denies LOC or hitting head

## 2017-11-17 NOTE — ED Provider Notes (Signed)
[facility] Emergency Department Provider Note  ____________________________________________  Time seen: Approximately 3:55 PM  I have reviewed the triage vital signs and the nursing notes.   HISTORY  Chief Complaint Palpitations   HPI Ariel Davis is a 52 y.o. female with a history of chronic back pain and hypertension who presents for evaluation of palpitations and near syncopal episodes.  Patient reports that 2 weeks ago she had a steroid shot for her chronic back pain.  Since then she has been having dizzy spells and palpitations.  She reports that the palpitations happen several times daily lasting 5 to 10 minutes at a time and resolve on their own.  When they come on she reports that she feels short of breath.  She has had several episodes over the last week of feeling like she is going to pass out.  These episodes usually happen with the palpitations but she has episodes of palpitations without feeling dizzy. She reports mild SOB which has been present with exertion over the last week.  She does have a dry cough.  No fever.  No chest pain.  No personal history of blood clots.  Patient's daughter has pulmonary embolism in the past.  No recent travel immobilization, no hemoptysis, no exogenous hormones.  Patient has never been on steroids before.  No back pain or abdominal pain  Past Medical History:  Diagnosis Date  . Anxiety   . Chronic back pain   . H/O degenerative disc disease   . Hypertension   . Sciatica      Prior to Admission medications   Medication Sig Start Date End Date Taking? Authorizing Provider  amLODipine (NORVASC) 5 MG tablet Take 5 mg by mouth daily. 07/12/16   [provider]  amoxicillin-clavulanate (AUGMENTIN) 875-125 MG tablet Take 1 tablet by mouth 2 (two) times daily. 09/18/17   Kem Boroughsriplett, Cari B, FNP  cyclobenzaprine (FLEXERIL) 5 MG tablet Take 1-2 tablets 3 times daily as needed 08/19/17   Enid DerryWagner, Ashley, PA-C    gabapentin (NEURONTIN) 300 MG capsule Take 800 mg 3 (three) times daily by mouth.    [provider]  ketorolac (TORADOL) 10 MG tablet Take 1 tablet (10 mg total) by mouth every 6 (six) hours as needed. 08/19/17   Enid DerryWagner, Ashley, PA-C  lidocaine (LIDODERM) 5 % Place 1 patch onto the skin every 12 (twelve) hours. Remove & Discard patch within 12 hours or as directed by MD 08/19/17 08/19/18  Enid DerryWagner, Ashley, PA-C  lisinopril-hydrochlorothiazide (PRINZIDE,ZESTORETIC) 20-12.5 MG tablet Take 1 tablet by mouth daily. 07/12/16   [provider]  methylPREDNISolone (MEDROL DOSEPAK) 4 MG TBPK tablet Take 6 pills on day one then decrease by 1 pill each day 03/07/17   Faythe GheeFisher, Susan W, PA-C  rOPINIRole (REQUIP) 0.5 MG tablet Take 1 tablet (0.5 mg total) by mouth at bedtime. 05/17/17   Sherrie MustacheFisher, Roselyn BeringSusan W, PA-C  venlafaxine XR (EFFEXOR-XR) 37.5 MG 24 hr capsule Take 37.5 mg by mouth daily with breakfast.    [provider]    Allergies Oxycodone and Duloxetine  History reviewed. No pertinent family history.  Social History Social History   Tobacco Use  . Smoking status: Never Smoker  . Smokeless tobacco: Never Used  Substance Use Topics  . Alcohol use: No  . Drug use: No    Review of Systems  Constitutional: Negative for fever. + dizziness Eyes: Negative for visual changes. ENT: Negative for sore throat. Neck: No neck pain  Cardiovascular: Negative for  chest pain. + palpitations Respiratory: + shortness of breath. Gastrointestinal: Negative for abdominal pain, vomiting or diarrhea. Genitourinary: Negative for dysuria. Musculoskeletal: Negative for back pain. Skin: Negative for rash. Neurological: Negative for headaches, weakness or numbness. Psych: No SI or HI  ____________________________________________   PHYSICAL EXAM:  VITAL SIGNS: ED Triage Vitals [11/17/17 1402]  Enc Vitals Group     BP (!) 159/93     Pulse Rate 78     Resp 18     Temp 98.1 F (36.7 C)      Temp Source Oral     SpO2 99 %     Weight 25 lb (11.3 kg)     Height 5\' 5"  (1.651 m)     Head Circumference      Peak Flow      Pain Score 0     Pain Loc      Pain Edu?      Excl. in GC?     Constitutional: Alert and oriented. Well appearing and in no apparent distress. HEENT:      Head: Normocephalic and atraumatic.         Eyes: Conjunctivae are normal. Sclera is non-icteric.       Mouth/Throat: Mucous membranes are moist.       Neck: Supple with no signs of meningismus. Cardiovascular: Regular rate and rhythm. No murmurs, gallops, or rubs. 2+ symmetrical distal pulses are present in all extremities. No JVD. Respiratory: Normal respiratory effort. Lungs are clear to auscultation bilaterally. No wheezes, crackles, or rhonchi.  Gastrointestinal: Soft, non tender, and non distended with positive bowel sounds. No rebound or guarding. Musculoskeletal: Trace b/l pitting edema Neurologic: Normal speech and language. Face is symmetric. Moving all extremities. No gross focal neurologic deficits are appreciated. Skin: Skin is warm, dry and intact. No rash noted. Psychiatric: Mood and affect are normal. Speech and behavior are normal.  ____________________________________________   LABS (all labs ordered are listed, but only abnormal results are displayed)  Labs Reviewed  CBC - Abnormal; Notable for the following components:      Result Value   RDW 15.0 (*)    All other components within normal limits  URINALYSIS, COMPLETE (UACMP) WITH MICROSCOPIC - Abnormal; Notable for the following components:   Color, Urine YELLOW (*)    APPearance CLEAR (*)    All other components within normal limits  FIBRIN DERIVATIVES D-DIMER ([facility] ONLY) - Abnormal; Notable for the following components:   Fibrin derivatives D-dimer ([facility]) 628.27 (*)    All other components within normal limits  BASIC METABOLIC PANEL  TROPONIN I  TSH  POCT PREGNANCY, URINE  POC URINE PREG, ED    ____________________________________________  EKG  ED ECG REPORT I, Nita Sickle, the attending physician, personally viewed and interpreted this ECG.  Normal sinus rhythm, rate of 79, normal intervals, left axis deviation, no ST elevations or depressions.  Unchanged from prior from 2015. ____________________________________________  RADIOLOGY  I have personally reviewed the images performed during this visit and I agree with the Radiologist's read.   Interpretation by Radiologist:  Dg Chest 2 View  Result Date: 11/17/2017 CLINICAL DATA:  Cardiac palpitations and shortness of breath EXAM: CHEST - 2 VIEW COMPARISON:  None. FINDINGS: Lungs are clear. Heart size and pulmonary vascularity are normal. No adenopathy. No bone lesions. IMPRESSION: No edema or consolidation. Electronically Signed   By: Bretta Bang III M.D.   On: 11/17/2017 14:31   Ct Angio Chest Pe W And/or Wo Contrast  Result  Date: 11/17/2017 CLINICAL DATA:  Palpitations, near syncopal episodes. EXAM: CT ANGIOGRAPHY CHEST WITH CONTRAST TECHNIQUE: Multidetector CT imaging of the chest was performed using the standard protocol during bolus administration of intravenous contrast. Multiplanar CT image reconstructions and MIPs were obtained to evaluate the vascular anatomy. CONTRAST:  75mL ISOVUE-370 IOPAMIDOL (ISOVUE-370) INJECTION 76% COMPARISON:  Chest radiograph 11/17/2017 FINDINGS: Cardiovascular: Satisfactory opacification of the pulmonary arteries to the segmental level. No evidence of pulmonary embolism. Normal heart size. No pericardial effusion. Mediastinum/Nodes: No enlarged mediastinal, hilar, or axillary lymph nodes. Thyroid gland, trachea, and esophagus demonstrate no significant findings. Lungs/Pleura: Lungs are clear. No pleural effusion or pneumothorax. Upper Abdomen: No acute abnormality. Musculoskeletal: No chest wall abnormality. No acute or significant osseous findings. Review of the MIP images confirms  the above findings. IMPRESSION: No evidence pulmonary embolus or acute thoracic aortic abnormality. Tortuosity of the aorta, may be due to chronic hypertension. Electronically Signed   By: Ted Mcalpineobrinka  Dimitrova M.D.   On: 11/17/2017 17:44     ____________________________________________   PROCEDURES  Procedure(s) performed: None Procedures Critical Care performed:  None ____________________________________________   INITIAL IMPRESSION / ASSESSMENT AND PLAN / ED COURSE  52 y.o. female with a history of chronic back pain and hypertension who presents for evaluation of palpitations and near syncopal episodes intermittently for the last 2 weeks since receiving an steroid shot in her back for chronic back pain.  Patient will be monitored on telemetry to see if were able to catch any of these episodes of palpitations.  Her EKG shows no evidence of ischemia or dysrhythmias.  Patient does have a pulmonary pattern on EKG with S1 QT T3 however this is unchanged when compared to prior EKG from 2015.  Patient has family history of blood clots in her daughter and therefore she is PERC positive.  Will check a d-dimer to rule out PE.  Labs showing no evidence of anemia, dehydration, or electrolyte abnormalities.  UA with no evidence of infection, protein, dehydration.  TSH with no evidence of thyroid dysfunction.  Troponin negative.  Clinical Course as of Nov 17 1845  Thu Nov 17, 2017  [id number] Initial d-dimer was elevated and patient was sent for CT angiogram of the chest which is negative for any acute findings.  Patient was monitored on telemetry for several hours with no episodes of dysrhythmia noted.  Labs showing no evidence of thyroid dysfunction, cardiac ischemia, electrolyte abnormalities, anemia, dehydration, or kidney dysfunction.  At this time with normal labs and CAT scan and EKG I feel the patient is safe for discharge home and close follow-up with cardiology.  Discussed return precautions for worsening  palpitations, syncope, or chest pain.   [CV]    Clinical Course User Index [CV] Don PerkingVeronese, [place], MD     As part of my medical decision making, I reviewed the following data within the electronic MEDICAL RECORD NUMBER Nursing notes reviewed and incorporated, Labs reviewed , EKG interpreted , Old EKG reviewed, Old chart reviewed, Radiograph reviewed , Notes from prior ED visits and [place] Controlled Substance Database    Pertinent labs & imaging results that were available during my care of the patient were reviewed by me and considered in my medical decision making (see chart for details).    ____________________________________________   FINAL CLINICAL IMPRESSION(S) / ED DIAGNOSES  Final diagnoses:  Palpitations  Near syncope      NEW MEDICATIONS STARTED DURING THIS VISIT:  ED Discharge Orders    None  Note:  This document was prepared using Dragon voice recognition software and may include unintentional dictation errors.    Don Perking, [place], MD 11/17/17 [phone]

## 2017-11-23 ENCOUNTER — Ambulatory Visit: Payer: Medicaid Other

## 2017-11-23 ENCOUNTER — Telehealth: Payer: Self-pay

## 2017-11-23 NOTE — Telephone Encounter (Signed)
No show. Called patient who said that she forgot about the appointment and also does not feel good. Will call back to reschedule. Pt was informed that her current Medicaid authorization ends on 11/28/2017. Pt verbalized understanding. Pt states that she has the clinic's phone number.

## 2018-01-02 DIAGNOSIS — G8929 Other chronic pain: Secondary | ICD-10-CM | POA: Insufficient documentation

## 2018-01-02 DIAGNOSIS — M549 Dorsalgia, unspecified: Secondary | ICD-10-CM | POA: Insufficient documentation
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
## 2018-01-31 DIAGNOSIS — M5136 Other intervertebral disc degeneration, lumbar region: Secondary | ICD-10-CM | POA: Insufficient documentation

## 2018-01-31 DIAGNOSIS — M1711 Unilateral primary osteoarthritis, right knee: Secondary | ICD-10-CM | POA: Insufficient documentation

## 2018-01-31 DIAGNOSIS — Z0289 Encounter for other administrative examinations: Secondary | ICD-10-CM | POA: Insufficient documentation

## 2018-02-02 ENCOUNTER — Ambulatory Visit: Payer: Medicaid Other | Admitting: Student in an Organized Health Care Education/Training Program

## 2018-02-14 ENCOUNTER — Ambulatory Visit: Payer: Medicaid Other | Attending: Nurse Practitioner | Admitting: Physical Therapy

## 2018-02-14 ENCOUNTER — Encounter: Payer: Self-pay | Admitting: Physical Therapy

## 2018-02-14 DIAGNOSIS — R262 Difficulty in walking, not elsewhere classified: Secondary | ICD-10-CM | POA: Diagnosis present

## 2018-02-14 DIAGNOSIS — M25561 Pain in right knee: Secondary | ICD-10-CM | POA: Diagnosis present

## 2018-02-14 DIAGNOSIS — M6281 Muscle weakness (generalized): Secondary | ICD-10-CM | POA: Insufficient documentation

## 2018-02-14 DIAGNOSIS — G8929 Other chronic pain: Secondary | ICD-10-CM | POA: Insufficient documentation

## 2018-02-14 DIAGNOSIS — M5416 Radiculopathy, lumbar region: Secondary | ICD-10-CM | POA: Insufficient documentation

## 2018-02-14 DIAGNOSIS — M25562 Pain in left knee: Secondary | ICD-10-CM | POA: Insufficient documentation

## 2018-02-14 NOTE — Therapy (Signed)
[facility] PHYSICAL AND SPORTS MEDICINE [address], [place], [address] Phone: [phone]   Fax:  [phone]  Physical Therapy Treatment  Patient Details  Name: Ariel Davis MRN: [id number] Date of Birth: 1965/11/08 Referring Provider (PT): Celine Mans, ANP   Encounter Date: 02/14/2018    Past Medical History:  Diagnosis Date  . Anxiety   . Chronic back pain   . H/O degenerative disc disease   . Hypertension   . Sciatica     History reviewed. No pertinent surgical history.  There were no vitals filed for this visit.  Subjective Assessment - 02/14/18 1704    Pertinent History  Patient is a 52 year old female returning to clinic from [place] for bilat knee and low back pain. Patient was seen here 3 months ago with other therapists but reports minimal pain relief from that bout of therapy. Patient returned to [place] "a couple weeks ago" and started on a "pain patch" which she reports no pain relief from, and MD suggested she try aquatic therapy. Patient reports she is beginning a wellness program for weight loss this month as well. She reports MD suggests R knee replacement, but that she "as to lose some weight first". Patient reports chronic knee pain over the past 2 years R>L that she has had injections for, and back pain that has been consistent over the past 2 years "straight across the low back. In the past week patient reports worst back pain 9/10 and best 9/10; worst knee pain 10/10 and best 9/10 that is "worse in the R knee".          OBJECTIVE  SENSATION: Grossly intact to light touch bilateral L as determined by testing dermatomes L2-S2 Proprioception and hot/cold testing deferred on this date   MUSCULOSKELETAL: Tremor: None Bulk: Normal Tone: Normal  Posture Forward trunk lean, decreased hip ext, [facility]  Gait Antalgic gait with forward trunk lean, decreased hip ext bilat   Palpation Increased tension at bilat  lumbar paraspinals with increased pain to palpation  Strength (out of 5) R/L 3+/4 Hip flexion 4-/4- Hip ER 4-/4- Hip IR 3/3+ Hip abduction 3-4- Hip extension 4-/4 Knee extension 4+/5 Knee flexion 4-/4+ Ankle dorsiflexion Pain with all RLE resisted motion   AROM (degrees) Knee Flexion (0-125): R 96d L120d Knee Ext R: -11d L 0d Lumbar flexion 25% limited Lumbar ext almost 100% limited Lumbar rotation 75% limited each way with pain each direction in low back Lumbar lateral bending 75% limited R and L with groin pain  PROM (degrees) Knee flexion R: 105d L: 120d Knee ext R: -9d L :0d  Repeated Movements No centralization or peripheralization of symptoms with repeated lumbar extension or flexion.    Muscle Length Hamstrings: - bilat [place]: + bilat R>L [place]: - bilat  Continued pain with all UPA/UPA to lumbar spine    SPECIAL TESTS SLR: negative Crossed SLR negative FABER: + bilat FADIR: + bilat  Hip scour: + bilat 5xSTS 42sec Stairs: patient ascends/descends 3 steps with step-to pattern, with sideways approach to descent. Requires CGA for safety, and is unable to bring RW up or down steps safely. Reports "someone will bring it inside to her"  Ther-Ex - SLR x5 - Sidelying hip abd x8 - Clamshell red tband x10 - Prone hip ext x5 Printout of therex for HEP given  PT Short Term Goals - 10/31/17 1203      PT SHORT TERM GOAL #1   Title  Patient will be independent with her HEP to promote LE strength, improve ability to ambulate, and perform standing tasks with less back pain.     Baseline  Patient has started a limited home exercise program.     Time  2    Period  Weeks    Status  New    Target Date  11/17/17        PT Long Term Goals - 10/31/17 1150      PT LONG TERM GOAL #1   Title  Patient will have a decrease in back pain to 5/10 or less at worst to promote ability to ambulate and  perform standing tasks.     Baseline  10/10 back pain at worst for the past 3 months (10/31/2017)    Time  4    Period  Weeks    Status  New    Target Date  12/01/17      PT LONG TERM GOAL #2   Title  Patient will have a decrease in bilateral LE symptoms to 5/10 at worst to promote ability to ambulate, perform standing tasks.     Baseline  10/10 bilateral LE symptoms/pain at worst (10/31/2017)    Time  4    Period  Weeks    Status  New    Target Date  12/01/17      PT LONG TERM GOAL #3   Title  Patient will improve bilateral hip extension and abduction strength by at least 1/2 MMT grade to promote ability to ambulate and peform standing tasks with less back and LE pain.     Baseline  hip abduction 4-/5 bilaterally, hip extension 4-/5 bilaterally (10/31/2017)    Time  4    Period  Weeks    Status  New    Target Date  12/01/17      PT LONG TERM GOAL #4   Title  Patient will improve her FOTO score by at least 10 points as a demonstration of improved function.     Baseline  Back FOTO score 20 (10/31/2017)    Time  4    Period  Weeks    Status  New    Target Date  12/01/17              Patient will benefit from skilled therapeutic intervention in order to improve the following deficits and impairments:     Visit Diagnosis: No diagnosis found.     Problem List There are no active problems to display for this patient.  Staci Acosta PT, DPT Staci Acosta 02/14/2018, 5:12 PM  [facility] PHYSICAL AND SPORTS MEDICINE [address] [place], [address] Phone: [phone]   Fax:  [phone]  Name: Ariel Davis MRN: [id number] Date of Birth: 18-Mar-1966

## 2018-02-22 ENCOUNTER — Ambulatory Visit: Payer: Medicaid Other | Admitting: Physical Therapy

## 2018-02-23 ENCOUNTER — Ambulatory Visit: Payer: Medicaid Other

## 2018-03-07 ENCOUNTER — Other Ambulatory Visit: Payer: Self-pay

## 2018-03-07 ENCOUNTER — Ambulatory Visit: Payer: Medicaid Other | Attending: Nurse Practitioner

## 2018-03-07 DIAGNOSIS — M25561 Pain in right knee: Secondary | ICD-10-CM | POA: Insufficient documentation

## 2018-03-07 DIAGNOSIS — M25562 Pain in left knee: Secondary | ICD-10-CM | POA: Diagnosis present

## 2018-03-07 DIAGNOSIS — M5416 Radiculopathy, lumbar region: Secondary | ICD-10-CM | POA: Diagnosis present

## 2018-03-07 DIAGNOSIS — M6281 Muscle weakness (generalized): Secondary | ICD-10-CM | POA: Diagnosis present

## 2018-03-07 DIAGNOSIS — G8929 Other chronic pain: Secondary | ICD-10-CM | POA: Insufficient documentation

## 2018-03-07 DIAGNOSIS — R262 Difficulty in walking, not elsewhere classified: Secondary | ICD-10-CM | POA: Insufficient documentation

## 2018-03-07 NOTE — Therapy (Signed)
[place] [facility] MAIN [facility] SERVICES [address] [place], [place], [address] Phone: [phone]   Fax:  [phone]  Physical Therapy Treatment  Patient Details  Name: Ariel Davis MRN: [id number] Date of Birth: 1965/06/14 Referring Provider (PT): Nicholes Stairs   Encounter Date: 03/07/2018  PT End of Session - 03/07/18 1259    Visit Number  2    Number of Visits  3    Date for PT Re-Evaluation  03/07/18    Authorization Type  1    Authorization Time Period  of 3 medicaid    PT Start Time  1100    PT Stop Time  1145    PT Time Calculation (min)  45 min       Past Medical History:  Diagnosis Date  . Anxiety   . Chronic back pain   . H/O degenerative disc disease   . Hypertension   . Sciatica     History reviewed. No pertinent surgical history.  There were no vitals filed for this visit.  Subjective Assessment - 03/07/18 1255    Subjective  Pt reports B knee and back pain 9/10. Reports R side feels weaker as compared to L.     Pertinent History  Patient is a 52 year old female returning to clinic from [place] for bilat knee and low back pain. Patient was seen here 3 months ago with other therapists but reports minimal pain relief from that bout of therapy. Patient returned to [place] "a couple weeks ago" and started on a "pain patch" which she reports no pain relief from, and MD suggested she try aquatic therapy. Patient reports she is beginning a wellness program for weight loss this month as well. She reports MD suggests R knee replacement, but that she "as to lose some weight first". Patient reports she uses a RW 24/7, since fall last May, but "parked close enough to not use it today ambulating into clinic. Patient reports she is currently unable to work d/t pain and inability to stand long enough to complete work duties. Patient reports she likes to walk, but cannot. Patient reports she is interested in a HEP that is "gentle". Patient reports  chronic knee pain over the past 2 years R>L that she has had injections for, and back pain that has been consistent over the past 2 years "straight across the low back. In the past week patient reports worst back pain 9/10 and best 9/10; worst knee pain 10/10 and best 9/10 that is "worse in the R knee". Pt denies N/V, B&B changes, unexplained weight fluctuation, saddle paresthesia, fever, night sweats, or unrelenting night pain at this time.      Enters/exits via ramp   Ambulation with blue dumbbells  Fwd 4 L increased time  Side 4 L    LE strength (moves slow), cues throughout for core stabilization/posture  Squats, partial range, 20x  hip abd/add, B 20x  hip flex/ext B 20x  Active stretching  Ham/gastroc and quad/hip flexor 3x ea with assist to prevent buoyant overstretch  LB with gentle sweeps, mid R, mid, L 3x ea                          PT Education - 03/07/18 1256    Education provided  Yes    Education Details  Properties and benefits of water as it applies to exercises/walking and posture. Posture, core stabilization, squats, walking, active stretching  Person(s) Educated  Patient    Methods  Explanation;Demonstration       PT Short Term Goals - 02/14/18 1811      PT SHORT TERM GOAL #1   Title  Pt will be independent with HEP in order to improve strength and decrease back/knee pain in order to improve pain-free function at home and work.     Baseline  Patient has started a limited home exercise program.     Time  4    Period  Weeks    Status  New        PT Long Term Goals - 02/14/18 1812      PT LONG TERM GOAL #1   Title  Pt will decrease worst back pain as reported on NPRS by at least 2 points in order to demonstrate clinically significant reduction in back pain.     Baseline  02/14/18 10/10 back pain constant    Time  8    Period  Weeks    Status  New      PT LONG TERM GOAL #2   Title  Pt will decrease worst knee pain as reported on  NPRS by at least 2 points in order to demonstrate clinically significant reduction in back pain.     Baseline  02/13/18: worst knee pain 10/10    Time  8    Period  Weeks    Status  New      PT LONG TERM GOAL #3   Title  Pt will decrease 5TSTS by at least 3 seconds in order to demonstrate clinically significant improvement in LE strength    Baseline  02/13/18 42sec    Time  8    Period  Weeks    Status  New      PT LONG TERM GOAL #4   Title  Patient will increase FOTO score to 43 to demonstrate predicted increase in functional mobility to complete ADLs    Baseline  02/14/18: 30    Time  8    Period  Weeks    Status  New      PT LONG TERM GOAL #5   Title  Patient will safely ascend 3 steps w/o handrail with RW to demonstrate safety entering her home    Baseline  02/14/18: Patient is unable to ascend 3 steps with RW safely, requires CGA for safety negotiating steps without AD    Time  8    Period  Weeks    Status  New            Plan - 03/07/18 1259    Clinical Impression Statement  Pt requires verbal and tactile cues for proper posture and core stabilization. Increased time required for walking to allow for heel/toe pattern and improve stride and fluidity. Pt denies any pain relief in back and knees with chest deep water level.     Rehab Potential  Fair    Clinical Impairments Affecting Rehab Potential  (-) Chronicity of condition, worsening pain, R knee pain, bilateral LE symptoms, sedentary lifestyle (+) motivated    PT Frequency  1x / week    PT Duration  4 weeks    PT Treatment/Interventions  Electrical Stimulation;Iontophoresis 4mg /ml Dexamethasone;Traction;Aquatic Therapy;Ultrasound;Gait training;Therapeutic activities;Therapeutic exercise;Balance training;Neuromuscular re-education;Patient/family education;Manual techniques;Dry needling;Moist Heat;Cryotherapy;Stair training;Passive range of motion;Spinal Manipulations    PT Next Visit Plan  core strengthening/glute  strengthening, gait mechanics, stair training    PT Home Exercise Plan  SLR, prone hip ext, clamshell, sidelying  hip abd    Consulted and Agree with Plan of Care  Patient       Patient will benefit from skilled therapeutic intervention in order to improve the following deficits and impairments:  Pain, Postural dysfunction, Improper body mechanics, Difficulty walking, Decreased strength, Decreased range of motion, Abnormal gait, Increased fascial restricitons, Impaired flexibility, Decreased activity tolerance, Decreased endurance, Hypomobility  Visit Diagnosis: Radiculopathy, lumbar region  Muscle weakness (generalized)  Difficulty in walking, not elsewhere classified  Chronic pain of left knee  Chronic pain of right knee     Problem List There are no active problems to display for this patient.   Scot Dock 03/07/2018, 1:02 PM  [facility] MAIN [facility] SERVICES [address] [place], [place], [address] Phone: [phone]   Fax:  [phone]  Name: RUBYLEE ZAMARRIPA MRN: [id number] Date of Birth: [birth year]

## 2018-03-10 ENCOUNTER — Ambulatory Visit: Payer: Medicaid Other | Admitting: Physical Therapy

## 2018-03-17 ENCOUNTER — Encounter: Payer: Medicaid Other | Admitting: Physical Therapy

## 2018-03-21 ENCOUNTER — Encounter: Payer: Medicaid Other | Admitting: Physical Therapy

## 2018-03-23 ENCOUNTER — Encounter: Payer: Medicaid Other | Admitting: Physical Therapy

## 2018-03-27 ENCOUNTER — Encounter: Payer: Medicaid Other | Admitting: Physical Therapy

## 2018-03-30 ENCOUNTER — Encounter: Payer: Medicaid Other | Admitting: Physical Therapy

## 2018-04-04 ENCOUNTER — Encounter: Payer: Medicaid Other | Admitting: Physical Therapy

## 2018-04-06 ENCOUNTER — Encounter: Payer: Medicaid Other | Admitting: Physical Therapy

## 2018-04-11 ENCOUNTER — Encounter: Payer: Medicaid Other | Admitting: Physical Therapy

## 2018-04-13 ENCOUNTER — Encounter: Payer: Medicaid Other | Admitting: Physical Therapy

## 2018-04-18 ENCOUNTER — Encounter: Payer: Medicaid Other | Admitting: Physical Therapy

## 2018-04-20 ENCOUNTER — Encounter: Payer: Medicaid Other | Admitting: Physical Therapy

## 2018-04-25 ENCOUNTER — Encounter: Payer: Medicaid Other | Admitting: Physical Therapy

## 2018-06-13 DIAGNOSIS — E669 Obesity, unspecified: Secondary | ICD-10-CM | POA: Insufficient documentation

## 2018-08-27 ENCOUNTER — Other Ambulatory Visit: Payer: Self-pay

## 2018-08-27 ENCOUNTER — Emergency Department: Payer: Medicaid Other

## 2018-08-27 ENCOUNTER — Emergency Department
Admission: EM | Admit: 2018-08-27 | Discharge: 2018-08-27 | Disposition: A | Discharge status: Home / Self Care | Payer: Medicaid Other | Attending: Emergency Medicine | Admitting: Emergency Medicine

## 2018-08-27 DIAGNOSIS — I10 Essential (primary) hypertension: Secondary | ICD-10-CM | POA: Insufficient documentation

## 2018-08-27 DIAGNOSIS — Z79899 Other long term (current) drug therapy: Secondary | ICD-10-CM | POA: Insufficient documentation

## 2018-08-27 DIAGNOSIS — R112 Nausea with vomiting, unspecified: Secondary | ICD-10-CM | POA: Diagnosis not present

## 2018-08-27 DIAGNOSIS — E860 Dehydration: Secondary | ICD-10-CM | POA: Insufficient documentation

## 2018-08-27 LAB — COMPREHENSIVE METABOLIC PANEL
ALT: 29 U/L (ref 0–44)
AST: 24 U/L (ref 15–41)
Albumin: 4.3 g/dL (ref 3.5–5.0)
Alkaline Phosphatase: 61 U/L (ref 38–126)
Anion gap: 7 (ref 5–15)
BUN: 17 mg/dL (ref 6–20)
CO2: 30 mmol/L (ref 22–32)
Calcium: 9.8 mg/dL (ref 8.9–10.3)
Chloride: 104 mmol/L (ref 98–111)
Creatinine, Ser: 0.77 mg/dL (ref 0.44–1.00)
GFR calc Af Amer: >60 mL/min (ref >60)
GFR calc non Af Amer: >60 mL/min (ref >60)
Glucose, Bld: 101 mg/dL — ABNORMAL HIGH (ref 70–99)
Potassium: 3.8 mmol/L (ref 3.5–5.1)
Sodium: 141 mmol/L (ref 135–145)
Total Bilirubin: 0.7 mg/dL (ref 0.3–1.2)
Total Protein: 7.6 g/dL (ref 6.5–8.1)

## 2018-08-27 LAB — URINALYSIS, COMPLETE (UACMP) WITH MICROSCOPIC
Bacteria, UA: NONE SEEN
Bilirubin Urine: NEGATIVE
Glucose, UA: NEGATIVE mg/dL
Hgb urine dipstick: NEGATIVE
Ketones, ur: NEGATIVE mg/dL
Leukocytes,Ua: NEGATIVE
Nitrite: NEGATIVE
Protein, ur: NEGATIVE mg/dL
Specific Gravity, Urine: 1.017 (ref 1.005–1.030)
Squamous Epithelial / HPF: NONE SEEN (ref 0–5)
pH: 8 (ref 5.0–8.0)

## 2018-08-27 LAB — CBC
HCT: 44.6 % (ref 36.0–46.0)
Hemoglobin: 13.2 g/dL (ref 12.0–15.0)
MCH: 25.6 pg — ABNORMAL LOW (ref 26.0–34.0)
MCHC: 29.6 g/dL — ABNORMAL LOW (ref 30.0–36.0)
MCV: 86.6 fL (ref 80.0–100.0)
Platelets: 254 10*3/uL (ref 150–400)
RBC: 5.15 MIL/uL — ABNORMAL HIGH (ref 3.87–5.11)
RDW: 14.5 % (ref 11.5–15.5)
WBC: 5 10*3/uL (ref 4.0–10.5)
nRBC: 0 % (ref 0.0–0.2)

## 2018-08-27 LAB — ETHANOL: Alcohol, Ethyl (B): <10 mg/dL (ref <10)

## 2018-08-27 LAB — LIPASE, BLOOD: Lipase: 25 U/L (ref 11–51)

## 2018-08-27 MED ORDER — ONDANSETRON HCL 4 MG/2ML IJ SOLN
4.0000 mg | Freq: Once | INTRAMUSCULAR | Status: AC
  Administered 2018-08-27: 15:00:00 4 mg via INTRAVENOUS
  Filled 2018-08-27: qty 2

## 2018-08-27 MED ORDER — ONDANSETRON 4 MG PO TBDP
4.0000 mg | ORAL_TABLET | Freq: Once | ORAL | Status: AC | PRN
  Administered 2018-08-27: 13:00:00 4 mg via ORAL
  Filled 2018-08-27: qty 1

## 2018-08-27 MED ORDER — SODIUM CHLORIDE 0.9 % IV BOLUS
1000.0000 mL | Freq: Once | INTRAVENOUS | Status: AC
  Administered 2018-08-27: 1000 mL via INTRAVENOUS

## 2018-08-27 MED ORDER — SODIUM CHLORIDE 0.9% FLUSH: 3.0000 mL | Freq: Once | INTRAVENOUS | Status: DC

## 2018-08-27 NOTE — ED Notes (Signed)
Pt ambulatory to toilet with steady gait.  

## 2018-08-27 NOTE — ED Notes (Signed)
Per Dr Mayford Knife- pt will get the rest of her fluids before discharge

## 2018-08-27 NOTE — Discharge Instructions (Addendum)
Follow-up with your regular doctor if not better in 3 days.  Return emergency department worsening.  Drink plenty fluids.  Take nausea medicine as directed.

## 2018-08-27 NOTE — ED Provider Notes (Signed)
Patient looks well, is in no acute distress and having only minimal nausea.  Her labs are reassuring and she has no focal abdominal tenderness.  She is cleared for outpatient follow-up.   Emily Filbert, MD 08/27/18 [phone]

## 2018-08-27 NOTE — ED Notes (Signed)
Pt started having n/v late Friday- states she has not been able to keep any food or drink down- pt states she feels "full"- pt denies being in contact with anyone sick

## 2018-08-27 NOTE — ED Provider Notes (Signed)
[facility] Emergency Department Provider Note  ____________________________________________   None    (approximate)  I have reviewed the triage vital signs and the nursing notes.   HISTORY  Chief Complaint Emesis and Weakness    HPI Ariel Davis is a 53 y.o. female presents emergency department complaining of nausea and vomiting since Friday.  Complaining of weakness and dizziness for 2 days.  States she has difficulty walking because she is unsteady.  No regular bowel movement.  Patient is actively vomiting in the exam room.    Past Medical History:  Diagnosis Date  . Anxiety   . Chronic back pain   . H/O degenerative disc disease   . Hypertension   . Sciatica     There are no active problems to display for this patient.   History reviewed. No pertinent surgical history.  Prior to Admission medications   Medication Sig Start Date End Date Taking? Authorizing Provider  amLODipine (NORVASC) 5 MG tablet Take 5 mg by mouth daily. 07/12/16   [provider]  amoxicillin-clavulanate (AUGMENTIN) 875-125 MG tablet Take 1 tablet by mouth 2 (two) times daily. 09/18/17   Kem Boroughs B, FNP  cyclobenzaprine (FLEXERIL) 5 MG tablet Take 1-2 tablets 3 times daily as needed 08/19/17   Enid Derry, PA-C  gabapentin (NEURONTIN) 300 MG capsule Take 800 mg 3 (three) times daily by mouth.    [provider]  ketorolac (TORADOL) 10 MG tablet Take 1 tablet (10 mg total) by mouth every 6 (six) hours as needed. 08/19/17   Enid Derry, PA-C  lisinopril-hydrochlorothiazide (PRINZIDE,ZESTORETIC) 20-12.5 MG tablet Take 1 tablet by mouth daily. 07/12/16   [provider]  methylPREDNISolone (MEDROL DOSEPAK) 4 MG TBPK tablet Take 6 pills on day one then decrease by 1 pill each day 03/07/17   Faythe Ghee, PA-C  rOPINIRole (REQUIP) 0.5 MG tablet Take 1 tablet (0.5 mg total) by mouth at bedtime. 05/17/17   Sherrie Mustache Roselyn Bering, PA-C  venlafaxine XR  (EFFEXOR-XR) 37.5 MG 24 hr capsule Take 37.5 mg by mouth daily with breakfast.    [provider]    Allergies Oxycodone and Duloxetine  No family history on file.  Social History Social History   Tobacco Use  . Smoking status: Never Smoker  . Smokeless tobacco: Never Used  Substance Use Topics  . Alcohol use: No  . Drug use: No    Review of Systems  Constitutional: No fever/chills, weakness Eyes: No visual changes. ENT: No sore throat. Respiratory: Denies cough Gastrointestinal: Positive for vomiting and nausea, weakness Genitourinary: Negative for dysuria. Musculoskeletal: Negative for back pain. Skin: Negative for rash.    ____________________________________________   PHYSICAL EXAM:  VITAL SIGNS: ED Triage Vitals [08/27/18 1323]  Enc Vitals Group     BP      Pulse      Resp      Temp      Temp src      SpO2      Weight 240 lb (108.9 kg)     Height 5\' 7"  (1.702 m)     Head Circumference      Peak Flow      Pain Score 0     Pain Loc      Pain Edu?      Excl. in GC?     Constitutional: Alert and oriented. Well appearing and in no acute distress.  Patient does walk as if she is drunk Eyes: Conjunctivae are normal.  Head: Atraumatic. Nose: No congestion/rhinnorhea. Mouth/Throat: Mucous membranes are moist.   Neck:  supple no lymphadenopathy noted Cardiovascular: Normal rate, regular rhythm. Heart sounds are normal Respiratory: Normal respiratory effort.  No retractions, lungs c t a  Abd: soft nontender bs normal all 4 quad GU: deferred Musculoskeletal: FROM all extremities, warm and well perfused Neurologic:  Normal speech and language.  Skin:  Skin is warm, dry and intact. No rash noted. Psychiatric: Mood and affect are normal. Speech and behavior are normal.  ____________________________________________   LABS (all labs ordered are listed, but only abnormal results are displayed)  Labs Reviewed  COMPREHENSIVE METABOLIC PANEL -  Abnormal; Notable for the following components:      Result Value   Glucose, Bld 101 (*)    All other components within normal limits  CBC - Abnormal; Notable for the following components:   RBC 5.15 (*)    MCH 25.6 (*)    MCHC 29.6 (*)    All other components within normal limits  URINALYSIS, COMPLETE (UACMP) WITH MICROSCOPIC - Abnormal; Notable for the following components:   Color, Urine YELLOW (*)    APPearance HAZY (*)    All other components within normal limits  LIPASE, BLOOD  ETHANOL   ____________________________________________   ____________________________________________  RADIOLOGY  KUB is normal  ____________________________________________   PROCEDURES  Procedure(s) performed: Saline lock, 1 L daily, Zofran 4 mg IV   Procedures    ____________________________________________   INITIAL IMPRESSION / ASSESSMENT AND PLAN / ED COURSE  Pertinent labs & imaging results that were available during my care of the patient were reviewed by me and considered in my medical decision making (see chart for details).   Patient is 53 year old female presents emergency department with complaints of nausea/vomiting/diarrhea along with weakness due to being dehydrated.  Physical exam patient appears to have difficulty walking to the toilet.  However when she turned around to get her down she walks perfectly normal.  Remainder of exam is unremarkable  Urinalysis is normal, CBC is normal, EtOH level is normal, lipase normal, comprehensive metabolic panel is normal,  Explained test results to the patient.  She states she feels much better after the fluids and Zofran.  She was encouraged to continue drinking plenty of fluids.  Return emergency department worsening.  She currently has a prescription for Zofran and I encouraged her to continue take medication if needed.  She states she understands will comply.  She discharged stable condition.    Ariel Davis was evaluated  in Emergency Department on 08/27/2018 for the symptoms described in the history of present illness. She was evaluated in the context of the global COVID-19 pandemic, which necessitated consideration that the patient might be at risk for infection with the SARS-CoV-2 virus that causes COVID-19. Institutional protocols and algorithms that pertain to the evaluation of patients at risk for COVID-19 are in a state of rapid change based on information released by regulatory bodies including the CDC and federal and state organizations. These policies and algorithms were followed during the patient's care in the ED.   As part of my medical decision making, I reviewed the following data within the electronic MEDICAL RECORD NUMBER Nursing notes reviewed and incorporated, Labs reviewed see above, Old chart reviewed, Evaluated by EM attending Dr. Mayford KnifeWilliams, Notes from prior ED visits and  Controlled Substance Database  ____________________________________________   FINAL CLINICAL IMPRESSION(S) / ED DIAGNOSES  Final diagnoses:  Intractable vomiting with nausea, unspecified vomiting type  Dehydration  NEW MEDICATIONS STARTED DURING THIS VISIT:  Discharge Medication List as of 08/27/2018  3:24 PM       Note:  This document was prepared using Dragon voice recognition software and may include unintentional dictation errors.    Faythe Ghee, PA-C 08/27/18 1733    Emily Filbert, MD 08/27/18 [phone]

## 2018-08-27 NOTE — ED Notes (Signed)
Upon entering room to assess pt- pt was bent over toilet throwing up

## 2018-08-27 NOTE — ED Triage Notes (Signed)
Pt c/o N/V since Friday. Pt c/io weakness and dizziness for the past couple of days. Pt has unsteady gait. Placed in wheelchair.

## 2018-12-14 ENCOUNTER — Other Ambulatory Visit: Payer: Self-pay

## 2018-12-14 DIAGNOSIS — Z20822 Contact with and (suspected) exposure to covid-19: Secondary | ICD-10-CM

## 2018-12-15 LAB — NOVEL CORONAVIRUS, NAA: SARS-CoV-2, NAA: NOT DETECTED

## 2019-01-16 ENCOUNTER — Encounter: Payer: Self-pay | Admitting: Podiatry

## 2019-01-16 ENCOUNTER — Ambulatory Visit (INDEPENDENT_AMBULATORY_CARE_PROVIDER_SITE_OTHER): Payer: Medicaid Other | Admitting: Podiatry

## 2019-01-16 ENCOUNTER — Other Ambulatory Visit: Payer: Self-pay | Admitting: Podiatry

## 2019-01-16 ENCOUNTER — Ambulatory Visit (INDEPENDENT_AMBULATORY_CARE_PROVIDER_SITE_OTHER): Payer: Medicaid Other

## 2019-01-16 ENCOUNTER — Other Ambulatory Visit: Payer: Self-pay

## 2019-01-16 DIAGNOSIS — G5792 Unspecified mononeuropathy of left lower limb: Secondary | ICD-10-CM

## 2019-01-16 DIAGNOSIS — G5762 Lesion of plantar nerve, left lower limb: Secondary | ICD-10-CM

## 2019-01-16 DIAGNOSIS — M722 Plantar fascial fibromatosis: Secondary | ICD-10-CM

## 2019-01-16 MED ORDER — METHYLPREDNISOLONE 4 MG PO TBPK: ORAL_TABLET | ORAL | 0 refills | Status: DC

## 2019-01-21 NOTE — Progress Notes (Signed)
   HPI: 53 y.o. female presenting today as a new patient with a chief complaint of intermittent burning left foot pain that began three months ago. She reports an initial injury to her hip two years ago. She notes the pain is now in the lateral aspect of the foot and occurs 3-4 times daily. She has been using ice therapy, applying Icy Hot and taking Ibuprofen for treatment. There are no modifying factors noted. Patient is here for further evaluation and treatment.   Past Medical History:  Diagnosis Date  . Anxiety   . Chronic back pain   . H/O degenerative disc disease   . Hypertension   . Sciatica      Physical Exam: General: The patient is alert and oriented x3 in no acute distress.  Dermatology: Skin is warm, dry and supple bilateral lower extremities. Negative for open lesions or macerations.  Vascular: Palpable pedal pulses bilaterally. No edema or erythema noted. Capillary refill within normal limits.  Neurological: Epicritic and protective threshold grossly intact bilaterally.   Musculoskeletal Exam: Epicritic and protective threshold intact.  Paresthesia with burning, shooting pain noted to the lateral left foot. Range of motion within normal limits to all pedal and ankle joints bilateral. Muscle strength 5/5 in all groups bilateral.   Radiographic Exam:  Normal osseous mineralization. Joint spaces preserved. No fracture/dislocation/boney destruction.    Assessment: 1. Sural nerve neuritis left lateral foot   Plan of Care:  1. Patient evaluated. X-Rays reviewed.  2. Injection of 0.5 mLs Celestone Soluspan injected into the lateral left foot.  3. Recommended soft fitting shoes.  4. Prescription for Medrol Dose Pak provided to patient. 5. Return to clinic in 4 weeks.       Edrick Kins, DPM [facility]  Dr. Edrick Kins, DPM    [address], [place] [address]                Office [phone]   Fax [phone]

## 2019-02-16 ENCOUNTER — Ambulatory Visit (INDEPENDENT_AMBULATORY_CARE_PROVIDER_SITE_OTHER): Payer: Medicaid Other | Admitting: Podiatry

## 2019-02-16 ENCOUNTER — Other Ambulatory Visit: Payer: Self-pay

## 2019-02-16 ENCOUNTER — Encounter: Payer: Self-pay | Admitting: Podiatry

## 2019-02-16 DIAGNOSIS — G5762 Lesion of plantar nerve, left lower limb: Secondary | ICD-10-CM | POA: Diagnosis not present

## 2019-02-16 DIAGNOSIS — G5792 Unspecified mononeuropathy of left lower limb: Secondary | ICD-10-CM

## 2019-02-20 NOTE — Progress Notes (Signed)
   HPI: 53 y.o. female presenting today for follow up evaluation of neuritis of the left lateral foot. She states she is doing well. She denies any pain or modifying factors. She has taken the Medrol Dose Pak and has been wearing supportive shoes as directed. Patient is here for further evaluation and treatment.   Past Medical History:  Diagnosis Date  . Anxiety   . Chronic back pain   . H/O degenerative disc disease   . Hypertension   . Sciatica      Physical Exam: General: The patient is alert and oriented x3 in no acute distress.  Dermatology: Skin is warm, dry and supple bilateral lower extremities. Negative for open lesions or macerations.  Vascular: Palpable pedal pulses bilaterally. No edema or erythema noted. Capillary refill within normal limits.  Neurological: Epicritic and protective threshold grossly intact bilaterally.   Musculoskeletal Exam: Range of motion within normal limits to all pedal and ankle joints bilateral. Muscle strength 5/5 in all groups bilateral.     Assessment: 1. Sural nerve neuritis left lateral foot - resolved    Plan of Care:  1. Patient evaluated.  2. Recommended good shoe gear.  3. Return to clinic as needed.       Edrick Kins, DPM [facility]  Dr. Edrick Kins, DPM    [address], [place] [address]                Office [phone]  Fax [phone]

## 2019-03-05 ENCOUNTER — Emergency Department
Admission: EM | Admit: 2019-03-05 | Discharge: 2019-03-05 | Disposition: A | Discharge status: Home / Self Care | Payer: Medicaid Other | Attending: Emergency Medicine | Admitting: Emergency Medicine

## 2019-03-05 ENCOUNTER — Encounter: Payer: Self-pay | Admitting: Emergency Medicine

## 2019-03-05 ENCOUNTER — Other Ambulatory Visit: Payer: Self-pay

## 2019-03-05 DIAGNOSIS — Z79899 Other long term (current) drug therapy: Secondary | ICD-10-CM | POA: Insufficient documentation

## 2019-03-05 DIAGNOSIS — M545 Low back pain, unspecified: Secondary | ICD-10-CM

## 2019-03-05 DIAGNOSIS — I10 Essential (primary) hypertension: Secondary | ICD-10-CM | POA: Diagnosis not present

## 2019-03-05 MED ORDER — KETOROLAC TROMETHAMINE 10 MG PO TABS: 10.0000 mg | ORAL_TABLET | Freq: Four times a day (QID) | ORAL | 0 refills | Status: DC | PRN

## 2019-03-05 MED ORDER — HYDROCODONE-ACETAMINOPHEN 5-325 MG PO TABS
1.0000 | ORAL_TABLET | Freq: Once | ORAL | Status: AC
  Administered 2019-03-05: 1 via ORAL
  Filled 2019-03-05: qty 1

## 2019-03-05 MED ORDER — KETOROLAC TROMETHAMINE 30 MG/ML IJ SOLN
30.0000 mg | Freq: Once | INTRAMUSCULAR | Status: AC
  Administered 2019-03-05: 30 mg via INTRAMUSCULAR
  Filled 2019-03-05: qty 1

## 2019-03-05 MED ORDER — ORPHENADRINE CITRATE 30 MG/ML IJ SOLN
60.0000 mg | Freq: Two times a day (BID) | INTRAMUSCULAR | Status: DC
  Administered 2019-03-05: 20:00:00 60 mg via INTRAMUSCULAR
  Filled 2019-03-05: qty 2

## 2019-03-05 MED ORDER — BACLOFEN 10 MG PO TABS: 10.0000 mg | ORAL_TABLET | Freq: Every day | ORAL | 1 refills | Status: DC

## 2019-03-05 NOTE — ED Triage Notes (Signed)
Low back pain radiating to both legs. Denies fall or injury. Does have history of sciatica.

## 2019-03-05 NOTE — Discharge Instructions (Addendum)
Follow-up with your regular doctor if not better in 3 to 5 days.  Return emergency department worsening.  Take medications as prescribed.

## 2019-03-05 NOTE — ED Provider Notes (Signed)
[facility] Emergency Department Provider Note  ____________________________________________   First MD Initiated Contact with Patient 03/05/19 1928     (approximate)  I have reviewed the triage vital signs and the nursing notes.   HISTORY  Chief Complaint Back Pain    HPI Ariel Davis is a 53 y.o. female  C/o low back pain for 2 day, possible known injury, states that she went shopping all day Friday and Saturday and now her lower back and buttocks hurt.  Pain is worse with movement, increased with bending over, denies numbness, tingling, or changes in bowel/urinary habits,  Using otc meds without relief Remainder ros neg   Past Medical History:  Diagnosis Date  . Anxiety   . Chronic back pain   . H/O degenerative disc disease   . Hypertension   . Sciatica     Patient Active Problem List   Diagnosis Date Noted  . Class 2 obesity 06/13/2018  . Arthritis of right knee 01/31/2018  . DDD (degenerative disc disease), lumbar 01/31/2018  . Pain medication agreement signed 01/31/2018  . Chronic back pain 01/02/2018  . Pain in both feet 08/31/2017  . Knee pain, bilateral 07/05/2017  . Depression 06/07/2017  . OSA (obstructive sleep apnea) 05/12/2017  . Closed nondisplaced fracture of anterior wall of left acetabulum (HCC) 08/27/2016  . Localized osteoarthritis of right knee 08/27/2016  . Closed fracture of posterior wall of left acetabulum (HCC) 08/23/2016  . HTN (hypertension) 06/21/2016  . Allergic rhinitis 12/06/2013  . Microcytic hypochromic anemia 12/06/2013    History reviewed. No pertinent surgical history.  Prior to Admission medications   Medication Sig Start Date End Date Taking? Authorizing Provider  amLODipine (NORVASC) 5 MG tablet Take 5 mg by mouth daily. 07/12/16   [provider]  baclofen (LIORESAL) 10 MG tablet Take 1 tablet (10 mg total) by mouth daily. 03/05/19 03/04/20  Sherrie Mustache, Roselyn Bering, PA-C  BELBUCA 75 MCG FILM  APPLY 75 MCG TO CHEEK TWICE DAILY FOR CHRONIC PAIN. REPLACING PATCH 12/25/18   [provider]  Buprenorphine 15 MCG/HR PTWK APPLY 1 PATCH TOPICALLY TO SKIN ONCE A WEEK 08/21/18   [provider]  cyclobenzaprine (FLEXERIL) 5 MG tablet Take 1-2 tablets 3 times daily as needed 08/19/17   Enid Derry, PA-C  diclofenac sodium (VOLTAREN) 1 % GEL APPLY 2 TO 4 GRAMS TOPICALLY 4 TIMES DAILY 08/02/18   [provider]  gabapentin (NEURONTIN) 300 MG capsule Take 800 mg 3 (three) times daily by mouth.    [provider]  hydrOXYzine (ATARAX/VISTARIL) 25 MG tablet Can take 1-2 tablets 3 times a day as needed for anxiety 01/02/18   [provider]  ketorolac (TORADOL) 10 MG tablet Take 1 tablet (10 mg total) by mouth every 6 (six) hours as needed. 03/05/19   Mahogony Gilchrest, Roselyn Bering, PA-C  lisinopril-hydrochlorothiazide (PRINZIDE,ZESTORETIC) 20-12.5 MG tablet Take 1 tablet by mouth daily. 07/12/16   [provider]  meloxicam (MOBIC) 15 MG tablet Take 15 mg by mouth daily. 07/20/18   [provider]  methylPREDNISolone (MEDROL DOSEPAK) 4 MG TBPK tablet 6 day dose pack - take as directed 01/16/19   Felecia Shelling, DPM  omeprazole (PRILOSEC) 20 MG capsule Take by mouth. 03/19/18 03/20/19  [provider]  ondansetron (ZOFRAN) 4 MG tablet Take by mouth. 08/25/18 08/25/19  [provider]  rOPINIRole (REQUIP) 0.5 MG tablet Take 1 tablet (0.5 mg total) by mouth at bedtime. 05/17/17   Faythe Ghee,  PA-C  topiramate (TOPAMAX) 50 MG tablet Take 50 mg by mouth at bedtime. 08/01/18   [provider]  venlafaxine XR (EFFEXOR-XR) 37.5 MG 24 hr capsule Take 37.5 mg by mouth daily with breakfast.    [provider]  Vitamin D, Ergocalciferol, (DRISDOL) 1.25 MG (50000 UT) CAPS capsule Take 50,000 Units by mouth once a week. 08/23/18   [provider]    Allergies Amoxicillin, Oxycodone, and Duloxetine  No family history on file.   Social History Social History   Tobacco Use  . Smoking status: Never Smoker  . Smokeless tobacco: Never Used  Substance Use Topics  . Alcohol use: No  . Drug use: No    Review of Systems  Constitutional: No fever/chills Eyes: No visual changes. ENT: No sore throat. Respiratory: Denies cough Genitourinary: Negative for dysuria. Musculoskeletal: Positive for back pain. Skin: Negative for rash.    ____________________________________________   PHYSICAL EXAM:  VITAL SIGNS: ED Triage Vitals  Enc Vitals Group     BP 03/05/19 1909 (!) 147/80     Pulse Rate 03/05/19 1909 69     Resp 03/05/19 1909 20     Temp 03/05/19 1909 98.4 F (36.9 C)     Temp Source 03/05/19 1909 Oral     SpO2 03/05/19 1909 98 %     Weight 03/05/19 1911 245 lb (111.1 kg)     Height 03/05/19 1911 5\' 6"  (1.676 m)     Head Circumference --      Peak Flow --      Pain Score 03/05/19 1911 10     Pain Loc --      Pain Edu? --      Excl. in GC? --     Constitutional: Alert and oriented. Well appearing and in no acute distress. Eyes: Conjunctivae are normal.  Head: Atraumatic. Nose: No congestion/rhinnorhea. Mouth/Throat: Mucous membranes are moist.   Neck:  supple no lymphadenopathy noted Cardiovascular: Normal rate, regular rhythm.  Respiratory: Normal respiratory effort.  No retractions,  Abd: soft nontender bs normal all 4 quad GU: deferred Musculoskeletal: FROM all extremities, warm and well perfused.  Decreased rom of back due to discomfort, lumbar spine nontender, unable to assess slr, 5/5 strength in great toes b/l, 5/5 strength in lower legs, n/v intact, the gluteus muscle on the left side is extremely tender Neurologic:  Normal speech and language.  Skin:  Skin is warm, dry and intact. No rash noted. Psychiatric: Mood and affect are normal. Speech and behavior are normal.  ____________________________________________   LABS (all labs ordered are listed, but only abnormal results are  displayed)  Labs Reviewed - No data to display ____________________________________________   ____________________________________________  RADIOLOGY    ____________________________________________   PROCEDURES  Procedure(s) performed: Norflex 60 mg IM, Toradol 30 mg IM, Vicodin 1 p.o.   Procedures    ____________________________________________   INITIAL IMPRESSION / ASSESSMENT AND PLAN / ED COURSE  Pertinent labs & imaging results that were available during my care of the patient were reviewed by me and considered in my medical decision making (see chart for details).   Patient is 53 year old female presents emergency department complaining of low back pain caused by extended shopping the other day.  Physical exam shows patient to be uncomfortable.  She is very tender in the left gluteal muscle.  Spine is basically nontender.  Remainder of exam is unremarkable  Norflex 60 mg IM, Toradol 30 mg IM, Vicodin 1 p.o.  Wait and assess the  patient after she said some pain medication.  See if some of this spasms have resolved.   Patient had complete pain relief with medications.  She be discharged with a prescription for Toradol 10 mg every 6 hours and baclofen.  She is to return if worsening.  Follow with regular doctor if not better in 3 days.  As part of my medical decision making, I reviewed the following data within the [place] notes reviewed and incorporated, Old chart reviewed, Notes from prior ED visits and [place] Controlled Substance Database  ____________________________________________   FINAL CLINICAL IMPRESSION(S) / ED DIAGNOSES  Final diagnoses:  Acute left-sided low back pain without sciatica      NEW MEDICATIONS STARTED DURING THIS VISIT:  New Prescriptions   BACLOFEN (LIORESAL) 10 MG TABLET    Take 1 tablet (10 mg total) by mouth daily.   KETOROLAC (TORADOL) 10 MG TABLET    Take 1 tablet (10 mg total) by mouth every 6 (six)  hours as needed.     Note:  This document was prepared using Dragon voice recognition software and may include unintentional dictation errors.     Versie Starks, PA-C 03/05/19 2150    Blake Divine, MD 03/08/19 [phone]

## 2019-04-28 ENCOUNTER — Emergency Department
Admission: EM | Admit: 2019-04-28 | Discharge: 2019-04-28 | Disposition: A | Discharge status: Home / Self Care | Payer: Medicaid Other | Attending: Student | Admitting: Student

## 2019-04-28 ENCOUNTER — Other Ambulatory Visit: Payer: Self-pay

## 2019-04-28 DIAGNOSIS — Z20822 Contact with and (suspected) exposure to covid-19: Secondary | ICD-10-CM | POA: Insufficient documentation

## 2019-04-28 DIAGNOSIS — I10 Essential (primary) hypertension: Secondary | ICD-10-CM | POA: Insufficient documentation

## 2019-04-28 DIAGNOSIS — U071 COVID-19: Secondary | ICD-10-CM | POA: Diagnosis not present

## 2019-04-28 DIAGNOSIS — Z79899 Other long term (current) drug therapy: Secondary | ICD-10-CM | POA: Diagnosis not present

## 2019-04-28 DIAGNOSIS — R109 Unspecified abdominal pain: Secondary | ICD-10-CM | POA: Diagnosis present

## 2019-04-28 DIAGNOSIS — B349 Viral infection, unspecified: Secondary | ICD-10-CM

## 2019-04-28 LAB — COMPREHENSIVE METABOLIC PANEL
ALT: 37 U/L (ref 0–44)
AST: 28 U/L (ref 15–41)
Albumin: 4.1 g/dL (ref 3.5–5.0)
Alkaline Phosphatase: 52 U/L (ref 38–126)
Anion gap: 8 (ref 5–15)
BUN: 16 mg/dL (ref 6–20)
CO2: 27 mmol/L (ref 22–32)
Calcium: 9.2 mg/dL (ref 8.9–10.3)
Chloride: 107 mmol/L (ref 98–111)
Creatinine, Ser: 0.9 mg/dL (ref 0.44–1.00)
GFR calc Af Amer: >60 mL/min (ref >60)
GFR calc non Af Amer: >60 mL/min (ref >60)
Glucose, Bld: 90 mg/dL (ref 70–99)
Potassium: 3.6 mmol/L (ref 3.5–5.1)
Sodium: 142 mmol/L (ref 135–145)
Total Bilirubin: 0.8 mg/dL (ref 0.3–1.2)
Total Protein: 8 g/dL (ref 6.5–8.1)

## 2019-04-28 LAB — CBC WITH DIFFERENTIAL/PLATELET
Abs Immature Granulocytes: 0 10*3/uL (ref 0.00–0.07)
Basophils Absolute: 0 10*3/uL (ref 0.0–0.1)
Basophils Relative: 1 %
Eosinophils Absolute: 0 10*3/uL (ref 0.0–0.5)
Eosinophils Relative: 0 %
HCT: 46.9 % — ABNORMAL HIGH (ref 36.0–46.0)
Hemoglobin: 13.8 g/dL (ref 12.0–15.0)
Immature Granulocytes: 0 %
Lymphocytes Relative: 58 %
Lymphs Abs: 1.7 10*3/uL (ref 0.7–4.0)
MCH: 26 pg (ref 26.0–34.0)
MCHC: 29.4 g/dL — ABNORMAL LOW (ref 30.0–36.0)
MCV: 88.5 fL (ref 80.0–100.0)
Monocytes Absolute: 0.4 10*3/uL (ref 0.1–1.0)
Monocytes Relative: 14 %
Neutro Abs: 0.8 10*3/uL — ABNORMAL LOW (ref 1.7–7.7)
Neutrophils Relative %: 27 %
Platelets: 218 10*3/uL (ref 150–400)
RBC: 5.3 MIL/uL — ABNORMAL HIGH (ref 3.87–5.11)
RDW: 13.3 % (ref 11.5–15.5)
Smear Review: NORMAL
WBC: 2.9 10*3/uL — ABNORMAL LOW (ref 4.0–10.5)
nRBC: 0 % (ref 0.0–0.2)

## 2019-04-28 LAB — URINALYSIS, COMPLETE (UACMP) WITH MICROSCOPIC
Bacteria, UA: NONE SEEN
Bilirubin Urine: NEGATIVE
Glucose, UA: NEGATIVE mg/dL
Hgb urine dipstick: NEGATIVE
Ketones, ur: NEGATIVE mg/dL
Leukocytes,Ua: NEGATIVE
Nitrite: NEGATIVE
Protein, ur: NEGATIVE mg/dL
Specific Gravity, Urine: 1.017 (ref 1.005–1.030)
pH: 5 (ref 5.0–8.0)

## 2019-04-28 LAB — LIPASE, BLOOD: Lipase: 29 U/L (ref 11–51)

## 2019-04-28 MED ORDER — KETOROLAC TROMETHAMINE 10 MG PO TABS: 10.0000 mg | ORAL_TABLET | Freq: Four times a day (QID) | ORAL | 0 refills | Status: DC | PRN

## 2019-04-28 MED ORDER — ORPHENADRINE CITRATE 30 MG/ML IJ SOLN: 60.0000 mg | Freq: Two times a day (BID) | INTRAMUSCULAR | Status: DC

## 2019-04-28 MED ORDER — METHOCARBAMOL 750 MG PO TABS: 750.0000 mg | ORAL_TABLET | Freq: Four times a day (QID) | ORAL | 0 refills | Status: DC

## 2019-04-28 MED ORDER — KETOROLAC TROMETHAMINE 30 MG/ML IJ SOLN
30.0000 mg | Freq: Once | INTRAMUSCULAR | Status: AC
  Administered 2019-04-28: 30 mg via INTRAVENOUS
  Filled 2019-04-28: qty 1

## 2019-04-28 MED ORDER — ORPHENADRINE CITRATE 30 MG/ML IJ SOLN
60.0000 mg | Freq: Two times a day (BID) | INTRAMUSCULAR | Status: DC
  Filled 2019-04-28: qty 2

## 2019-04-28 NOTE — ED Provider Notes (Signed)
[facility] Emergency Department Provider Note   ____________________________________________   First MD Initiated Contact with Patient 04/28/19 1126     (approximate)  I have reviewed the triage vital signs and the nursing notes.   HISTORY  Chief Complaint Back Pain    HPI Ariel Davis is a 54 y.o. female patient states also the body aches that started on 04/26/2019.  Patient thought it was because and start taking over-the-counter TheraFlu.  Patient that she has developed bilateral flank pain radiating to the abdomen.  Patient states she feel like she has been "hit by a truck".  Patient denies nausea, vomiting, diarrhea.  Patient denies recent travel or known exposure to COVID-19.  Patient she tested negative COVID-19 in October 2020.  Patient has a history of chronic back pain secondary to degenerative changes.  Patient rates her pain as 8/10.  Patient described pain as"achy".         Past Medical History:  Diagnosis Date  . Anxiety   . Chronic back pain   . H/O degenerative disc disease   . Hypertension   . Sciatica     Patient Active Problem List   Diagnosis Date Noted  . Class 2 obesity 06/13/2018  . Arthritis of right knee 01/31/2018  . DDD (degenerative disc disease), lumbar 01/31/2018  . Pain medication agreement signed 01/31/2018  . Chronic back pain 01/02/2018  . Pain in both feet 08/31/2017  . Knee pain, bilateral 07/05/2017  . Depression 06/07/2017  . OSA (obstructive sleep apnea) 05/12/2017  . Closed nondisplaced fracture of anterior wall of left acetabulum (HCC) 08/27/2016  . Localized osteoarthritis of right knee 08/27/2016  . Closed fracture of posterior wall of left acetabulum (HCC) 08/23/2016  . HTN (hypertension) 06/21/2016  . Allergic rhinitis 12/06/2013  . Microcytic hypochromic anemia 12/06/2013    History reviewed. No pertinent surgical history.  Prior to Admission medications   Medication Sig Start Date End  Date Taking? Authorizing Provider  amLODipine (NORVASC) 5 MG tablet Take 5 mg by mouth daily. 07/12/16   [provider]  baclofen (LIORESAL) 10 MG tablet Take 1 tablet (10 mg total) by mouth daily. 03/05/19 03/04/20  Sherrie Mustache, Roselyn Bering, PA-C  BELBUCA 75 MCG FILM APPLY 75 MCG TO CHEEK TWICE DAILY FOR CHRONIC PAIN. REPLACING PATCH 12/25/18   [provider]  Buprenorphine 15 MCG/HR PTWK APPLY 1 PATCH TOPICALLY TO SKIN ONCE A WEEK 08/21/18   [provider]  cyclobenzaprine (FLEXERIL) 5 MG tablet Take 1-2 tablets 3 times daily as needed 08/19/17   Enid Derry, PA-C  diclofenac sodium (VOLTAREN) 1 % GEL APPLY 2 TO 4 GRAMS TOPICALLY 4 TIMES DAILY 08/02/18   [provider]  gabapentin (NEURONTIN) 300 MG capsule Take 800 mg 3 (three) times daily by mouth.    [provider]  hydrOXYzine (ATARAX/VISTARIL) 25 MG tablet Can take 1-2 tablets 3 times a day as needed for anxiety 01/02/18   [provider]  ketorolac (TORADOL) 10 MG tablet Take 1 tablet (10 mg total) by mouth every 6 (six) hours as needed. 03/05/19   Fisher, Roselyn Bering, PA-C  ketorolac (TORADOL) 10 MG tablet Take 1 tablet (10 mg total) by mouth every 6 (six) hours as needed. 04/28/19   Joni Reining, PA-C  lisinopril-hydrochlorothiazide (PRINZIDE,ZESTORETIC) 20-12.5 MG tablet Take 1 tablet by mouth daily. 07/12/16   [provider]  meloxicam (MOBIC) 15 MG tablet Take 15 mg by mouth daily. 07/20/18   [provider]  methocarbamol (ROBAXIN-750) 750 MG tablet Take 1 tablet (750 mg total) by mouth 4 (four) times daily. 04/28/19   Joni Reining, PA-C  methylPREDNISolone (MEDROL DOSEPAK) 4 MG TBPK tablet 6 day dose pack - take as directed 01/16/19   Felecia Shelling, DPM  omeprazole (PRILOSEC) 20 MG capsule Take by mouth. 03/19/18 03/20/19  [provider]  ondansetron (ZOFRAN) 4 MG tablet Take by mouth. 08/25/18 08/25/19  [provider]  rOPINIRole (REQUIP) 0.5 MG tablet  Take 1 tablet (0.5 mg total) by mouth at bedtime. 05/17/17   Fisher, Roselyn Bering, PA-C  topiramate (TOPAMAX) 50 MG tablet Take 50 mg by mouth at bedtime. 08/01/18   [provider]  venlafaxine XR (EFFEXOR-XR) 37.5 MG 24 hr capsule Take 37.5 mg by mouth daily with breakfast.    [provider]  Vitamin D, Ergocalciferol, (DRISDOL) 1.25 MG (50000 UT) CAPS capsule Take 50,000 Units by mouth once a week. 08/23/18   [provider]    Allergies Amoxicillin, Oxycodone, and Duloxetine  History reviewed. No pertinent family history.  Social History Social History   Tobacco Use  . Smoking status: Never Smoker  . Smokeless tobacco: Never Used  Substance Use Topics  . Alcohol use: No  . Drug use: No    Review of Systems Constitutional: No fever/chills Eyes: No visual changes. ENT: No sore throat. Cardiovascular: Denies chest pain. Respiratory: Denies shortness of breath. Gastrointestinal: No abdominal pain.  No nausea, no vomiting.  No diarrhea.  No constipation. Genitourinary: Negative for dysuria. Musculoskeletal: Negative for back pain. Skin: Negative for rash. Neurological: Negative for headaches, focal weakness or numbness. Allergic/Immunilogical: Amoxil, oxycodone, and duloxetine ____________________________________________   PHYSICAL EXAM:  VITAL SIGNS: ED Triage Vitals  Enc Vitals Group     BP 04/28/19 1106 102/72     Pulse Rate 04/28/19 1112 (!) 54     Resp 04/28/19 1106 18     Temp 04/28/19 1106 98 F (36.7 C)     Temp Source 04/28/19 1106 Oral     SpO2 04/28/19 1106 100 %     Weight 04/28/19 1112 240 lb (108.9 kg)     Height 04/28/19 1112 5\' 6"  (1.676 m)     Head Circumference --      Peak Flow --      Pain Score 04/28/19 1112 8     Pain Loc --      Pain Edu? --      Excl. in GC? --     Constitutional: Alert and oriented. Well appearing and in no acute distress. Eyes: Conjunctivae are normal. PERRL. EOMI. Head: Atraumatic. Nose: No  congestion/rhinnorhea. Mouth/Throat: Mucous membranes are moist.  Oropharynx non-erythematous. Neck:No cervical spine tenderness to palpation. Hematological/Lymphatic/Immunilogical: No cervical lymphadenopathy. Cardiovascular: Normal rate, regular rhythm. Grossly normal heart sounds.  Good peripheral circulation. Respiratory: Normal respiratory effort.  No retractions. Lungs CTAB. Gastrointestinal: Soft and nontender. No distention. No abdominal bruits. No CVA tenderness. Musculoskeletal: No lower extremity tenderness nor edema.  No joint effusions. Neurologic:  Normal speech and language. No gross focal neurologic deficits are appreciated. No gait instability. Skin:  Skin is warm, dry and intact. No rash noted. Psychiatric: Mood and affect are normal. Speech and behavior are normal.  ____________________________________________   LABS (all labs ordered are listed, but only abnormal results are displayed)  Labs Reviewed  CBC WITH DIFFERENTIAL/PLATELET - Abnormal; Notable for the following components:      Result Value   WBC 2.9 (*)    RBC  5.30 (*)    HCT 46.9 (*)    MCHC 29.4 (*)    All other components within normal limits  URINALYSIS, COMPLETE (UACMP) WITH MICROSCOPIC - Abnormal; Notable for the following components:   Color, Urine YELLOW (*)    APPearance HAZY (*)    All other components within normal limits  SARS CORONAVIRUS 2 (TAT 6-24 HRS)  COMPREHENSIVE METABOLIC PANEL  LIPASE, BLOOD   ____________________________________________  EKG   ____________________________________________  RADIOLOGY  ED MD interpretation:    Official radiology report(s): No results found.  ____________________________________________   PROCEDURES  Procedure(s) performed (including Critical Care):  Procedures   ____________________________________________   INITIAL IMPRESSION / ASSESSMENT AND PLAN / ED COURSE  As part of my medical decision making, I reviewed the following  data within the [place]     Patient presents with 3 days of body aches.  Patient also states she is having bilateral flank and abdominal pain.  Patient denies nausea, vomiting, diarrhea.  Discussed lab results with patient.  Physical exam is consistent with viral illness.  Patient given discharge care instruction advised take medication as directed.  Patient advised self quarantine pending results of COVID-19 test.  If positive she was counseled to continue quarantine for 10 additional days.  Follow-up PCP.   Ariel Davis was evaluated in Emergency Department on 04/28/2019 for the symptoms described in the history of present illness. She was evaluated in the context of the global COVID-19 pandemic, which necessitated consideration that the patient might be at risk for infection with the SARS-CoV-2 virus that causes COVID-19. Institutional protocols and algorithms that pertain to the evaluation of patients at risk for COVID-19 are in a state of rapid change based on information released by regulatory bodies including the CDC and federal and state organizations. These policies and algorithms were followed during the patient's care in the ED.       ____________________________________________   FINAL CLINICAL IMPRESSION(S) / ED DIAGNOSES  Final diagnoses:  Viral illness     ED Discharge Orders         Ordered    ketorolac (TORADOL) 10 MG tablet  Every 6 hours PRN     04/28/19 1302    methocarbamol (ROBAXIN-750) 750 MG tablet  4 times daily     04/28/19 1302           Note:  This document was prepared using Dragon voice recognition software and may include unintentional dictation errors.    Sable Feil, PA-C 04/28/19 1305    Lilia Pro., MD 04/29/19 [phone]

## 2019-04-28 NOTE — Discharge Instructions (Addendum)
Follow discharge care instruction take medication as directed.  Advised self quarantine pending results of COVID-19 test. 

## 2019-04-28 NOTE — ED Triage Notes (Signed)
Pt states she has had body aches since 1/21, taking theraflu and has back pain and tenderness, states she feels like she has been "hit by a truck". Pt with c/o shooting pain in left lower back and tenderness in back on right side as well.

## 2019-04-29 LAB — SARS CORONAVIRUS 2 (TAT 6-24 HRS): SARS Coronavirus 2: POSITIVE — AB

## 2019-04-30 ENCOUNTER — Telehealth: Payer: Self-pay | Admitting: Nurse Practitioner

## 2019-04-30 LAB — PATHOLOGIST SMEAR REVIEW

## 2019-04-30 NOTE — Telephone Encounter (Signed)
Called to Discuss with patient about Covid symptoms and the use of bamlanivimab, a monoclonal antibody infusion for those with mild to moderate Covid symptoms and at a high risk of hospitalization.     Pt is qualified for this infusion at the [facility] infusion center due to co-morbid conditions and/or a member of an at-risk group.     Patient Active Problem List   Diagnosis Date Noted  . Class 2 obesity 06/13/2018  . Arthritis of right knee 01/31/2018  . DDD (degenerative disc disease), lumbar 01/31/2018  . Pain medication agreement signed 01/31/2018  . Chronic back pain 01/02/2018  . Pain in both feet 08/31/2017  . Knee pain, bilateral 07/05/2017  . Depression 06/07/2017  . OSA (obstructive sleep apnea) 05/12/2017  . Closed nondisplaced fracture of anterior wall of left acetabulum (HCC) 08/27/2016  . Localized osteoarthritis of right knee 08/27/2016  . Closed fracture of posterior wall of left acetabulum (HCC) 08/23/2016  . HTN (hypertension) 06/21/2016  . Allergic rhinitis 12/06/2013  . Microcytic hypochromic anemia 12/06/2013    Patient states that she is not currently having any symptoms. Symptoms tier reviewed as well as criteria for ending isolation. Preventative practices reviewed. Patient verbalized understanding.    Patient advised to call back if she does develop worsening symptoms and decides that she does want to get infusion. Callback number to the infusion center given. Patient advised to go to Urgent care or ED with severe symptoms.

## 2019-05-24 ENCOUNTER — Encounter: Payer: Self-pay | Admitting: Emergency Medicine

## 2019-05-24 ENCOUNTER — Emergency Department
Admission: EM | Admit: 2019-05-24 | Discharge: 2019-05-24 | Disposition: A | Discharge status: Home / Self Care | Payer: Medicaid Other | Attending: Emergency Medicine | Admitting: Emergency Medicine

## 2019-05-24 ENCOUNTER — Emergency Department: Payer: Medicaid Other

## 2019-05-24 DIAGNOSIS — W010XXA Fall on same level from slipping, tripping and stumbling without subsequent striking against object, initial encounter: Secondary | ICD-10-CM | POA: Insufficient documentation

## 2019-05-24 DIAGNOSIS — W19XXXA Unspecified fall, initial encounter: Secondary | ICD-10-CM

## 2019-05-24 DIAGNOSIS — Y929 Unspecified place or not applicable: Secondary | ICD-10-CM | POA: Diagnosis not present

## 2019-05-24 DIAGNOSIS — M25552 Pain in left hip: Secondary | ICD-10-CM | POA: Diagnosis not present

## 2019-05-24 DIAGNOSIS — I10 Essential (primary) hypertension: Secondary | ICD-10-CM | POA: Insufficient documentation

## 2019-05-24 DIAGNOSIS — Y939 Activity, unspecified: Secondary | ICD-10-CM | POA: Insufficient documentation

## 2019-05-24 DIAGNOSIS — Z79899 Other long term (current) drug therapy: Secondary | ICD-10-CM | POA: Insufficient documentation

## 2019-05-24 DIAGNOSIS — M25562 Pain in left knee: Secondary | ICD-10-CM | POA: Insufficient documentation

## 2019-05-24 DIAGNOSIS — Y999 Unspecified external cause status: Secondary | ICD-10-CM | POA: Diagnosis not present

## 2019-05-24 MED ORDER — KETOROLAC TROMETHAMINE 10 MG PO TABS: 10.0000 mg | ORAL_TABLET | Freq: Four times a day (QID) | ORAL | 0 refills | Status: AC | PRN

## 2019-05-24 MED ORDER — KETOROLAC TROMETHAMINE 30 MG/ML IJ SOLN
30.0000 mg | Freq: Once | INTRAMUSCULAR | Status: AC
  Administered 2019-05-24: 30 mg via INTRAMUSCULAR
  Filled 2019-05-24: qty 1

## 2019-05-24 NOTE — ED Triage Notes (Addendum)
Patient reports she slipped and fell landing on her left side. Now complaining of pain in left hip and upper leg. Denies hitting head or LOC. Able to ambulate into wheelchair but increased pain with weight bearing.

## 2019-05-24 NOTE — ED Notes (Signed)
Pt states that she fell this am and that she wasn't in any pain until she put her feet up while watching tv. Pt in tears on assessment, has left knee pain upon palpation and warmth on left knee.

## 2019-05-24 NOTE — ED Provider Notes (Signed)
Emergency Department Provider Note  ____________________________________________  Time seen: Approximately 8:23 PM  I have reviewed the triage vital signs and the nursing notes.   HISTORY  Chief Complaint Fall   Historian Patient    HPI Ariel Davis is a 54 y.o. female with a history of chronic back pain, presents to the emergency department after patient reports that she slipped and landed on her left hip.  Patient has a history of prior acetabulum fracture and became concerned for reinjury.  Patient states that she has been able to ambulate since injury occurred.  She denies hitting her head or neck.  No numbness or tingling in the upper or lower extremities.  No other alleviating measures have been attempted.   Past Medical History:  Diagnosis Date  . Anxiety   . Chronic back pain   . H/O degenerative disc disease   . Hypertension   . Sciatica      Immunizations up to date:  Yes.     Past Medical History:  Diagnosis Date  . Anxiety   . Chronic back pain   . H/O degenerative disc disease   . Hypertension   . Sciatica     Patient Active Problem List   Diagnosis Date Noted  . Class 2 obesity 06/13/2018  . Arthritis of right knee 01/31/2018  . DDD (degenerative disc disease), lumbar 01/31/2018  . Pain medication agreement signed 01/31/2018  . Chronic back pain 01/02/2018  . Pain in both feet 08/31/2017  . Knee pain, bilateral 07/05/2017  . Depression 06/07/2017  . OSA (obstructive sleep apnea) 05/12/2017  . Closed nondisplaced fracture of anterior wall of left acetabulum (HCC) 08/27/2016  . Localized osteoarthritis of right knee 08/27/2016  . Closed fracture of posterior wall of left acetabulum (HCC) 08/23/2016  . HTN (hypertension) 06/21/2016  . Allergic rhinitis 12/06/2013  . Microcytic hypochromic anemia 12/06/2013    History reviewed. No pertinent surgical history.  Prior to Admission medications   Medication Sig Start Date End Date Taking?  Authorizing Provider  amLODipine (NORVASC) 5 MG tablet Take 5 mg by mouth daily. 07/12/16   [provider]  baclofen (LIORESAL) 10 MG tablet Take 1 tablet (10 mg total) by mouth daily. 03/05/19 03/04/20  Sherrie Mustache, Roselyn Bering, PA-C  BELBUCA 75 MCG FILM APPLY 75 MCG TO CHEEK TWICE DAILY FOR CHRONIC PAIN. REPLACING PATCH 12/25/18   [provider]  Buprenorphine 15 MCG/HR PTWK APPLY 1 PATCH TOPICALLY TO SKIN ONCE A WEEK 08/21/18   [provider]  cyclobenzaprine (FLEXERIL) 5 MG tablet Take 1-2 tablets 3 times daily as needed 08/19/17   Enid Derry, PA-C  diclofenac sodium (VOLTAREN) 1 % GEL APPLY 2 TO 4 GRAMS TOPICALLY 4 TIMES DAILY 08/02/18   [provider]  gabapentin (NEURONTIN) 300 MG capsule Take 800 mg 3 (three) times daily by mouth.    [provider]  hydrOXYzine (ATARAX/VISTARIL) 25 MG tablet Can take 1-2 tablets 3 times a day as needed for anxiety 01/02/18   [provider]  ketorolac (TORADOL) 10 MG tablet Take 1 tablet (10 mg total) by mouth every 6 (six) hours as needed for up to 5 days. 05/24/19 05/29/19  Orvil Feil, PA-C  lisinopril-hydrochlorothiazide (PRINZIDE,ZESTORETIC) 20-12.5 MG tablet Take 1 tablet by mouth daily. 07/12/16   [provider]  meloxicam (MOBIC) 15 MG tablet Take 15 mg by mouth daily. 07/20/18   [provider]  methocarbamol (ROBAXIN-750) 750 MG tablet Take 1 tablet (750 mg total) by mouth  4 (four) times daily. 04/28/19   Joni Reining, PA-C  methylPREDNISolone (MEDROL DOSEPAK) 4 MG TBPK tablet 6 day dose pack - take as directed 01/16/19   Felecia Shelling, DPM  omeprazole (PRILOSEC) 20 MG capsule Take by mouth. 03/19/18 03/20/19  [provider]  ondansetron (ZOFRAN) 4 MG tablet Take by mouth. 08/25/18 08/25/19  [provider]  rOPINIRole (REQUIP) 0.5 MG tablet Take 1 tablet (0.5 mg total) by mouth at bedtime. 05/17/17   Fisher, Roselyn Bering, PA-C  topiramate (TOPAMAX) 50 MG tablet Take 50  mg by mouth at bedtime. 08/01/18   [provider]  venlafaxine XR (EFFEXOR-XR) 37.5 MG 24 hr capsule Take 37.5 mg by mouth daily with breakfast.    [provider]  Vitamin D, Ergocalciferol, (DRISDOL) 1.25 MG (50000 UT) CAPS capsule Take 50,000 Units by mouth once a week. 08/23/18   [provider]    Allergies Amoxicillin, Oxycodone, and Duloxetine  No family history on file.  Social History Social History   Tobacco Use  . Smoking status: Never Smoker  . Smokeless tobacco: Never Used  Substance Use Topics  . Alcohol use: No  . Drug use: No     Review of Systems  Constitutional: No fever/chills Eyes:  No discharge ENT: No upper respiratory complaints. Respiratory: no cough. No SOB/ use of accessory muscles to breath Gastrointestinal:   No nausea, no vomiting.  No diarrhea.  No constipation. Musculoskeletal: Patient has left hip pain.  Skin: Negative for rash, abrasions, lacerations, ecchymosis.   ____________________________________________   PHYSICAL EXAM:  VITAL SIGNS: ED Triage Vitals  Enc Vitals Group     BP 05/24/19 1819 (!) 140/102     Pulse Rate 05/24/19 1819 76     Resp 05/24/19 1819 16     Temp 05/24/19 1819 98.4 F (36.9 C)     Temp Source 05/24/19 1819 Oral     SpO2 05/24/19 1819 97 %     Weight 05/24/19 1817 240 lb (108.9 kg)     Height 05/24/19 1817 5\' 6"  (1.676 m)     Head Circumference --      Peak Flow --      Pain Score 05/24/19 1816 10     Pain Loc --      Pain Edu? --      Excl. in GC? --      Constitutional: Alert and oriented. Well appearing and in no acute distress. Eyes: Conjunctivae are normal. PERRL. EOMI. Head: Atraumatic. Cardiovascular: Normal rate, regular rhythm. Normal S1 and S2.  Good peripheral circulation. Respiratory: Normal respiratory effort without tachypnea or retractions. Lungs CTAB. Good air entry to the bases with no decreased or absent breath sounds Gastrointestinal: Bowel sounds x 4  quadrants. Soft and nontender to palpation. No guarding or rigidity. No distention. Musculoskeletal: 5 out of 5 strength in the lower extremities bilaterally.  Patient has no pain with internal and external rotation at the left hip.  Full range of motion of the left knee. Neurologic:  Normal for age. No gross focal neurologic deficits are appreciated.  Skin:  Skin is warm, dry and intact. No rash noted. Psychiatric: Mood and affect are normal for age. Speech and behavior are normal.   ____________________________________________   LABS (all labs ordered are listed, but only abnormal results are displayed)  Labs Reviewed - No data to display ____________________________________________  EKG   ____________________________________________  RADIOLOGY 05/26/19, personally viewed and evaluated these images (plain radiographs) as  part of my medical decision making, as well as reviewing the written report by the radiologist.  DG Hip Unilat W or Wo Pelvis 2-3 Views Left  Result Date: 05/24/2019 CLINICAL DATA:  Pain after trauma EXAM: DG HIP (WITH OR WITHOUT PELVIS) 2-3V LEFT COMPARISON:  None. FINDINGS: There is no evidence of hip fracture or dislocation. There is no evidence of arthropathy or other focal bone abnormality. IMPRESSION: Negative. Electronically Signed   By: Dorise Bullion III M.D   On: 05/24/2019 19:02   DG Femur 1 View Left  Result Date: 05/24/2019 CLINICAL DATA:  Fall.  Pain. EXAM: LEFT FEMUR 1 VIEW COMPARISON:  None. FINDINGS: Degenerative changes in the knee. No fractures in the femur identified. No dislocation. IMPRESSION: No femur fracture. Electronically Signed   By: Dorise Bullion III M.D   On: 05/24/2019 19:01    ____________________________________________    PROCEDURES  Procedure(s) performed:     Procedures     Medications  ketorolac (TORADOL) 30 MG/ML injection 30 mg (30 mg Intramuscular Given 05/24/19 1930)      ____________________________________________   INITIAL IMPRESSION / ASSESSMENT AND PLAN / ED COURSE  Pertinent labs & imaging results that were available during my care of the patient were reviewed by me and considered in my medical decision making (see chart for details).      Assessment and plan Fall 53 year old female presents to the emergency department after mechanical, nonsyncopal fall  Patient was mildly hypertensive at triage but vital signs were otherwise reassuring.  She had no pain with internal and external rotation of the left hip.  X-ray examination of the left hip and left femur revealed no bony abnormality.  Patient was given an injection of Toradol in the emergency department.  She was discharged with Toradol.  Review of the [place] drug database was undertaken during this emergency department encounter.  She was advised to follow-up with orthopedics as needed.  All patient questions were answered.  ____________________________________________  FINAL CLINICAL IMPRESSION(S) / ED DIAGNOSES  Final diagnoses:  Fall, initial encounter      NEW MEDICATIONS STARTED DURING THIS VISIT:  ED Discharge Orders         Ordered    ketorolac (TORADOL) 10 MG tablet  Every 6 hours PRN     05/24/19 1957              This chart was dictated using voice recognition software/Dragon. Despite best efforts to proofread, errors can occur which can change the meaning. Any change was purely unintentional.     Lannie Fields, PA-C 05/24/19 2027    Earleen Newport, MD 05/24/19 2237

## 2019-12-16 ENCOUNTER — Emergency Department
Admission: EM | Admit: 2019-12-16 | Discharge: 2019-12-16 | Disposition: A | Discharge status: Home / Self Care | Payer: Medicaid Other | Attending: Emergency Medicine | Admitting: Emergency Medicine

## 2019-12-16 ENCOUNTER — Emergency Department: Payer: Medicaid Other

## 2019-12-16 ENCOUNTER — Other Ambulatory Visit: Payer: Self-pay

## 2019-12-16 DIAGNOSIS — Z79899 Other long term (current) drug therapy: Secondary | ICD-10-CM | POA: Diagnosis not present

## 2019-12-16 DIAGNOSIS — G8929 Other chronic pain: Secondary | ICD-10-CM

## 2019-12-16 DIAGNOSIS — I10 Essential (primary) hypertension: Secondary | ICD-10-CM | POA: Diagnosis not present

## 2019-12-16 DIAGNOSIS — M5442 Lumbago with sciatica, left side: Secondary | ICD-10-CM | POA: Diagnosis not present

## 2019-12-16 DIAGNOSIS — M545 Low back pain: Secondary | ICD-10-CM | POA: Diagnosis present

## 2019-12-16 MED ORDER — BACLOFEN 10 MG PO TABS: 10.0000 mg | ORAL_TABLET | Freq: Two times a day (BID) | ORAL | 0 refills | Status: AC

## 2019-12-16 MED ORDER — KETOROLAC TROMETHAMINE 60 MG/2ML IM SOLN
60.0000 mg | Freq: Once | INTRAMUSCULAR | Status: AC
  Administered 2019-12-16: 60 mg via INTRAMUSCULAR
  Filled 2019-12-16: qty 2

## 2019-12-16 NOTE — ED Provider Notes (Signed)
[facility] Emergency Department Provider Note  ____________________________________________   First MD Initiated Contact with Patient 12/16/19 2032     (approximate)  I have reviewed the triage vital signs and the nursing notes.   HISTORY  Chief Complaint Back Pain   HPI Ariel Davis is a 54 y.o. female who reports to emergency department for acute on chronic low back pain with left-sided sciatica.  The patient states that she has been dealing with low back pain with right-sided sciatica for many years and she sees pain management at [facility] for management of this.  However, recently in the last few days she had new onset of left leg radiation.  She denies fever, loss of bowel or bladder or saddle anesthesia.  The patient has not had any noticeable loss of motor function and has not had any tremors, weakness or falls.  She has been using the Belbuca as prescribed by her pain management however she does not have any more baclofen that she was previously prescribed.  She states whenever she has had flares like this in the past, Toradol has been very successful in taking care of her pain and she is requesting this again today.         Past Medical History:  Diagnosis Date  . Anxiety   . Chronic back pain   . H/O degenerative disc disease   . Hypertension   . Sciatica     Patient Active Problem List   Diagnosis Date Noted  . Class 2 obesity 06/13/2018  . Arthritis of right knee 01/31/2018  . DDD (degenerative disc disease), lumbar 01/31/2018  . Pain medication agreement signed 01/31/2018  . Chronic back pain 01/02/2018  . Pain in both feet 08/31/2017  . Knee pain, bilateral 07/05/2017  . Depression 06/07/2017  . OSA (obstructive sleep apnea) 05/12/2017  . Closed nondisplaced fracture of anterior wall of left acetabulum (HCC) 08/27/2016  . Localized osteoarthritis of right knee 08/27/2016  . Closed fracture of posterior wall of left acetabulum (HCC)  08/23/2016  . HTN (hypertension) 06/21/2016  . Allergic rhinitis 12/06/2013  . Microcytic hypochromic anemia 12/06/2013    History reviewed. No pertinent surgical history.  Prior to Admission medications   Medication Sig Start Date End Date Taking? Authorizing Provider  amLODipine (NORVASC) 5 MG tablet Take 5 mg by mouth daily. 07/12/16   [provider]  baclofen (LIORESAL) 10 MG tablet Take 1 tablet (10 mg total) by mouth 2 (two) times daily for 15 days. 12/16/19 12/31/19  Sola Margolis, Ruben Gottron, PA  BELBUCA 75 MCG FILM APPLY 75 MCG TO CHEEK TWICE DAILY FOR CHRONIC PAIN. REPLACING PATCH 12/25/18   [provider]  Buprenorphine 15 MCG/HR PTWK APPLY 1 PATCH TOPICALLY TO SKIN ONCE A WEEK 08/21/18   [provider]  diclofenac sodium (VOLTAREN) 1 % GEL APPLY 2 TO 4 GRAMS TOPICALLY 4 TIMES DAILY 08/02/18   [provider]  gabapentin (NEURONTIN) 300 MG capsule Take 800 mg 3 (three) times daily by mouth.    [provider]  hydrOXYzine (ATARAX/VISTARIL) 25 MG tablet Can take 1-2 tablets 3 times a day as needed for anxiety 01/02/18   [provider]  lisinopril-hydrochlorothiazide (PRINZIDE,ZESTORETIC) 20-12.5 MG tablet Take 1 tablet by mouth daily. 07/12/16   [provider]  meloxicam (MOBIC) 15 MG tablet Take 15 mg by mouth daily. 07/20/18   [provider]  methylPREDNISolone (MEDROL DOSEPAK) 4 MG TBPK tablet 6 day dose pack - take as directed  01/16/19   Felecia Shelling, DPM  omeprazole (PRILOSEC) 20 MG capsule Take by mouth. 03/19/18 03/20/19  [provider]  rOPINIRole (REQUIP) 0.5 MG tablet Take 1 tablet (0.5 mg total) by mouth at bedtime. 05/17/17   Fisher, Roselyn Bering, PA-C  topiramate (TOPAMAX) 50 MG tablet Take 50 mg by mouth at bedtime. 08/01/18   [provider]  venlafaxine XR (EFFEXOR-XR) 37.5 MG 24 hr capsule Take 37.5 mg by mouth daily with breakfast.    [provider]  Vitamin D, Ergocalciferol,  (DRISDOL) 1.25 MG (50000 UT) CAPS capsule Take 50,000 Units by mouth once a week. 08/23/18   [provider]    Allergies Amoxicillin, Oxycodone, and Duloxetine  No family history on file.  Social History Social History   Tobacco Use  . Smoking status: Never Smoker  . Smokeless tobacco: Never Used  Vaping Use  . Vaping Use: Never used  Substance Use Topics  . Alcohol use: No  . Drug use: No    Review of Systems Constitutional: No fever/chills Eyes: No visual changes. ENT: No sore throat. Cardiovascular: Denies chest pain. Respiratory: Denies shortness of breath. Gastrointestinal: No abdominal pain.  No nausea, no vomiting.  No diarrhea.  No constipation. Genitourinary: Negative for dysuria. Musculoskeletal: + Back pain,+ leg pain Skin: Negative for rash. Neurological: Negative for headaches, focal weakness or numbness.   ____________________________________________   PHYSICAL EXAM:  VITAL SIGNS: ED Triage Vitals  Enc Vitals Group     BP 12/16/19 2001 133/89     Pulse Rate 12/16/19 2001 74     Resp 12/16/19 2001 18     Temp 12/16/19 2001 98 F (36.7 C)     Temp Source 12/16/19 2217 Oral     SpO2 12/16/19 2001 99 %     Weight 12/16/19 2002 240 lb (108.9 kg)     Height 12/16/19 2002 5\' 6"  (1.676 m)     Head Circumference --      Peak Flow --      Pain Score 12/16/19 2002 10     Pain Loc --      Pain Edu? --      Excl. in GC? --     Constitutional: Alert and oriented. Well appearing and in no acute distress. Eyes: Conjunctivae are normal. PERRL. EOMI. Head: Atraumatic. Neck: No stridor.   Cardiovascular: Normal rate, regular rhythm. Grossly normal heart sounds.  Good peripheral circulation. Respiratory: Normal respiratory effort.  No retractions. Lungs CTAB. Gastrointestinal: Soft and nontender. No distention. No abdominal bruits. No CVA tenderness. Musculoskeletal: There is tenderness to the lumbar spine, more significant in the left paraspinal  region.  She is point tender over the left SI joint as well as piriformis region.  She has 5/5 strength in hip flexion, knee flexion, knee extension, ankle dorsiflexion, ankle plantarflexion bilaterally.  She has normal DTRs of the patellar tendons and Achilles tendons. Neurologic:  Normal speech and language. No gross focal neurologic deficits are appreciated. No gait instability. Skin:  Skin is warm, dry and intact. No rash noted. Psychiatric: Mood and affect are normal. Speech and behavior are normal.  ____________________________________________  RADIOLOGY  Official radiology report(s): DG Lumbar Spine 2-3 Views  Result Date: 12/16/2019 CLINICAL DATA:  Lower back pain. EXAM: LUMBAR SPINE - 2-3 VIEW COMPARISON:  February 18, 2017 FINDINGS: There is no evidence of lumbar spine fracture. 1 mm to 2 mm anterolisthesis of the L3 vertebral body is seen on L4. Approximately 2 mm anterolisthesis of the  L4 vertebral body is seen on L5. This is seen on the prior study and is less pronounced on the current exam. Intervertebral disc spaces are maintained. IMPRESSION: 1. Minimal anterolisthesis of the L3 vertebral body and L4 vertebral body without an acute osseous abnormality. Electronically Signed   By: Aram Candela M.D.   On: 12/16/2019 22:03     ____________________________________________   INITIAL IMPRESSION / ASSESSMENT AND PLAN / ED COURSE  As part of my medical decision making, I reviewed the following data within the electronic MEDICAL RECORD NUMBER Nursing notes reviewed and incorporated, Notes from prior ED visits and [place] Controlled Substance Database        Ikram Riebe is a 54 year old female who reports to the emergency department for low back pain with sciatica that is new to the left side.  The patient has longstanding history of low back pain with right-sided sciatica and she states this feels similar except on the left side.  She has had no new traumas or falls to exacerbate this.   Physical exam is reassuring in that she has 5/5 strength in all major muscle groups of the bilateral lower extremities and normal DTRs.  X-rays reveal chronic changes with no acute bony abnormalities.  Given that Toradol has been successful for the patient in the past, feel that is reasonable to treat with that today.  I will also prescribe her baclofen as well.  She should follow-up with her pain management team at [facility] should she need any further care for her back or return to the emergency department if it becomes associated with fever, loss of bowel or bladder or saddle anesthesia.  Patient acknowledges this and is amenable with outpatient therapy.      ____________________________________________   FINAL CLINICAL IMPRESSION(S) / ED DIAGNOSES  Final diagnoses:  Chronic left-sided low back pain with left-sided sciatica     ED Discharge Orders         Ordered    baclofen (LIORESAL) 10 MG tablet  2 times daily        12/16/19 2154          *Please note:  TORAH PINNOCK was evaluated in Emergency Department on 12/16/2019 for the symptoms described in the history of present illness. She was evaluated in the context of the global COVID-19 pandemic, which necessitated consideration that the patient might be at risk for infection with the SARS-CoV-2 virus that causes COVID-19. Institutional protocols and algorithms that pertain to the evaluation of patients at risk for COVID-19 are in a state of rapid change based on information released by regulatory bodies including the CDC and federal and state organizations. These policies and algorithms were followed during the patient's care in the ED.  Some ED evaluations and interventions may be delayed as a result of limited staffing during and the pandemic.*   Note:  This document was prepared using Dragon voice recognition software and may include unintentional dictation errors.    Lucy Chris, PA 12/16/19 2303    Sharman Cheek,  MD 12/16/19 [phone]

## 2019-12-16 NOTE — ED Notes (Signed)
Pt back from xray at this time.

## 2019-12-16 NOTE — ED Triage Notes (Signed)
Patient c/o lower back/buttock pain radiating down leg. Patient reports pmh of siatica, reports this feels the same.

## 2019-12-17 ENCOUNTER — Telehealth: Payer: Self-pay | Admitting: Student

## 2019-12-17 MED ORDER — KETOROLAC TROMETHAMINE 10 MG PO TABS: 10.0000 mg | ORAL_TABLET | Freq: Four times a day (QID) | ORAL | 0 refills | Status: DC | PRN

## 2019-12-17 NOTE — Telephone Encounter (Cosign Needed)
Patient seen last night, needed antiinflammatory sent to pharmacy.

## 2020-05-08 ENCOUNTER — Other Ambulatory Visit: Payer: Self-pay

## 2020-05-08 ENCOUNTER — Encounter: Payer: Self-pay | Admitting: Emergency Medicine

## 2020-05-08 ENCOUNTER — Emergency Department
Admission: EM | Admit: 2020-05-08 | Discharge: 2020-05-08 | Disposition: A | Discharge status: Home / Self Care | Payer: Medicare Other | Attending: Emergency Medicine | Admitting: Emergency Medicine

## 2020-05-08 DIAGNOSIS — M25552 Pain in left hip: Secondary | ICD-10-CM | POA: Diagnosis present

## 2020-05-08 DIAGNOSIS — Z79899 Other long term (current) drug therapy: Secondary | ICD-10-CM | POA: Insufficient documentation

## 2020-05-08 DIAGNOSIS — I10 Essential (primary) hypertension: Secondary | ICD-10-CM | POA: Insufficient documentation

## 2020-05-08 MED ORDER — KETOROLAC TROMETHAMINE 10 MG PO TABS: 10.0000 mg | ORAL_TABLET | Freq: Four times a day (QID) | ORAL | 0 refills | Status: AC | PRN

## 2020-05-08 MED ORDER — KETOROLAC TROMETHAMINE 60 MG/2ML IM SOLN
30.0000 mg | Freq: Once | INTRAMUSCULAR | Status: AC
  Administered 2020-05-08: 30 mg via INTRAMUSCULAR
  Filled 2020-05-08: qty 2

## 2020-05-08 NOTE — ED Provider Notes (Signed)
[facility] Emergency Department Provider Note  ____________________________________________   Event Date/Time   First MD Initiated Contact with Patient 05/08/20 1812     (approximate)  I have reviewed the triage vital signs and the nursing notes.   HISTORY  Chief Complaint Hip Pain  HPI Ariel Davis is a 55 y.o. female who presents to the emergency department for evaluation of left buttock and hip pain.  Patient states that she has had this ongoing for the last 2 years intermittently after a fall.  She denies any new falls.  States that it recently worsened over the last few days.  She states that she tried ibuprofen and muscle relaxant at home without benefit.  She reports that it flares up bad enough every so often that she has to come here for a "shot".  She is already established with pain management at [facility] but has not seen them recently.  She denies any loss of bowel or bladder function, denies fevers, denies saddle anesthesia, denies extremity weakness.       Past Medical History:  Diagnosis Date  . Anxiety   . Chronic back pain   . H/O degenerative disc disease   . Hypertension   . Sciatica     Patient Active Problem List   Diagnosis Date Noted  . Class 2 obesity 06/13/2018  . Arthritis of right knee 01/31/2018  . DDD (degenerative disc disease), lumbar 01/31/2018  . Pain medication agreement signed 01/31/2018  . Chronic back pain 01/02/2018  . Pain in both feet 08/31/2017  . Knee pain, bilateral 07/05/2017  . Depression 06/07/2017  . OSA (obstructive sleep apnea) 05/12/2017  . Closed nondisplaced fracture of anterior wall of left acetabulum (HCC) 08/27/2016  . Localized osteoarthritis of right knee 08/27/2016  . Closed fracture of posterior wall of left acetabulum (HCC) 08/23/2016  . HTN (hypertension) 06/21/2016  . Allergic rhinitis 12/06/2013  . Microcytic hypochromic anemia 12/06/2013    History reviewed. No pertinent surgical  history.  Prior to Admission medications   Medication Sig Start Date End Date Taking? Authorizing Provider  ketorolac (TORADOL) 10 MG tablet Take 1 tablet (10 mg total) by mouth every 6 (six) hours as needed for up to 5 days. 05/08/20 05/13/20 Yes Kellina Dreese, Ruben Gottron, PA  amLODipine (NORVASC) 5 MG tablet Take 5 mg by mouth daily. 07/12/16   [provider]  BELBUCA 75 MCG FILM APPLY 75 MCG TO CHEEK TWICE DAILY FOR CHRONIC PAIN. REPLACING PATCH 12/25/18   [provider]  Buprenorphine 15 MCG/HR PTWK APPLY 1 PATCH TOPICALLY TO SKIN ONCE A WEEK 08/21/18   [provider]  diclofenac sodium (VOLTAREN) 1 % GEL APPLY 2 TO 4 GRAMS TOPICALLY 4 TIMES DAILY 08/02/18   [provider]  gabapentin (NEURONTIN) 300 MG capsule Take 800 mg 3 (three) times daily by mouth.    [provider]  hydrOXYzine (ATARAX/VISTARIL) 25 MG tablet Can take 1-2 tablets 3 times a day as needed for anxiety 01/02/18   [provider]  lisinopril-hydrochlorothiazide (PRINZIDE,ZESTORETIC) 20-12.5 MG tablet Take 1 tablet by mouth daily. 07/12/16   [provider]  meloxicam (MOBIC) 15 MG tablet Take 15 mg by mouth daily. 07/20/18   [provider]  methylPREDNISolone (MEDROL DOSEPAK) 4 MG TBPK tablet 6 day dose pack - take as directed 01/16/19   Felecia Shelling, DPM  omeprazole (PRILOSEC) 20 MG capsule Take by mouth. 03/19/18 03/20/19  [provider]  rOPINIRole (REQUIP) 0.5 MG tablet  Take 1 tablet (0.5 mg total) by mouth at bedtime. 05/17/17   Fisher, Roselyn Bering, PA-C  topiramate (TOPAMAX) 50 MG tablet Take 50 mg by mouth at bedtime. 08/01/18   [provider]  venlafaxine XR (EFFEXOR-XR) 37.5 MG 24 hr capsule Take 37.5 mg by mouth daily with breakfast.    [provider]  Vitamin D, Ergocalciferol, (DRISDOL) 1.25 MG (50000 UT) CAPS capsule Take 50,000 Units by mouth once a week. 08/23/18   [provider]    Allergies Amoxicillin, Oxycodone,  and Duloxetine  History reviewed. No pertinent family history.  Social History Social History   Tobacco Use  . Smoking status: Never Smoker  . Smokeless tobacco: Never Used  Vaping Use  . Vaping Use: Never used  Substance Use Topics  . Alcohol use: No  . Drug use: No    Review of Systems Constitutional: No fever/chills Eyes: No visual changes. ENT: No sore throat. Cardiovascular: Denies chest pain. Respiratory: Denies shortness of breath. Gastrointestinal: No abdominal pain.  No nausea, no vomiting.  No diarrhea.  No constipation. Genitourinary: Negative for dysuria. Musculoskeletal: + Left buttock and hip pain, negative for back pain. Skin: Negative for rash. Neurological: Negative for headaches, focal weakness or numbness.  ____________________________________________   PHYSICAL EXAM:  VITAL SIGNS: ED Triage Vitals  Enc Vitals Group     BP 05/08/20 1715 130/79     Pulse Rate 05/08/20 1715 81     Resp 05/08/20 1715 18     Temp 05/08/20 1715 98.2 F (36.8 C)     Temp Source 05/08/20 1715 Oral     SpO2 05/08/20 1850 95 %     Weight 05/08/20 1716 240 lb (108.9 kg)     Height 05/08/20 1716 5\' 7"  (1.702 m)     Head Circumference --      Peak Flow --      Pain Score 05/08/20 1716 9     Pain Loc --      Pain Edu? --      Excl. in GC? --     Constitutional: Alert and oriented. Well appearing and in no acute distress. Eyes: Conjunctivae are normal. PERRL. EOMI. Head: Atraumatic. Nose: No congestion/rhinnorhea. Mouth/Throat: Mucous membranes are moist. Neck: No stridor.   Cardiovascular: Normal rate, regular rhythm. Grossly normal heart sounds.  Good peripheral circulation. Respiratory: Normal respiratory effort.  No retractions. Lungs CTAB. Musculoskeletal: There is no tenderness to palpation of the midline of the thoracic or lumbar spine.  No tenderness to palpation of the paraspinal musculature of the lumbar spine.  There is mild tenderness at the left SI  joint, greater tenderness in the left gluteal region and into the greater trochanter region of the left hip.  Normal gait.  Dorsal pedal pulse 2+.  5/5 strength in bilateral ankle plantarflexion, dorsiflexion, knee flexion and extension. Neurologic:  Normal speech and language. No gross focal neurologic deficits are appreciated. No gait instability. Skin:  Skin is warm, dry and intact. No rash noted. Psychiatric: Mood and affect are normal. Speech and behavior are normal.   ____________________________________________   INITIAL IMPRESSION / ASSESSMENT AND PLAN / ED COURSE  As part of my medical decision making, I reviewed the following data within the electronic MEDICAL RECORD NUMBER Nursing notes reviewed and incorporated and Notes from prior ED visits        Patient is a 55 year old female who presents to the emergency department for evaluation of left buttock and hip pain.  See HPI for  further details.  Patient denies any new falls, denies red flag symptoms.  Physical exam does reveal tenderness of the left gluteals as well as into the greater trochanter.  She does not have any concerning neurovascular findings.  Given the fact that this is a recurring chronic condition for her and she has not had any new falls or injuries, will defer imaging at this time.  Review of the patient's chart reveals that she was here in September 2021 and received Toradol shot.  Patient would like to receive this again and I feel this is amenable given the amount of time labs.  This was provided for the patient.  Patient was advised to return to the emergency department if she experiences any acute worsening.  She is amenable with this plan and was instructed to follow-up with her pain management provider.      ____________________________________________   FINAL CLINICAL IMPRESSION(S) / ED DIAGNOSES  Final diagnoses:  Left hip pain     ED Discharge Orders         Ordered    ketorolac (TORADOL) 10 MG tablet   Every 6 hours PRN        05/08/20 1815          *Please note:  Ariel Davis was evaluated in Emergency Department on 05/08/2020 for the symptoms described in the history of present illness. She was evaluated in the context of the global COVID-19 pandemic, which necessitated consideration that the patient might be at risk for infection with the SARS-CoV-2 virus that causes COVID-19. Institutional protocols and algorithms that pertain to the evaluation of patients at risk for COVID-19 are in a state of rapid change based on information released by regulatory bodies including the CDC and federal and state organizations. These policies and algorithms were followed during the patient's care in the ED.  Some ED evaluations and interventions may be delayed as a result of limited staffing during and the pandemic.*   Note:  This document was prepared using Dragon voice recognition software and may include unintentional dictation errors.   Lucy Chris, PA 05/09/20 1538    Concha Se, MD 05/10/20 [phone]

## 2020-05-08 NOTE — ED Triage Notes (Signed)
Pt comes into the ED via POV c/o left hip pain.  Pt states she gets inflammation in this hip sometimes from an old injury.  Pt took her medication that is prescribed to help, but found no relief.  Pt states the pain has been ongoing x 2 days.  Pt in NAD at this time with even and unlabored respirations.

## 2020-05-08 NOTE — ED Notes (Signed)
Pt reports two years ago she fractured her hip and femur, reports recurrent pain since then. Pt reports she was not eligible for surgical fix due to age. Pt reports increased pain to left hip, beginning in left lumbar. Reports bilateral sciatica.   Pt reports every few months she gets a shot that helps her, and recently received a prescription of a pill to help her pain as well. Pt states she already takes gabapentin.   Pt a/o x4

## 2020-10-15 ENCOUNTER — Emergency Department: Payer: Medicare Other

## 2020-10-15 ENCOUNTER — Other Ambulatory Visit: Payer: Self-pay

## 2020-10-15 ENCOUNTER — Emergency Department
Admission: EM | Admit: 2020-10-15 | Discharge: 2020-10-16 | Disposition: A | Discharge status: Home / Self Care | Payer: Medicare Other | Attending: Emergency Medicine | Admitting: Emergency Medicine

## 2020-10-15 DIAGNOSIS — R112 Nausea with vomiting, unspecified: Secondary | ICD-10-CM | POA: Insufficient documentation

## 2020-10-15 DIAGNOSIS — Z79899 Other long term (current) drug therapy: Secondary | ICD-10-CM | POA: Diagnosis not present

## 2020-10-15 DIAGNOSIS — I10 Essential (primary) hypertension: Secondary | ICD-10-CM | POA: Diagnosis not present

## 2020-10-15 DIAGNOSIS — R109 Unspecified abdominal pain: Secondary | ICD-10-CM | POA: Insufficient documentation

## 2020-10-15 DIAGNOSIS — Z760 Encounter for issue of repeat prescription: Secondary | ICD-10-CM | POA: Diagnosis not present

## 2020-10-15 LAB — COMPREHENSIVE METABOLIC PANEL
ALT: 29 U/L (ref 0–44)
AST: 31 U/L (ref 15–41)
Albumin: 3.7 g/dL (ref 3.5–5.0)
Alkaline Phosphatase: 31 U/L — ABNORMAL LOW (ref 38–126)
Anion gap: 7 (ref 5–15)
BUN: 13 mg/dL (ref 6–20)
CO2: 23 mmol/L (ref 22–32)
Calcium: 9.2 mg/dL (ref 8.9–10.3)
Chloride: 113 mmol/L — ABNORMAL HIGH (ref 98–111)
Creatinine, Ser: 0.82 mg/dL (ref 0.44–1.00)
GFR, Estimated: >60 mL/min (ref >60)
Glucose, Bld: 93 mg/dL (ref 70–99)
Potassium: 3.4 mmol/L — ABNORMAL LOW (ref 3.5–5.1)
Sodium: 143 mmol/L (ref 135–145)
Total Bilirubin: 1 mg/dL (ref 0.3–1.2)
Total Protein: 6.2 g/dL — ABNORMAL LOW (ref 6.5–8.1)

## 2020-10-15 LAB — CBC
HCT: 45.7 % (ref 36.0–46.0)
Hemoglobin: 14.1 g/dL (ref 12.0–15.0)
MCH: 26.9 pg (ref 26.0–34.0)
MCHC: 30.9 g/dL (ref 30.0–36.0)
MCV: 87.2 fL (ref 80.0–100.0)
Platelets: 215 10*3/uL (ref 150–400)
RBC: 5.24 MIL/uL — ABNORMAL HIGH (ref 3.87–5.11)
RDW: 14.2 % (ref 11.5–15.5)
WBC: 4.6 10*3/uL (ref 4.0–10.5)
nRBC: 0 % (ref 0.0–0.2)

## 2020-10-15 LAB — LIPASE, BLOOD: Lipase: 30 U/L (ref 11–51)

## 2020-10-15 MED ORDER — LACTATED RINGERS IV BOLUS
1000.0000 mL | Freq: Once | INTRAVENOUS | Status: AC
  Administered 2020-10-15: 1000 mL via INTRAVENOUS

## 2020-10-15 MED ORDER — ONDANSETRON HCL 4 MG/2ML IJ SOLN
4.0000 mg | Freq: Once | INTRAMUSCULAR | Status: AC
  Administered 2020-10-15: 4 mg via INTRAVENOUS
  Filled 2020-10-15: qty 2

## 2020-10-15 NOTE — ED Triage Notes (Addendum)
Pt comes with c/o N/V and belly pain that started yesterday. Pt states she just doesn't have any energy.  Pt states she feels constipated and normally takes meds for this issue. Pt states she ran out and doesn't have any refills.

## 2020-10-15 NOTE — ED Notes (Signed)
Patient transported to X-ray 

## 2020-10-15 NOTE — ED Notes (Signed)
ED Provider at bedside. 

## 2020-10-16 ENCOUNTER — Emergency Department: Payer: Medicare Other

## 2020-10-16 DIAGNOSIS — R112 Nausea with vomiting, unspecified: Secondary | ICD-10-CM | POA: Diagnosis not present

## 2020-10-16 LAB — URINALYSIS, COMPLETE (UACMP) WITH MICROSCOPIC
Bacteria, UA: NONE SEEN
Bilirubin Urine: NEGATIVE
Glucose, UA: NEGATIVE mg/dL
Hgb urine dipstick: NEGATIVE
Ketones, ur: NEGATIVE mg/dL
Leukocytes,Ua: NEGATIVE
Nitrite: NEGATIVE
Protein, ur: NEGATIVE mg/dL
Specific Gravity, Urine: 1.015 (ref 1.005–1.030)
Squamous Epithelial / HPF: NONE SEEN (ref 0–5)
pH: 7 (ref 5.0–8.0)

## 2020-10-16 LAB — TROPONIN I (HIGH SENSITIVITY)
Troponin I (High Sensitivity): 4 ng/L (ref <18)
Troponin I (High Sensitivity): 5 ng/L (ref <18)

## 2020-10-16 MED ORDER — VENLAFAXINE HCL ER 37.5 MG PO CP24: 37.5000 mg | ORAL_CAPSULE | Freq: Every day | ORAL | 0 refills | Status: AC

## 2020-10-16 MED ORDER — ONDANSETRON HCL 4 MG/2ML IJ SOLN
4.0000 mg | Freq: Once | INTRAMUSCULAR | Status: AC
  Administered 2020-10-16: 4 mg via INTRAVENOUS
  Filled 2020-10-16: qty 2

## 2020-10-16 MED ORDER — ONDANSETRON 4 MG PO TBDP: 4.0000 mg | ORAL_TABLET | Freq: Three times a day (TID) | ORAL | 0 refills | Status: DC | PRN

## 2020-10-16 MED ORDER — VENLAFAXINE HCL ER 37.5 MG PO CP24
37.5000 mg | ORAL_CAPSULE | Freq: Once | ORAL | Status: AC
  Administered 2020-10-16: 37.5 mg via ORAL
  Filled 2020-10-16: qty 1

## 2020-10-16 MED ORDER — ASPIRIN 81 MG PO CHEW
324.0000 mg | CHEWABLE_TABLET | Freq: Once | ORAL | Status: AC
  Administered 2020-10-16: 324 mg via ORAL
  Filled 2020-10-16: qty 4

## 2020-10-16 NOTE — ED Notes (Signed)
Pt given ginger ale and saltine crackers.  

## 2020-10-16 NOTE — ED Notes (Signed)
ED Provider at bedside. 

## 2020-10-16 NOTE — Discharge Instructions (Addendum)
Make sure to restart her venlafaxine first thing in the morning to prevent withdrawal symptoms.  Follow-up with your primary care doctor.  Return to the emergency room for chest pain, abdominal pain or fever

## 2020-10-16 NOTE — ED Notes (Signed)
xr at bedside

## 2020-10-16 NOTE — ED Provider Notes (Signed)
[facility] Emergency Department Provider Note  ____________________________________________  Time seen: Approximately 1:44 AM  I have reviewed the triage vital signs and the nursing notes.   HISTORY  Chief Complaint Nausea and Emesis   HPI Ariel Davis is a 55 y.o. female with a history obesity, chronic back pain, depression, OSA, hypertension, anxiety who presents for evaluation of nausea and vomiting.  Patient reports that she ran out of her venlafaxine 2 days ago.  Try to get a refill but she did not have any more left. she reports that she takes this medication for depression but this medication also helps her have regular bowel movements.  Over the last 2 days she was having difficulty going to the bathroom.  Woke up this morning with cramping abdominal pain.  She took some prune juice and was able to have a bowel movement.  After that her abdominal pain resolved but she started having nausea and vomiting.  She reports vomiting several times today, nonbloody nonbilious.  She denies any abdominal pain.  She does report feeling a sensation of a tightening bra around her chest that does not resolve after removing her bra.  She reports that that is when she feels nauseous and vomits.  No fever or chills.  No cardiac history.  No personal or family history of PE or DVT, no recent travel or immobilization, no leg pain or swelling, no exogenous hormones.  Past Medical History:  Diagnosis Date   Anxiety    Chronic back pain    H/O degenerative disc disease    Hypertension    Sciatica     Patient Active Problem List   Diagnosis Date Noted   Class 2 obesity 06/13/2018   Arthritis of right knee 01/31/2018   DDD (degenerative disc disease), lumbar 01/31/2018   Pain medication agreement signed 01/31/2018   Chronic back pain 01/02/2018   Pain in both feet 08/31/2017   Knee pain, bilateral 07/05/2017   Depression 06/07/2017   OSA (obstructive sleep apnea)  05/12/2017   Closed nondisplaced fracture of anterior wall of left acetabulum (HCC) 08/27/2016   Localized osteoarthritis of right knee 08/27/2016   Closed fracture of posterior wall of left acetabulum (HCC) 08/23/2016   HTN (hypertension) 06/21/2016   Allergic rhinitis 12/06/2013   Microcytic hypochromic anemia 12/06/2013    History reviewed. No pertinent surgical history.  Prior to Admission medications   Medication Sig Start Date End Date Taking? Authorizing Provider  ondansetron (ZOFRAN ODT) 4 MG disintegrating tablet Take 1 tablet (4 mg total) by mouth every 8 (eight) hours as needed. 10/16/20  Yes Don Perking, [place], MD  venlafaxine XR (EFFEXOR XR) 37.5 MG 24 hr capsule Take 1 capsule (37.5 mg total) by mouth daily. 10/16/20 10/16/21 Yes Arkeem Harts, [place], MD  amLODipine (NORVASC) 5 MG tablet Take 5 mg by mouth daily. 07/12/16   [provider]  BELBUCA 75 MCG FILM APPLY 75 MCG TO CHEEK TWICE DAILY FOR CHRONIC PAIN. REPLACING PATCH 12/25/18   [provider]  Buprenorphine 15 MCG/HR PTWK APPLY 1 PATCH TOPICALLY TO SKIN ONCE A WEEK 08/21/18   [provider]  diclofenac sodium (VOLTAREN) 1 % GEL APPLY 2 TO 4 GRAMS TOPICALLY 4 TIMES DAILY 08/02/18   [provider]  gabapentin (NEURONTIN) 300 MG capsule Take 800 mg 3 (three) times daily by mouth.    [provider]  hydrOXYzine (ATARAX/VISTARIL) 25 MG tablet Can take 1-2 tablets 3 times a day as needed for anxiety 01/02/18  [provider]  lisinopril-hydrochlorothiazide (PRINZIDE,ZESTORETIC) 20-12.5 MG tablet Take 1 tablet by mouth daily. 07/12/16   [provider]  meloxicam (MOBIC) 15 MG tablet Take 15 mg by mouth daily. 07/20/18   [provider]  methylPREDNISolone (MEDROL DOSEPAK) 4 MG TBPK tablet 6 day dose pack - take as directed 01/16/19   Felecia Shelling, DPM  omeprazole (PRILOSEC) 20 MG capsule Take by mouth. 03/19/18 03/20/19  [provider]  rOPINIRole  (REQUIP) 0.5 MG tablet Take 1 tablet (0.5 mg total) by mouth at bedtime. 05/17/17   Fisher, Roselyn Bering, PA-C  topiramate (TOPAMAX) 50 MG tablet Take 50 mg by mouth at bedtime. 08/01/18   [provider]  Vitamin D, Ergocalciferol, (DRISDOL) 1.25 MG (50000 UT) CAPS capsule Take 50,000 Units by mouth once a week. 08/23/18   [provider]    Allergies Amoxicillin, Oxycodone, and Duloxetine  No family history on file.  Social History Social History   Tobacco Use   Smoking status: Never   Smokeless tobacco: Never  Vaping Use   Vaping Use: Never used  Substance Use Topics   Alcohol use: No   Drug use: No    Review of Systems  Constitutional: Negative for fever. Eyes: Negative for visual changes. ENT: Negative for sore throat. Neck: No neck pain  Cardiovascular: Negative for chest pain. Respiratory: Negative for shortness of breath. Gastrointestinal: Negative for abdominal pain. + nausea, vomiting. Genitourinary: Negative for dysuria. Musculoskeletal: Negative for back pain. Skin: Negative for rash. Neurological: Negative for headaches, weakness or numbness. Psych: No SI or HI  ____________________________________________   PHYSICAL EXAM:  VITAL SIGNS: ED Triage Vitals  Enc Vitals Group     BP 10/15/20 1803 (!) 173/95     Pulse Rate 10/15/20 1803 60     Resp 10/15/20 1803 18     Temp 10/15/20 1803 98 F (36.7 C)     Temp src --      SpO2 10/15/20 1803 100 %     Weight --      Height --      Head Circumference --      Peak Flow --      Pain Score 10/15/20 1801 0     Pain Loc --      Pain Edu? --      Excl. in GC? --     Constitutional: Alert and oriented. Well appearing and in no apparent distress. HEENT:      Head: Normocephalic and atraumatic.         Eyes: Conjunctivae are normal. Sclera is non-icteric.       Mouth/Throat: Mucous membranes are moist.       Neck: Supple with no signs of meningismus. Cardiovascular: Regular rate and rhythm.  No murmurs, gallops, or rubs. 2+ symmetrical distal pulses are present in all extremities. No JVD. Respiratory: Normal respiratory effort. Lungs are clear to auscultation bilaterally.  Gastrointestinal: Soft, non tender, and non distended with positive bowel sounds. No rebound or guarding. Genitourinary: No CVA tenderness. Musculoskeletal:  No edema, cyanosis, or erythema of extremities. Neurologic: Normal speech and language. Face is symmetric. Moving all extremities. No gross focal neurologic deficits are appreciated. Skin: Skin is warm, dry and intact. No rash noted. Psychiatric: Mood and affect are normal. Speech and behavior are normal.  ____________________________________________   LABS (all labs ordered are listed, but only abnormal results are displayed)  Labs Reviewed  COMPREHENSIVE METABOLIC PANEL - Abnormal; Notable for the following components:  Result Value   Potassium 3.4 (*)    Chloride 113 (*)    Total Protein 6.2 (*)    Alkaline Phosphatase 31 (*)    All other components within normal limits  CBC - Abnormal; Notable for the following components:   RBC 5.24 (*)    All other components within normal limits  URINALYSIS, COMPLETE (UACMP) WITH MICROSCOPIC - Abnormal; Notable for the following components:   Color, Urine YELLOW (*)    APPearance HAZY (*)    All other components within normal limits  LIPASE, BLOOD  TROPONIN I (HIGH SENSITIVITY)  TROPONIN I (HIGH SENSITIVITY)   ____________________________________________  EKG  ED ECG REPORT I, Nita Sickle, the attending physician, personally viewed and interpreted this ECG.  Sinus bradycardia with a rate of 43, normal intervals, diffuse T wave flattening with no ST elevations or depressions.  Unchanged from prior from 2020 ____________________________________________  RADIOLOGY  I have personally reviewed the images performed during this visit and I agree with the Radiologist's  read.   Interpretation by Radiologist:  DG Abdomen 1 View  Result Date: 10/16/2020 CLINICAL DATA:  Nausea and vomiting and belly pain EXAM: ABDOMEN - 1 VIEW COMPARISON:  None. FINDINGS: Scattered large and small bowel gas is noted. No abnormal mass is seen. Right renal stone is again identified and stable measuring up to 8 mm. No free air is seen. No acute bony abnormality is noted. IMPRESSION: Stable right renal calculus. No other focal abnormality is noted. Electronically Signed   By: Alcide Clever M.D.   On: 10/16/2020 00:15   DG Chest Portable 1 View  Result Date: 10/16/2020 CLINICAL DATA:  55 year old female with chest pain. EXAM: PORTABLE CHEST 1 VIEW COMPARISON:  Chest radiograph and CT dated 11/17/2017 FINDINGS: No focal consolidation, pleural effusion, or pneumothorax. The cardiac silhouette is within normal limits. No acute osseous pathology. IMPRESSION: No active disease. Electronically Signed   By: Elgie Collard M.D.   On: 10/16/2020 02:24     ____________________________________________   PROCEDURES  Procedure(s) performed:yes .1-3 Lead EKG Interpretation  Date/Time: 10/16/2020 1:50 AM Performed by: Nita Sickle, MD Authorized by: Nita Sickle, MD     Interpretation: non-specific     ECG rate assessment: bradycardic     Rhythm: sinus bradycardia     Ectopy: none     Conduction: normal    Critical Care performed:  None ____________________________________________   INITIAL IMPRESSION / ASSESSMENT AND PLAN / ED COURSE  55 y.o. female with a history obesity, chronic back pain, depression, OSA, hypertension, anxiety who presents for evaluation of nausea and vomiting and a sensation of a tightening bra around her lower chest.  Patient is well-appearing in no distress, bradycardic but other vital signs are within normal limits.  EKG showing sinus bradycardia which seems to be unchanged since 2020.  Strong equal pulses in all 4 extremities, heart regular rate  and rhythm, lungs are clear to auscultation, abdomen is soft with no tenderness throughout.  Differential diagnoses including esophageal spasms versus GERD/indigestion versus ACS versus gastritis versus anxiety versus withdrawal from stopping venlafaxine cold [place].  No prior abdominal surgery, no distention, no abdominal pain or tenderness therefore less likely an obstruction.  KUB showing no air-fluid levels, visualized by me confirmed by radiology.  Labs showing no leukocytosis, no anemia, normal electrolytes, normal LFTs and lipase.  2 high-sensitivity troponin negative. CXR visualized by me with no acute findings, confirmed by radiology.  Will give IV fluids and Zofran for symptom relief.  Will  give a dose of her venlafaxine. We will give an aspirin.  Patient placed on telemetry for monitoring of cardiorespiratory status.  Old medical records reviewed    _________________________ 4:06 AM on 10/16/2020 ----------------------------------------- Patient feels improved, tolerating p.o. no further episodes of nausea or vomiting.  No further episodes of chest pain.  Will discharge home with increase oral hydration, Zofran.  Patient provided with 2 weeks her venlafaxine until she is able to get a refill from her PCP.  Discussed importance of not stopping psychiatric medications cold [place].  Discussed my standard return precautions for chest pain, shortness of breath or abdominal pain.  _____________________________________________ Please note:  Patient was evaluated in Emergency Department today for the symptoms described in the history of present illness. Patient was evaluated in the context of the global COVID-19 pandemic, which necessitated consideration that the patient might be at risk for infection with the SARS-CoV-2 virus that causes COVID-19. Institutional protocols and algorithms that pertain to the evaluation of patients at risk for COVID-19 are in a state of rapid change based on information  released by regulatory bodies including the CDC and federal and state organizations. These policies and algorithms were followed during the patient's care in the ED.  Some ED evaluations and interventions may be delayed as a result of limited staffing during the pandemic.   [place] Controlled Substance Database was reviewed by me. ____________________________________________   FINAL CLINICAL IMPRESSION(S) / ED DIAGNOSES   Final diagnoses:  Non-intractable vomiting with nausea, unspecified vomiting type      NEW MEDICATIONS STARTED DURING THIS VISIT:  ED Discharge Orders          Ordered    ondansetron (ZOFRAN ODT) 4 MG disintegrating tablet  Every 8 hours PRN        10/16/20 0405    venlafaxine XR (EFFEXOR XR) 37.5 MG 24 hr capsule  Daily        10/16/20 0405             Note:  This document was prepared using Dragon voice recognition software and may include unintentional dictation errors.    Don PerkingVeronese, [place], MD 10/16/20 [phone]

## 2020-10-16 NOTE — ED Notes (Addendum)
Pt ambulatory to RR at this time, unassisted.  

## 2021-01-02 ENCOUNTER — Other Ambulatory Visit: Payer: Self-pay

## 2021-01-02 ENCOUNTER — Encounter: Payer: Self-pay | Admitting: Emergency Medicine

## 2021-01-02 ENCOUNTER — Ambulatory Visit
Admission: EM | Admit: 2021-01-02 | Discharge: 2021-01-02 | Disposition: A | Discharge status: Home / Self Care | Payer: Medicare Other | Attending: Physician Assistant | Admitting: Physician Assistant

## 2021-01-02 DIAGNOSIS — Z20822 Contact with and (suspected) exposure to covid-19: Secondary | ICD-10-CM | POA: Diagnosis not present

## 2021-01-02 DIAGNOSIS — Z880 Allergy status to penicillin: Secondary | ICD-10-CM | POA: Diagnosis not present

## 2021-01-02 DIAGNOSIS — Z791 Long term (current) use of non-steroidal anti-inflammatories (NSAID): Secondary | ICD-10-CM | POA: Insufficient documentation

## 2021-01-02 DIAGNOSIS — B349 Viral infection, unspecified: Secondary | ICD-10-CM | POA: Insufficient documentation

## 2021-01-02 DIAGNOSIS — Z885 Allergy status to narcotic agent status: Secondary | ICD-10-CM | POA: Diagnosis not present

## 2021-01-02 DIAGNOSIS — Z79899 Other long term (current) drug therapy: Secondary | ICD-10-CM | POA: Insufficient documentation

## 2021-01-02 DIAGNOSIS — R490 Dysphonia: Secondary | ICD-10-CM | POA: Diagnosis not present

## 2021-01-02 DIAGNOSIS — R059 Cough, unspecified: Secondary | ICD-10-CM | POA: Diagnosis not present

## 2021-01-02 DIAGNOSIS — R0981 Nasal congestion: Secondary | ICD-10-CM | POA: Diagnosis not present

## 2021-01-02 LAB — SARS CORONAVIRUS 2 (TAT 6-24 HRS): SARS Coronavirus 2: NEGATIVE

## 2021-01-02 MED ORDER — PROMETHAZINE-DM 6.25-15 MG/5ML PO SYRP: 5.0000 mL | ORAL_SOLUTION | Freq: Four times a day (QID) | ORAL | 0 refills | Status: DC | PRN

## 2021-01-02 MED ORDER — FLUTICASONE PROPIONATE 50 MCG/ACT NA SUSP: 2.0000 | Freq: Every day | NASAL | 0 refills | Status: DC

## 2021-01-02 NOTE — ED Provider Notes (Signed)
[facility]    CSN: [id number] Arrival date & time: 01/02/21  1224      History   Chief Complaint Chief Complaint  Patient presents with   Cough    HPI ALEIGHA GILANI is a 55 y.o. female presenting for 2-day history of cough and nasal congestion.  Also mitts to fatigue.  She says she has voice hoarseness but denies sore throat.  No chest pain or breathing difficulty.  Patient denies any vomiting or diarrhea.  No sick contacts.  No known exposure to COVID-19.  Vaccinated for COVID-19 x2.  Patient has taken over-the-counter decongestants without much improvement in symptoms.  She says cough is productive and she is concerned about that and the infection moving to her chest.  Past medical history significant for hypertension and obstructive sleep apnea.  No other complaints.  HPI  Past Medical History:  Diagnosis Date   Anxiety    Chronic back pain    H/O degenerative disc disease    Hypertension    Sciatica     Patient Active Problem List   Diagnosis Date Noted   Class 2 obesity 06/13/2018   Arthritis of right knee 01/31/2018   DDD (degenerative disc disease), lumbar 01/31/2018   Pain medication agreement signed 01/31/2018   Chronic back pain 01/02/2018   Pain in both feet 08/31/2017   Knee pain, bilateral 07/05/2017   Depression 06/07/2017   OSA (obstructive sleep apnea) 05/12/2017   Closed nondisplaced fracture of anterior wall of left acetabulum (HCC) 08/27/2016   Localized osteoarthritis of right knee 08/27/2016   Closed fracture of posterior wall of left acetabulum (HCC) 08/23/2016   HTN (hypertension) 06/21/2016   Allergic rhinitis 12/06/2013   Microcytic hypochromic anemia 12/06/2013    History reviewed. No pertinent surgical history.  OB History   No obstetric history on file.      Home Medications    Prior to Admission medications   Medication Sig Start Date End Date Taking? Authorizing Provider  amLODipine (NORVASC) 5 MG tablet Take 5  mg by mouth daily. 07/12/16  Yes [provider]  fluticasone (FLONASE) 50 MCG/ACT nasal spray Place 2 sprays into both nostrils daily. 01/02/21  Yes Eusebio Friendly B, PA-C  hydrOXYzine (ATARAX/VISTARIL) 25 MG tablet Can take 1-2 tablets 3 times a day as needed for anxiety 01/02/18  Yes [provider]  lisinopril-hydrochlorothiazide (PRINZIDE,ZESTORETIC) 20-12.5 MG tablet Take 1 tablet by mouth daily. 07/12/16  Yes [provider]  meloxicam (MOBIC) 15 MG tablet Take 15 mg by mouth daily. 07/20/18  Yes [provider]  omeprazole (PRILOSEC) 20 MG capsule Take by mouth. 03/19/18 01/02/21 Yes [provider]  promethazine-dextromethorphan (PROMETHAZINE-DM) 6.25-15 MG/5ML syrup Take 5 mLs by mouth 4 (four) times daily as needed for cough. 01/02/21  Yes Shirlee Latch, PA-C  rOPINIRole (REQUIP) 0.5 MG tablet Take 1 tablet (0.5 mg total) by mouth at bedtime. 05/17/17  Yes Fisher, Roselyn Bering, PA-C  topiramate (TOPAMAX) 50 MG tablet Take 50 mg by mouth at bedtime. 08/01/18  Yes [provider]  venlafaxine XR (EFFEXOR XR) 37.5 MG 24 hr capsule Take 1 capsule (37.5 mg total) by mouth daily. 10/16/20 10/16/21 Yes Veronese, [place], MD  baclofen (LIORESAL) 10 MG tablet Take 10 mg by mouth 2 (two) times daily. 12/14/20   [provider]  BELBUCA 75 MCG FILM APPLY 75 MCG TO CHEEK TWICE DAILY FOR CHRONIC PAIN. REPLACING PATCH 12/25/18   [provider]  Buprenorphine 15 MCG/HR PTWK APPLY  1 PATCH TOPICALLY TO SKIN ONCE A WEEK 08/21/18   [provider]  diclofenac sodium (VOLTAREN) 1 % GEL APPLY 2 TO 4 GRAMS TOPICALLY 4 TIMES DAILY 08/02/18   [provider]  gabapentin (NEURONTIN) 300 MG capsule Take 800 mg 3 (three) times daily by mouth.    [provider]  methylPREDNISolone (MEDROL DOSEPAK) 4 MG TBPK tablet 6 day dose pack - take as directed 01/16/19   Felecia Shelling, DPM  ondansetron (ZOFRAN ODT) 4 MG disintegrating tablet Take 1  tablet (4 mg total) by mouth every 8 (eight) hours as needed. 10/16/20   Nita Sickle, MD  pregabalin (LYRICA) 50 MG capsule Take 50 mg by mouth 3 (three) times daily. 12/22/20   [provider]  Vitamin D, Ergocalciferol, (DRISDOL) 1.25 MG (50000 UT) CAPS capsule Take 50,000 Units by mouth once a week. 08/23/18   [provider]    Family History History reviewed. No pertinent family history.  Social History Social History   Tobacco Use   Smoking status: Never   Smokeless tobacco: Never  Vaping Use   Vaping Use: Never used  Substance Use Topics   Alcohol use: No   Drug use: No     Allergies   Amoxicillin, Oxycodone, and Duloxetine   Review of Systems Review of Systems  Constitutional:  Positive for fatigue. Negative for chills, diaphoresis and fever.  HENT:  Positive for congestion, rhinorrhea and voice change. Negative for ear pain, sinus pressure, sinus pain and sore throat.   Respiratory:  Positive for cough. Negative for shortness of breath.   Gastrointestinal:  Negative for abdominal pain, nausea and vomiting.  Musculoskeletal:  Negative for arthralgias and myalgias.  Skin:  Negative for rash.  Neurological:  Negative for weakness and headaches.  Hematological:  Negative for adenopathy.    Physical Exam Triage Vital Signs ED Triage Vitals  Enc Vitals Group     BP 01/02/21 1247 (!) 147/89     Pulse Rate 01/02/21 1247 (!) 56     Resp 01/02/21 1247 14     Temp 01/02/21 1247 98.1 F (36.7 C)     Temp Source 01/02/21 1247 Oral     SpO2 01/02/21 1247 100 %     Weight 01/02/21 1241 227 lb (103 kg)     Height 01/02/21 1241 5\' 6"  (1.676 m)     Head Circumference --      Peak Flow --      Pain Score 01/02/21 1241 0     Pain Loc --      Pain Edu? --      Excl. in GC? --    No data found.  Updated Vital Signs BP (!) 147/89 (BP Location: Left Arm)   Pulse (!) 56   Temp 98.1 F (36.7 C) (Oral)   Resp 14   Ht 5\' 6"  (1.676 m)   Wt 227 lb  (103 kg)   LMP 12/16/2014   SpO2 100%   BMI 36.64 kg/m   Physical Exam Vitals and nursing note reviewed.  Constitutional:      General: She is not in acute distress.    Appearance: Normal appearance. She is not ill-appearing or toxic-appearing.  HENT:     Head: Normocephalic and atraumatic.     Nose: Congestion present.     Mouth/Throat:     Mouth: Mucous membranes are moist.     Pharynx: Oropharynx is clear.     Comments: Mild/moderate PND Eyes:  General: No scleral icterus.       Right eye: No discharge.        Left eye: No discharge.     Conjunctiva/sclera: Conjunctivae normal.  Cardiovascular:     Rate and Rhythm: Regular rhythm. Bradycardia present.     Heart sounds: Normal heart sounds.  Pulmonary:     Effort: Pulmonary effort is normal. No respiratory distress.     Breath sounds: Normal breath sounds. No wheezing, rhonchi or rales.  Musculoskeletal:     Cervical back: Neck supple.  Skin:    General: Skin is dry.  Neurological:     General: No focal deficit present.     Mental Status: She is alert. Mental status is at baseline.     Motor: No weakness.     Gait: Gait normal.  Psychiatric:        Mood and Affect: Mood normal.        Behavior: Behavior normal.        Thought Content: Thought content normal.     UC Treatments / Results  Labs (all labs ordered are listed, but only abnormal results are displayed) Labs Reviewed  SARS CORONAVIRUS 2 (TAT 6-24 HRS)    EKG   Radiology No results found.  Procedures Procedures (including critical care time)  Medications Ordered in UC Medications - No data to display  Initial Impression / Assessment and Plan / UC Course  I have reviewed the triage vital signs and the nursing notes.  Pertinent labs & imaging results that were available during my care of the patient were reviewed by me and considered in my medical decision making (see chart for details).  55 year old female presenting for 2-day history of  cough, congestion and fatigue.  Patient denies fevers or weakness.  Vitals are stable.  She is afebrile.  Patient has nasal congestion on exam as well as postnasal drainage.  Chest is clear to auscultation heart regular rate and rhythm.  PCR COVID test obtained.  Current CDC guidelines, isolation protocol and ED precautions reviewed with patient.  If COVID-positive, patient would be a candidate for antiviral therapy which she would be interested in discussing further.  Reviewed trying the Promethazine DM for cough as well as increasing rest and fluids.  Have sent Flonase as well.  Return and ED precautions reviewed.  Final Clinical Impressions(s) / UC Diagnoses   Final diagnoses:  Viral illness  Cough  Nasal congestion     Discharge Instructions      URI/COLD SYMPTOMS: Your exam today is consistent with a viral illness. Antibiotics are not indicated at this time. Use medications as directed, including cough syrup, nasal saline, and decongestants. Your symptoms should improve over the next few days and resolve within 7-10 days. Increase rest and fluids. F/u if symptoms worsen or predominate such as sore throat, ear pain, productive cough, shortness of breath, or if you develop high fevers or worsening fatigue over the next several days.    You have received COVID testing today either for positive exposure, concerning symptoms that could be related to COVID infection, screening purposes, or re-testing after confirmed positive.  Your test obtained today checks for active viral infection in the last 1-2 weeks. If your test is negative now, you can still test positive later. So, if you do develop symptoms you should either get re-tested and/or isolate x 5 days and then strict mask use x 5 days (unvaccinated) or mask use x 10 days (vaccinated). Please follow CDC guidelines.  While Rapid antigen tests come back in 15-20 minutes, send out PCR/molecular test results typically come back within 1-3 days. In  the mean time, if you are symptomatic, assume this could be a positive test and treat/monitor yourself as if you do have COVID.   We will call with test results if positive. Please download the MyChart app and set up a profile to access test results.   If symptomatic, go home and rest. Push fluids. Take Tylenol as needed for discomfort. Gargle warm salt water. Throat lozenges. Take Mucinex DM or Robitussin for cough. Humidifier in bedroom to ease coughing. Warm showers. Also review the COVID handout for more information.  COVID-19 INFECTION: The incubation period of COVID-19 is approximately 14 days after exposure, with most symptoms developing in roughly 4-5 days. Symptoms may range in severity from mild to critically severe. Roughly 80% of those infected will have mild symptoms. People of any age may become infected with COVID-19 and have the ability to transmit the virus. The most common symptoms include: fever, fatigue, cough, body aches, headaches, sore throat, nasal congestion, shortness of breath, nausea, vomiting, diarrhea, changes in smell and/or taste.    COURSE OF ILLNESS Some patients may begin with mild disease which can progress quickly into critical symptoms. If your symptoms are worsening please call ahead to the Emergency Department and proceed there for further treatment. Recovery time appears to be roughly 1-2 weeks for mild symptoms and 3-6 weeks for severe disease.   GO IMMEDIATELY TO ER FOR FEVER YOU ARE UNABLE TO GET DOWN WITH TYLENOL, BREATHING PROBLEMS, CHEST PAIN, FATIGUE, LETHARGY, INABILITY TO EAT OR DRINK, ETC  QUARANTINE AND ISOLATION: To help decrease the spread of COVID-19 please remain isolated if you have COVID infection or are highly suspected to have COVID infection. This means -stay home and isolate to one room in the home if you live with others. Do not share a bed or bathroom with others while ill, sanitize and wipe down all countertops and keep common areas clean  and disinfected. Stay home for 5 days. If you have no symptoms or your symptoms are resolving after 5 days, you can leave your house. Continue to wear a mask around others for 5 additional days. If you have been in close contact (within 6 feet) of someone diagnosed with COVID 19, you are advised to quarantine in your home for 14 days as symptoms can develop anywhere from 2-14 days after exposure to the virus. If you develop symptoms, you  must isolate.  Most current guidelines for COVID after exposure -unvaccinated: isolate 5 days and strict mask use x 5 days. Test on day 5 is possible -vaccinated: wear mask x 10 days if symptoms do not develop -You do not necessarily need to be tested for COVID if you have + exposure and  develop symptoms. Just isolate at home x10 days from symptom onset During this global pandemic, CDC advises to practice social distancing, try to stay at least 53ft away from others at all times. Wear a face covering. Wash and sanitize your hands regularly and avoid going anywhere that is not necessary.  KEEP IN MIND THAT THE COVID TEST IS NOT 100% ACCURATE AND YOU SHOULD STILL DO EVERYTHING TO PREVENT POTENTIAL SPREAD OF VIRUS TO OTHERS (WEAR MASK, WEAR GLOVES, WASH HANDS AND SANITIZE REGULARLY). IF INITIAL TEST IS NEGATIVE, THIS MAY NOT MEAN YOU ARE DEFINITELY NEGATIVE. MOST ACCURATE TESTING IS DONE 5-7 DAYS AFTER EXPOSURE.   It is not advised by  CDC to get re-tested after receiving a positive COVID test since you can still test positive for weeks to months after you have already cleared the virus.   *If you have not been vaccinated for COVID, I strongly suggest you consider getting vaccinated as long as there are no contraindications.       ED Prescriptions     Medication Sig Dispense Auth. Provider   promethazine-dextromethorphan (PROMETHAZINE-DM) 6.25-15 MG/5ML syrup Take 5 mLs by mouth 4 (four) times daily as needed for cough. 118 mL Eusebio Friendly B, PA-C   fluticasone  (FLONASE) 50 MCG/ACT nasal spray Place 2 sprays into both nostrils daily. 1 g Shirlee Latch, PA-C      PDMP not reviewed this encounter.   Shirlee Latch, PA-C 01/02/21 1350

## 2021-01-02 NOTE — Discharge Instructions (Signed)

## 2021-01-02 NOTE — ED Triage Notes (Signed)
Patient c/o cough and chest congestion since Wed.  Patient denies fevers.  Patient denies SOB or difficulty breathing.

## 2021-04-09 HISTORY — PX: TOTAL KNEE ARTHROPLASTY: SHX125

## 2021-04-28 ENCOUNTER — Other Ambulatory Visit: Payer: Self-pay

## 2021-04-28 ENCOUNTER — Ambulatory Visit: Payer: Medicare Other | Attending: Orthopedic Surgery | Admitting: Physical Therapy

## 2021-04-28 DIAGNOSIS — M6281 Muscle weakness (generalized): Secondary | ICD-10-CM | POA: Insufficient documentation

## 2021-04-28 DIAGNOSIS — G8929 Other chronic pain: Secondary | ICD-10-CM | POA: Diagnosis present

## 2021-04-28 DIAGNOSIS — M25561 Pain in right knee: Secondary | ICD-10-CM | POA: Insufficient documentation

## 2021-04-28 DIAGNOSIS — R262 Difficulty in walking, not elsewhere classified: Secondary | ICD-10-CM | POA: Diagnosis present

## 2021-04-28 DIAGNOSIS — M25661 Stiffness of right knee, not elsewhere classified: Secondary | ICD-10-CM | POA: Diagnosis present

## 2021-04-28 NOTE — Therapy (Signed)
[facility] PHYS[facility] AND SP[facility] MEDICINE [address] [place], [place], [address] Phone: [phone]   Fax:  [phone]  Physical Therapy Evaluation  Patient Details  Name: Ariel Davis MRN: [id number] Date of Birth: 06-Nov-1965 Referring Provider (PT): Kimberlee Nearinghristopher William Olcott, MD   Encounter Date: 04/28/2021   PT End of Session - 04/28/21 1713     Visit Number 1    Number of Visits 24    Date for PT Re-Evaluation 07/21/21    Authorization Type [employer]ICARE reporting period from 04/28/2021    Progress Note Due on Visit 10    PT Start Time 1437    PT Stop Time 1515    PT Time Calculation (min) 38 min    Activity Tolerance Patient limited by pain    Behavior During Therapy [facility] for tasks assessed/performed             Past Medical History:  Diagnosis Date   Anxiety    Chronic back pain    H/O degenerative disc disease    Hypertension    Sciatica     No past surgical history on file.  There were no vitals filed for this visit.    Subjective Assessment - 04/28/21 1446     Subjective Patient states she was trying to get a knee replacement in 2019 but she was told she needed to lose weight. She lost her weight and she went back and she underwent her TKA on April 09, 2021. Pateint's granddaughter who is [id number] lives with her. She is out of the house during the daytime. Her daughter Glee ArvinLatoya brought her to today's appointment and is helping with IADLs since surgery.  Patient reports she has used a walker every since a doctor prescribed years ago it so she can walk straighter because of her back. Patient was I with aspects of care. Patient reports she has not been able to take pain medications without intolerable side effects or no effect on pain so she has been managing pain with tylenol. She just completed home health PT.    Patient is accompained by: Family member    Pertinent History [name] who  presents to outpatient physical therapy with a referral for medical diagnosis status post right total knee replacement. This patient's chief complaints consist of right knee pain and stiffness following R TKA on 04/09/2021 leading to the following functional deficits: difficulty with mobility including bed mobility, transfers, household and community ambulation, stairs; completing ADLs and IADLs such as cooking and cleaning..  Relevant past medical history and comorbidities include chronic back pain (does intermittently go down both legs), anxiety, sleep apnea, left hip fracture (2018 still bothers her, non surgical), bilateral foot pain, microcytic hypochromatic anemia, bilateral knee pain, obesity.  Patient denies hx of cancer, stroke, seizures, lung problem, major cardiac events, diabetes, unexplained weight loss, changes in bowel or bladder problems, new onset stumbling or dropping things, spinal surgery, left hip surgery.    Limitations Sitting;House hold activities;Lifting;Standing;Walking;Other (comment)   mobility including bed mobility, transfers, household and community ambulation, stairs; completing ADLs and IADLs such as cooking and cleaning.   Patient Stated Goals "get up out of physical therapy and walk right"    Currently in Pain? Yes    Pain Score 5    W: 9-10/10. B: 0/10   Pain Location Knee    Pain Orientation Right;Anterior    Pain Descriptors / Indicators Aching;Throbbing    Pain Type  Surgical pain    Pain Radiating Towards notes some numbness over the lateral knee and lower leg, has increased pain in R foot since surgery.    Pain Onset 1 to 4 weeks ago    Pain Frequency Constant    Aggravating Factors  keeping it down and still a long time    Pain Relieving Factors getting up and moving and then lay back down, medications    Effect of Pain on Daily Activities Functional Limitations: difficulty with mobility including bed mobility, transfers, household and community ambulation,  stairs; completing ADLs and IADLs such as cooking and cleaning.               [facility] PT Assessment - 04/28/21 0001       Assessment   Medical Diagnosis status post right total knee replacement    Referring Provider (PT) Kimberlee Nearing, MD    Onset Date/Surgical Date 04/09/21    Hand Dominance Right    Prior Therapy home health PT      Precautions   Precautions Fall      Restrictions   Weight Bearing Restrictions Yes    Other Position/Activity Restrictions Right LE WBAT with walker      Balance Screen   Has the patient fallen in the past 6 months No    Has the patient had a decrease in activity level because of a fear of falling?  No    Is the patient reluctant to leave their home because of a fear of falling?  No      Home Environment   Living Environment Private residence   no concerns about getting around home.   Living Arrangements Other relatives      Prior Function   Level of Independence Requires assistive device for independence;Independent      Cognition   Overall Cognitive Status Within Functional Limits for tasks assessed              OBJECTIVE  SELF- REPORTED FUNCTION FOTO score: 42/100 (knee questionnaire)  OBSERVATION/INSPECTION Posture Posture (seated): Keeps R foot supported by floor or other foot avoiding flexion past about 60 degrees.  Posture (standing): L knee genuvalgum, shifts weight away from R LE with slight flexion in R knee.  Anthropometrics Tremor: none Body composition:  BMI 37.1 Muscle bulk: WFL Skin: The incision sites appear to be healing well with no excessive redness, warmth, drainage or signs of infection present.   Edema: moderate B LE Functional Mobility Bed mobility: supine <> sit mod I to min A (difficulty moving R LE) Transfers: sit <> stand mod I with increased time, use of BUE, and R knee extended.  Gait: ambulates in/out of clinic with RW with minimal R knee flexion and antalgic gait favoring R  LE.   PERIPHERAL JOINT MOTION (in degrees) Active Range of Motion (AROM) *Indicates pain 04/28/21 Date Date  Joint/Motion R/L R/L R/L  Knee     Extension Lacking 30/ / /  Flexion 80*/ / /  Comments:  04/28/21: L knee WFL, R knee demo extensor lag in ASLR  Passive Range of Motion (PROM) *Indicates pain Date Date Date  Joint/Motion R/L R/L R/L  Knee     Extension -5/4 / /  Flexion 80*/128 / /  Comments:  04/28/2021: B hips and ankles appear WFL except pain at left hip with flexion.   MUSCLE PERFORMANCE (MMT):  *Indicates pain 04/28/21 Date Date  Joint/Motion R/L R/L R/L  Hip     Flexion (L1,  L2) 2/4 / /  Abduction (supine) 3/4 / /  Knee     Extension (L3) 4/4+ / /  Flexion (S2) 4/4 / /  Ankle/Foot     Dorsiflexion (L4) 5/5 / /  Great toe extension (L5) 5/5 / /  Eversion (S1) 4/4 / /  Plantarflexion (S1) 4/4 / /  Comments 04/28/2021: pain at right knee with knee motions, pain at left knee and slipping felt in joint with left knee extension.   FUNCTIONAL/BALANCE TESTS: Timed Up and Go: 9.93 seconds (average of 3 trials), With RW and B UE support on chair arms, SBA.  Trial 1: 11.38 Trial 2: 9.38 Trial 3: 9.03  Objective measurements completed on examination: See above findings.   TREATMENT:   Therapeutic exercise: to centralize symptoms and improve ROM, strength, muscular endurance, and activity tolerance required for successful completion of functional activities.  - supine R quad set with towel roll behind knee and heavy cuing, 1x10  - hooklying R ASLR with towel roll behind knee, 1x3 (extensor lag present, reports a lot of pain).  - Education on diagnosis, prognosis, POC, anatomy and physiology of current condition.  - Education on HEP including handout   Pt required multimodal cuing for proper technique and to facilitate improved neuromuscular control, strength, range of motion, and functional ability resulting in improved performance and form.  HOME EXERCISE  PROGRAM Access Code: [email or web address] URL: [email or web address] Date: 04/28/2021 Prepared by: Norton BlizzardSara Maraki Macquarrie  Exercises Supine Quad Set - 3 x daily - 2 sets - 10 reps - 5 seconds hold Supine Active Straight Leg Raise - 1 x daily - 3 sets - 5 reps    PT Education - 04/28/21 1712     Education Details Exercise purpose/form. Self management techniques. Education on diagnosis, prognosis, POC, anatomy and physiology of current condition Education on HEP including handout    Person(s) Educated Patient    Methods Explanation;Demonstration;Tactile cues;Verbal cues;Handout    Comprehension Returned demonstration;Verbalized understanding;Verbal cues required;Tactile cues required;Need further instruction              PT Short Term Goals - 04/28/21 1720       PT SHORT TERM GOAL #1   Title Be independent with initial home exercise program for self-management of symptoms.    Baseline Initial HEP provided at IE (04/28/2021);    Time 2    Period Weeks    Status New    Target Date 05/12/21               PT Long Term Goals - 04/28/21 1721       PT LONG TERM GOAL #1   Title Be independent with a long-term home exercise program for self-management of symptoms.    Baseline Initial HEP provided at IE (04/28/2021);    Time 12    Period Weeks    Status New   TARGET DATE FOR ALL LONG TERM GOALS: 07/21/2021     PT LONG TERM GOAL #2   Title Demonstrate improved FOTO to equal or greater than 60 by visit #14 to demonstrate improvement in overall condition and self-reported functional ability.    Baseline 42 (04/28/2021);    Time 12    Period Weeks    Status New      PT LONG TERM GOAL #3   Title Paitent will demonstrate R knee PROM equal or greater than 0-115 degrees to improve ability to step over objects, get up from a chair, and use the stairs.  Baseline 0-5-80 (04/28/2021);    Time 12    Period Weeks    Status New      PT LONG TERM GOAL #4   Title Patient will travel 1000  feet or more on 6 Minute Walk Test with LRAD to demonstrate improved community mobility.    Baseline to be tested visit 2 as appropriate (04/28/2021);    Time 12    Period Weeks    Status New      PT LONG TERM GOAL #5   Title Complete community, work and/or recreational activities without limitation due to current condition.    Baseline Functional Limitations: difficulty with mobility including bed mobility, transfers, household and community ambulation, stairs; completing ADLs and IADLs such as cooking and cleaning (04/28/2021);    Time 12    Period Weeks    Status New      Additional Long Term Goals   Additional Long Term Goals Yes      PT LONG TERM GOAL #6   Title Patient will complete 5 Time Sit to Stand Test in equal or less than 15 seconds from 18 inch plinth without use of B UE to demonstrate improved LE strength and power to improve household and community mobility and demonstrate decreased fall risk.    Baseline to be tested visit 2 as appropriate - currently needs B UE support to transfer sit <> stand (04/28/2021);    Time 12    Period Weeks    Status New                    Plan - 04/28/21 1734     Clinical Impression Statement [name] referred to outpatient physical therapy with a medical diagnosis of status post right total knee replacement who presents with signs and symptoms consistent with right knee pain, stiffness, and dysfunction s/p R TKR performed 04/09/2021. Patient presents with significant ROM, joint stiffness, motor control, muscle tension, muscle performance (strength/power/endurance) and activity tolerance impairments that are limiting ability to complete her usual activities such as mobility including bed mobility, transfers, household and community ambulation, stairs; completing ADLs and IADLs such as cooking and cleaning without difficulty. Patient will benefit from skilled physical therapy intervention to address current body structure  impairments and activity limitations to improve function and work towards goals set in current POC in order to return to prior level of function or maximal functional improvement.    Personal Factors and Comorbidities Fitness;Time since onset of injury/illness/exacerbation;Comorbidity 3+;Past/Current Experience;Transportation    Comorbidities chronic back pain (does intermittently go down both legs), anxiety, sleep apnea, left hip fracture (2018 still bothers her, non surgical), bilateral foot pain, microcytic hypochromatic anemia, bilateral knee pain, obesity    Examination-Activity Limitations Bed Mobility;Lift;Stairs;Squat;Stand;Locomotion Level;Carry;Sit;Sleep;Dressing;Transfers    Examination-Participation Restrictions Cleaning;Shop;Laundry;Meal Prep;Community Activity;Interpersonal Relationship    Stability/Clinical Decision Making Stable/Uncomplicated    Clinical Decision Making Low    Rehab Potential Good    PT Frequency 2x / week    PT Duration 12 weeks    PT Treatment/Interventions ADLs/Self Care Home Management;Aquatic Therapy;Cryotherapy;Electrical Stimulation;Moist Heat;Gait training;Stair training;Functional mobility training;DME Instruction;Therapeutic activities;Therapeutic exercise;Balance training;Neuromuscular re-education;Manual techniques;Dry needling;Passive range of motion;Energy conservation;Patient/family education    PT Next Visit Plan , 5TSTS, quad strengthening, ROM    PT Home Exercise Plan Medbridge Access Code: [email or web address]    Consulted and Agree with Plan of Care Patient             Patient will benefit from skilled therapeutic  intervention in order to improve the following deficits and impairments:  Abnormal gait, Decreased skin integrity, Increased fascial restricitons, Impaired sensation, Improper body mechanics, Pain, Postural dysfunction, Increased muscle spasms, Decreased scar mobility, Decreased mobility, Decreased coordination, Decreased activity tolerance,  Decreased endurance, Decreased range of motion, Decreased strength, Hypomobility, Impaired perceived functional ability, Decreased balance, Difficulty walking, Increased edema, Obesity  Visit Diagnosis: Chronic pain of right knee  Stiffness of right knee, not elsewhere classified  Muscle weakness (generalized)  Difficulty in walking, not elsewhere classified     Problem List Patient Active Problem List   Diagnosis Date Noted   Class 2 obesity 06/13/2018   Arthritis of right knee 01/31/2018   DDD (degenerative disc disease), lumbar 01/31/2018   Pain medication agreement signed 01/31/2018   Chronic back pain 01/02/2018   Pain in both feet 08/31/2017   Knee pain, bilateral 07/05/2017   Depression 06/07/2017   OSA (obstructive sleep apnea) 05/12/2017   Closed nondisplaced fracture of anterior wall of left acetabulum (HCC) 08/27/2016   Localized osteoarthritis of right knee 08/27/2016   Closed fracture of posterior wall of left acetabulum (HCC) 08/23/2016   HTN (hypertension) 06/21/2016   Allergic rhinitis 12/06/2013   Microcytic hypochromic anemia 12/06/2013    [place]ley Dec R. Ilsa Iha, PT, DPT 04/28/21, 5:40 PM   [facility] PHYSICAL AND SPORTS MEDICINE [address], [place], [address] Phone: [phone]   Fax:  [phone]  Name: SHEVELLE SMITHER MRN: [id number] Date of Birth: Aug 04, 1965

## 2021-04-30 ENCOUNTER — Encounter: Payer: Self-pay | Admitting: Physical Therapy

## 2021-04-30 ENCOUNTER — Other Ambulatory Visit: Payer: Self-pay

## 2021-04-30 ENCOUNTER — Ambulatory Visit: Payer: Medicare Other | Admitting: Physical Therapy

## 2021-04-30 DIAGNOSIS — M6281 Muscle weakness (generalized): Secondary | ICD-10-CM

## 2021-04-30 DIAGNOSIS — M25661 Stiffness of right knee, not elsewhere classified: Secondary | ICD-10-CM

## 2021-04-30 DIAGNOSIS — G8929 Other chronic pain: Secondary | ICD-10-CM

## 2021-04-30 DIAGNOSIS — M25561 Pain in right knee: Secondary | ICD-10-CM | POA: Diagnosis not present

## 2021-04-30 DIAGNOSIS — R262 Difficulty in walking, not elsewhere classified: Secondary | ICD-10-CM

## 2021-04-30 NOTE — Therapy (Signed)
[place] [facility] [address] [place], [address] Phone: [phone]   Fax:  [phone]  Physical Therapy Treatment  Patient Details  Name: Ariel Davis MRN: [id number] Date of Birth: [birth year] Referring Provider (PT): Marlana Latus, MD   Encounter Date: 04/30/2021   PT End of Session - 04/30/21 1538     Visit Number 2    Number of Visits 24    Date for PT Re-Evaluation 07/21/21    Authorization Type [employer]ICARE reporting period from 04/28/2021    Progress Note Due on Visit 10    PT Start Time 1535    PT Stop Time 1600    PT Time Calculation (min) 25 min    Activity Tolerance Patient limited by pain    Behavior During Therapy [facility] for tasks assessed/performed             Past Medical History:  Diagnosis Date   Anxiety    Chronic back pain    H/O degenerative disc disease    Hypertension    Sciatica     History reviewed. No pertinent surgical history.  There were no vitals filed for this visit.   Subjective Assessment - 04/30/21 1533     Subjective Patient reports she is feeling well today with no pain in her knee upon arrival. She state she was okay after last PT session. She took some medication and iced it. She is late and still needs to use the restroom because she thought her appointment was at 3:30pm when it was scheduled for 3:15pm. She has 5/10 pain in her left buttock which she states is a normal location for her but the severity has been worse.    Patient is accompained by: Family member    Pertinent History [name] who presents to outpatient physical therapy with a referral for medical diagnosis status post right total knee replacement. This patient's chief complaints consist of right knee pain and stiffness following R TKA on 04/09/2021 leading to the following functional deficits: difficulty with mobility including bed mobility, transfers,  household and community ambulation, stairs; completing ADLs and IADLs such as cooking and cleaning..  Relevant past medical history and comorbidities include chronic back pain (does intermittently go down both legs), anxiety, sleep apnea, left hip fracture (2018 still bothers her, non surgical), bilateral foot pain, microcytic hypochromatic anemia, bilateral knee pain, obesity.  Patient denies hx of cancer, stroke, seizures, lung problem, major cardiac events, diabetes, unexplained weight loss, changes in bowel or bladder problems, new onset stumbling or dropping things, spinal surgery, left hip surgery.    Limitations Sitting;House hold activities;Lifting;Standing;Walking;Other (comment)   mobility including bed mobility, transfers, household and community ambulation, stairs; completing ADLs and IADLs such as cooking and cleaning.   Patient Stated Goals "get up out of physical therapy and walk right"    Currently in Pain? Yes    Pain Onset --               OBJECTIVE  FUNCTIONAL TESTING  - 6 Minute Walk Test: 1791 with RW. Improved buttock pain. - 5 Time Sit To Stand: 16 seconds from 18.5 inch plinth with B UE support and RW in front for safety.  - seated AAROM    TREATMENT:   Therapeutic exercise: to centralize symptoms and improve ROM, strength, muscular endurance, and activity tolerance required for successful completion of functional activities.  - ambulation around room with RW  for distance to assess 6MWT and prepare for further interventions. (See 6MWT above).  - sit <> stand 1x5 for speed with B UE support from 18 inch plinth plus attempt at standing with no UE support (unable).  - seated R knee flexion stretch planting R foot on floor and scooting body forward, 1x5 with 5 second hold (awkward).  - seated AAROM R knee flexion stretch with slider under foot and self assist from left foot. 1x20 with 5 second hold.  - seated R long arc quad with 1x10 with 5 second holds - Education  on HEP including handout   Pt required multimodal cuing for proper technique and to facilitate improved neuromuscular control, strength, range of motion, and functional ability resulting in improved performance and form.   HOME EXERCISE PROGRAM Access Code: [email or web address] URL: [email or web address] Date: 04/30/2021 Prepared by: Rosita Kea  Exercises Seated Knee Flexion Stretch - 3 x daily - 1 sets - 20 reps - 5 seconds hold Seated Long Arc Quad - 3 x daily - 3 sets - 10 reps - 5 seconds hold Supine Quad Set - 3 x daily - 2 sets - 10 reps - 5 seconds hold Supine Active Straight Leg Raise - 1 x daily - 3 sets - 5 reps   PT Education - 04/30/21 1536     Education Details Exercise purpose/form. Self management techniques.    Person(s) Educated Patient    Methods Explanation;Tactile cues;Demonstration;Verbal cues    Comprehension Verbalized understanding;Returned demonstration;Verbal cues required;Tactile cues required;Need further instruction              PT Short Term Goals - 04/30/21 1732       PT SHORT TERM GOAL #1   Title Be independent with initial home exercise program for self-management of symptoms.    Baseline Initial HEP provided at IE (04/28/2021);    Time 2    Period Weeks    Status Achieved    Target Date 05/12/21               PT Long Term Goals - 04/28/21 1721       PT LONG TERM GOAL #1   Title Be independent with a long-term home exercise program for self-management of symptoms.    Baseline Initial HEP provided at IE (04/28/2021);    Time 12    Period Weeks    Status New   TARGET DATE FOR ALL LONG TERM GOALS: 07/21/2021     PT LONG TERM GOAL #2   Title Demonstrate improved FOTO to equal or greater than 60 by visit #14 to demonstrate improvement in overall condition and self-reported functional ability.    Baseline 42 (04/28/2021);    Time 12    Period Weeks    Status New      PT LONG TERM GOAL #3   Title Paitent will demonstrate R  knee PROM equal or greater than 0-115 degrees to improve ability to step over objects, get up from a chair, and use the stairs.    Baseline 0-5-80 (04/28/2021);    Time 12    Period Weeks    Status New      PT LONG TERM GOAL #4   Title Patient will travel 1000 feet or more on 6 Minute Walk Test with LRAD to demonstrate improved community mobility.    Baseline to be tested visit 2 as appropriate (04/28/2021);    Time 12    Period Weeks    Status New  PT LONG TERM GOAL #5   Title Complete community, work and/or recreational activities without limitation due to current condition.    Baseline Functional Limitations: difficulty with mobility including bed mobility, transfers, household and community ambulation, stairs; completing ADLs and IADLs such as cooking and cleaning (04/28/2021);    Time 12    Period Weeks    Status New      Additional Long Term Goals   Additional Long Term Goals Yes      PT LONG TERM GOAL #6   Title Patient will complete 5 Time Sit to Stand Test in equal or less than 15 seconds from 18 inch plinth without use of B UE to demonstrate improved LE strength and power to improve household and community mobility and demonstrate decreased fall risk.    Baseline to be tested visit 2 as appropriate - currently needs B UE support to transfer sit <> stand (04/28/2021);    Time 12    Period Weeks    Status New                   Plan - 04/30/21 1731     Clinical Impression Statement Patient tolerated treatment well overall with mild increase in soreness over right knee. Left glute pain improved with ambulation. Patient met initial 6 Minute Walk Test with RW today but would like to be able to complete it with a less restrictive assistive device. She was able to complete 5TSTS in 16 seconds but had to use B UE support and continued to avoid R knee flexion. Patient's HEP was updated to include flexion stretching as well as further quad strengthening exercise. Plan to add  knee extension stretch next session. Patient was late due to remembering incorrect appointment time so treatment time was limited this session. Patient would benefit from continued management of limiting condition by skilled physical therapist to address remaining impairments and functional limitations to work towards stated goals and return to PLOF or maximal functional independence.    Personal Factors and Comorbidities Fitness;Time since onset of injury/illness/exacerbation;Comorbidity 3+;Past/Current Experience;Transportation    Comorbidities chronic back pain (does intermittently go down both legs), anxiety, sleep apnea, left hip fracture (2018 still bothers her, non surgical), bilateral foot pain, microcytic hypochromatic anemia, bilateral knee pain, obesity    Examination-Activity Limitations Bed Mobility;Lift;Stairs;Squat;Stand;Locomotion Level;Carry;Sit;Sleep;Dressing;Transfers    Examination-Participation Restrictions Cleaning;Shop;Laundry;Meal Prep;Community Activity;Interpersonal Relationship    Stability/Clinical Decision Making Stable/Uncomplicated    Rehab Potential Good    PT Frequency 2x / week    PT Duration 12 weeks    PT Treatment/Interventions ADLs/Self Care Home Management;Aquatic Therapy;Cryotherapy;Electrical Stimulation;Moist Heat;Gait training;Stair training;Functional mobility training;DME Instruction;Therapeutic activities;Therapeutic exercise;Balance training;Neuromuscular re-education;Manual techniques;Dry needling;Passive range of motion;Energy conservation;Patient/family education    PT Next Visit Plan quad strengthening, ROM, balance, functional strengthening    PT Home Exercise Plan Medbridge Access Code: [email or web address]    Consulted and Agree with Plan of Care Patient             Patient will benefit from skilled therapeutic intervention in order to improve the following deficits and impairments:  Abnormal gait, Decreased skin integrity, Increased fascial restricitons,  Impaired sensation, Improper body mechanics, Pain, Postural dysfunction, Increased muscle spasms, Decreased scar mobility, Decreased mobility, Decreased coordination, Decreased activity tolerance, Decreased endurance, Decreased range of motion, Decreased strength, Hypomobility, Impaired perceived functional ability, Decreased balance, Difficulty walking, Increased edema, Obesity  Visit Diagnosis: Chronic pain of right knee  Stiffness of right knee, not elsewhere classified  Muscle weakness (generalized)  Difficulty  in walking, not elsewhere classified ° ° ° ° °Problem List °Patient Active Problem List  ° Diagnosis Date Noted  ° Class 2 obesity 06/13/2018  ° Arthritis of right knee 01/31/2018  ° DDD (degenerative disc disease), lumbar 01/31/2018  ° Pain medication agreement signed 01/31/2018  ° Chronic back pain 01/02/2018  ° Pain in both feet 08/31/2017  ° Knee pain, bilateral 07/05/2017  ° Depression 06/07/2017  ° OSA (obstructive sleep apnea) 05/12/2017  ° Closed nondisplaced fracture of anterior wall of left acetabulum (HCC) 08/27/2016  ° Localized osteoarthritis of right knee 08/27/2016  ° Closed fracture of posterior wall of left acetabulum (HCC) 08/23/2016  ° HTN (hypertension) 06/21/2016  ° Allergic rhinitis 12/06/2013  ° Microcytic hypochromic anemia 12/06/2013  ° ° °Sara R. Snyder, PT, DPT °04/30/21, 5:33 PM ° °[place] °[facility] °[address] °[place], [place], [address] °Phone: [phone]   Fax:  [phone] ° °Name: Staley A Bougher °MRN: [id number] °Date of Birth: 07/28/1965 ° ° ° °

## 2021-05-05 ENCOUNTER — Ambulatory Visit: Payer: Medicare Other | Admitting: Physical Therapy

## 2021-05-05 ENCOUNTER — Encounter: Payer: Self-pay | Admitting: Physical Therapy

## 2021-05-05 ENCOUNTER — Other Ambulatory Visit: Payer: Self-pay

## 2021-05-05 DIAGNOSIS — R262 Difficulty in walking, not elsewhere classified: Secondary | ICD-10-CM

## 2021-05-05 DIAGNOSIS — M6281 Muscle weakness (generalized): Secondary | ICD-10-CM

## 2021-05-05 DIAGNOSIS — M25661 Stiffness of right knee, not elsewhere classified: Secondary | ICD-10-CM

## 2021-05-05 DIAGNOSIS — M25561 Pain in right knee: Secondary | ICD-10-CM | POA: Diagnosis not present

## 2021-05-05 DIAGNOSIS — G8929 Other chronic pain: Secondary | ICD-10-CM

## 2021-05-05 NOTE — Therapy (Signed)
[place] [facility] [address] [place], [place], [address] Phone: [phone]   Fax:  [phone]  Physical Therapy Treatment  Patient Details  Name: Ariel Davis MRN: [id number] Date of Birth: Jan 17, 1966 Referring Provider (PT): Kimberlee Nearinghristopher William Olcott, MD   Encounter Date: 05/05/2021   PT End of Session - 05/05/21 1352     Visit Number 3    Number of Visits 24    Date for PT Re-Evaluation 07/21/21    Authorization Type [employer] MEDICARE reporting period from 04/28/2021    Progress Note Due on Visit 10    PT Start Time 1345    PT Stop Time 1425    PT Time Calculation (min) 40 min    Activity Tolerance Patient limited by pain    [facility] tasks assessed/performed             Past Medical History:  Diagnosis Date   Anxiety    Chronic back pain    H/O degenerative disc disease    Hypertension    Sciatica     History reviewed. No pertinent surgical history.  There were no vitals filed for this visit.   Subjective Assessment - 05/05/21 1347     Subjective Patient reports her pain is elevated today to 7/10. She took the medication she can have, tylenol, at 11am. Pain is at the anterior R knee. She has doing her HEP but it is very painful.    Patient is accompained by: Family member    Pertinent History [name] who presents to outpatient physical therapy with a referral for medical diagnosis status post right total knee replacement. This patient's chief complaints consist of right knee pain and stiffness following R TKA on 04/09/2021 leading to the following functional deficits: difficulty with mobility including bed mobility, transfers, household and community ambulation, stairs; completing ADLs and IADLs such as cooking and cleaning..  Relevant past medical history and comorbidities include chronic back pain (does intermittently go down both legs), anxiety,  sleep apnea, left hip fracture (2018 still bothers her, non surgical), bilateral foot pain, microcytic hypochromatic anemia, bilateral knee pain, obesity.  Patient denies hx of cancer, stroke, seizures, lung problem, major cardiac events, diabetes, unexplained weight loss, changes in bowel or bladder problems, new onset stumbling or dropping things, spinal surgery, left hip surgery.    Limitations Sitting;House hold activities;Lifting;Standing;Walking;Other (comment)   mobility including bed mobility, transfers, household and community ambulation, stairs; completing ADLs and IADLs such as cooking and cleaning.   Patient Stated Goals "get up out of physical therapy and walk right"    Currently in Pain? Yes    Pain Score 7             TREATMENT:   Therapeutic exercise: to centralize symptoms and improve ROM, strength, muscular endurance, and activity tolerance required for successful completion of functional activities.  - NuStep level 4 using bilateral upper and lower extremities. Seat/handle setting 12/11. For improved extremity mobility, muscular endurance, and activity tolerance; and to induce the analgesic effect of aerobic exercise, stimulate improved joint nutrition, and prepare body structures and systems for following interventions. x 6  minutes. Average SPM = 85. (Goal 80 SPM or above). - sit <> stand from 23 inch surface, 2x10 while holding small pink theraball. (Did not do 3rd set due to pain) - seated AAROM R knee flexion stretch with slider under foot and self assist from left  foot. 1x10 with 5 second hold. (Very painful, ROM 97 degrees).  - seated R knee AROM flexion with foot on basket ball, 3x10 with close guarding by PT. Additional reps completed with ball posistioned differently. 1x10 on L to learn how exercise works.  - seated R quad set with self overpressure with foot on floor sitting at edge of plinth, 1x20 with 5 second hold.  - seated R long arc quad with 3x10 with 5 second  holds - supine short arc quad AROM over red bolster, 2x10 R side - hooklying R quad set with heel on bolster, 1x10 with 5 second hold (poor quad contraction).  - Hookling R ASLR (with bolster under ankle), 1x10 - supine PROM R hamstring stretch, 3x30 seconds - Education on HEP including handout    Pt required multimodal cuing for proper technique and to facilitate improved neuromuscular control, strength, range of motion, and functional ability resulting in improved performance and form.     HOME EXERCISE PROGRAM Access Code: [email or web address] URL: [email or web address] Date: 05/05/2021 Prepared by: Norton Blizzard  Exercises Seated Knee Flexion Stretch - 3 x daily - 1 sets - 20 reps - 5 seconds hold Seated Quad Set - 3 x daily - 1 sets - 20 reps - 5 second hold hold Seated Long Arc Quad - 3 x daily - 3 sets - 10 reps - 5 seconds hold Supine Quad Set - 3 x daily - 2 sets - 10 reps - 5 seconds hold Supine Active Straight Leg Raise - 1 x daily - 3 sets - 5 reps     PT Education - 05/05/21 1352     Education Details Exercise purpose/form. Self management techniques.    Person(s) Educated Patient    Methods Explanation;Demonstration;Tactile cues;Verbal cues    Comprehension Verbalized understanding;Returned demonstration;Verbal cues required;Tactile cues required;Need further instruction              PT Short Term Goals - 04/30/21 1732       PT SHORT TERM GOAL #1   Title Be independent with initial home exercise program for self-management of symptoms.    Baseline Initial HEP provided at IE (04/28/2021);    Time 2    Period Weeks    Status Achieved    Target Date 05/12/21               PT Long Term Goals - 04/28/21 1721       PT LONG TERM GOAL #1   Title Be independent with a long-term home exercise program for self-management of symptoms.    Baseline Initial HEP provided at IE (04/28/2021);    Time 12    Period Weeks    Status New   TARGET DATE FOR ALL LONG  TERM GOALS: 07/21/2021     PT LONG TERM GOAL #2   Title Demonstrate improved FOTO to equal or greater than 60 by visit #14 to demonstrate improvement in overall condition and self-reported functional ability.    Baseline 42 (04/28/2021);    Time 12    Period Weeks    Status New      PT LONG TERM GOAL #3   Title Paitent will demonstrate R knee PROM equal or greater than 0-115 degrees to improve ability to step over objects, get up from a chair, and use the stairs.    Baseline 0-5-80 (04/28/2021);    Time 12    Period Weeks    Status New      PT LONG TERM GOAL #  4   Title Patient will travel 1000 feet or more on 6 Minute Walk Test with LRAD to demonstrate improved community mobility.    Baseline to be tested visit 2 as appropriate (04/28/2021);    Time 12    Period Weeks    Status New      PT LONG TERM GOAL #5   Title Complete community, work and/or recreational activities without limitation due to current condition.    Baseline Functional Limitations: difficulty with mobility including bed mobility, transfers, household and community ambulation, stairs; completing ADLs and IADLs such as cooking and cleaning (04/28/2021);    Time 12    Period Weeks    Status New      Additional Long Term Goals   Additional Long Term Goals Yes      PT LONG TERM GOAL #6   Title Patient will complete 5 Time Sit to Stand Test in equal or less than 15 seconds from 18 inch plinth without use of B UE to demonstrate improved LE strength and power to improve household and community mobility and demonstrate decreased fall risk.    Baseline to be tested visit 2 as appropriate - currently needs B UE support to transfer sit <> stand (04/28/2021);    Time 12    Period Weeks    Status New                   Plan - 05/05/21 1534     Clinical Impression Statement Patient tolerated treatment with some difficulty due to pain at the right knee. Session continued to focus on improving knee ROM and quad  activation/strength. Patient continues to have high level of pain with knee flexion but did report mildly decreased pain by end of session. She has poor quad contraction and is unable to perform effective quad set or perform active straight leg raise without significant lag. She would benefit from NMES next session to facilitate quad contraction. Patient would benefit from continued management of limiting condition by skilled physical therapist to address remaining impairments and functional limitations to work towards stated goals and return to PLOF or maximal functional independence.    Personal Factors and Comorbidities Fitness;Time since onset of injury/illness/exacerbation;Comorbidity 3+;Past/Current Experience;Transportation    Comorbidities chronic back pain (does intermittently go down both legs), anxiety, sleep apnea, left hip fracture (2018 still bothers her, non surgical), bilateral foot pain, microcytic hypochromatic anemia, bilateral knee pain, obesity    Examination-Activity Limitations Bed Mobility;Lift;Stairs;Squat;Stand;Locomotion Level;Carry;Sit;Sleep;Dressing;Transfers    Examination-Participation Restrictions Cleaning;Shop;Laundry;Meal Prep;Community Activity;Interpersonal Relationship    Stability/Clinical Decision Making Stable/Uncomplicated    Rehab Potential Good    PT Frequency 2x / week    PT Duration 12 weeks    PT Treatment/Interventions ADLs/Self Care Home Management;Aquatic Therapy;Cryotherapy;Electrical Stimulation;Moist Heat;Gait training;Stair training;Functional mobility training;DME Instruction;Therapeutic activities;Therapeutic exercise;Balance training;Neuromuscular re-education;Manual techniques;Dry needling;Passive range of motion;Energy conservation;Patient/family education    PT Next Visit Plan quad strengthening, ROM, balance, functional strengthening    PT Home Exercise Plan Medbridge Access Code: [email or web address]    Consulted and Agree with Plan of Care Patient              Patient will benefit from skilled therapeutic intervention in order to improve the following deficits and impairments:  Abnormal gait, Decreased skin integrity, Increased fascial restricitons, Impaired sensation, Improper body mechanics, Pain, Postural dysfunction, Increased muscle spasms, Decreased scar mobility, Decreased mobility, Decreased coordination, Decreased activity tolerance, Decreased endurance, Decreased range of motion, Decreased strength, Hypomobility, Impaired perceived functional ability, Decreased balance,  Difficulty walking, Increased edema, Obesity  Visit Diagnosis: Chronic pain of right knee  Stiffness of right knee, not elsewhere classified  Muscle weakness (generalized)  Difficulty in walking, not elsewhere classified     Problem List Patient Active Problem List   Diagnosis Date Noted   Class 2 obesity 06/13/2018   Arthritis of right knee 01/31/2018   DDD (degenerative disc disease), lumbar 01/31/2018   Pain medication agreement signed 01/31/2018   Chronic back pain 01/02/2018   Pain in both feet 08/31/2017   Knee pain, bilateral 07/05/2017   Depression 06/07/2017   OSA (obstructive sleep apnea) 05/12/2017   Closed nondisplaced fracture of anterior wall of left acetabulum (HCC) 08/27/2016   Localized osteoarthritis of right knee 08/27/2016   Closed fracture of posterior wall of left acetabulum (HCC) 08/23/2016   HTN (hypertension) 06/21/2016   Allergic rhinitis 12/06/2013   Microcytic hypochromic anemia 12/06/2013    [place] Dec R. Ilsa Iha, PT, DPT 05/05/21, 3:34 PM   [place] [facility] [address] [place], [address] Phone: [phone]   Fax:  [phone]  Name: Ariel Davis MRN: [id number] Date of Birth: [birth year]

## 2021-05-07 ENCOUNTER — Encounter: Payer: Self-pay | Admitting: Physical Therapy

## 2021-05-07 ENCOUNTER — Ambulatory Visit: Payer: Medicare Other | Attending: Orthopedic Surgery | Admitting: Physical Therapy

## 2021-05-07 ENCOUNTER — Other Ambulatory Visit: Payer: Self-pay

## 2021-05-07 ENCOUNTER — Encounter: Payer: Medicare Other | Admitting: Physical Therapy

## 2021-05-07 DIAGNOSIS — M6281 Muscle weakness (generalized): Secondary | ICD-10-CM | POA: Diagnosis present

## 2021-05-07 DIAGNOSIS — M25561 Pain in right knee: Secondary | ICD-10-CM | POA: Insufficient documentation

## 2021-05-07 DIAGNOSIS — M25661 Stiffness of right knee, not elsewhere classified: Secondary | ICD-10-CM | POA: Diagnosis present

## 2021-05-07 DIAGNOSIS — R262 Difficulty in walking, not elsewhere classified: Secondary | ICD-10-CM | POA: Insufficient documentation

## 2021-05-07 DIAGNOSIS — G8929 Other chronic pain: Secondary | ICD-10-CM | POA: Insufficient documentation

## 2021-05-07 NOTE — Therapy (Signed)
[place] [facility] [address], [place], [address] Phone: [phone]   Fax:  [phone]  Physical Therapy Treatment  Patient Details  Name: Ariel Davis MRN: [id number] Date of Birth: May 31, 1965 Referring Provider (PT): Marlana Latus, MD   Encounter Date: 05/07/2021   PT End of Session - 05/07/21 1315     Visit Number 4    Number of Visits 24    Date for PT Re-Evaluation 07/21/21    Authorization Type [employer]ICARE reporting period from 04/28/2021    Progress Note Due on Visit 10    PT Start Time 1309    PT Stop Time 1340    PT Time Calculation (min) 31 min    Activity Tolerance Patient limited by pain    Behavior During Therapy [facility] for tasks assessed/performed             Past Medical History:  Diagnosis Date   Anxiety    Chronic back pain    H/O degenerative disc disease    Hypertension    Sciatica     History reviewed. No pertinent surgical history.  There were no vitals filed for this visit.   Subjective Assessment - 05/07/21 1311     Subjective Pateint reports her back pain has worsened since last PT session. It is hurting in her low back both legs to the heels on both legs in the back and the front. She rates her back pain 9/10 today. Patient also rates her right knee pain as 9/10. She arrives with RW and is 9 min late. She states she is late due to her daughter having trouble getting her here while trying to work.  She states she felt pretty good after her last PT session.  She is unsure what makes her back feel better or worse except prolonged sititng and prolonged standing.    Patient is accompained by: Family member    Pertinent History [name] who presents to outpatient physical therapy with a referral for medical diagnosis status post right total knee replacement. This patient's chief complaints consist of right knee pain and stiffness  following R TKA on 04/09/2021 leading to the following functional deficits: difficulty with mobility including bed mobility, transfers, household and community ambulation, stairs; completing ADLs and IADLs such as cooking and cleaning..  Relevant past medical history and comorbidities include chronic back pain (does intermittently go down both legs), anxiety, sleep apnea, left hip fracture (2018 still bothers her, non surgical), bilateral foot pain, microcytic hypochromatic anemia, bilateral knee pain, obesity.  Patient denies hx of cancer, stroke, seizures, lung problem, major cardiac events, diabetes, unexplained weight loss, changes in bowel or bladder problems, new onset stumbling or dropping things, spinal surgery, left hip surgery.    Limitations Sitting;House hold activities;Lifting;Standing;Walking;Other (comment)   mobility including bed mobility, transfers, household and community ambulation, stairs; completing ADLs and IADLs such as cooking and cleaning.   Patient Stated Goals "get up out of physical therapy and walk right"    Currently in Pain? Yes    Pain Score 9               TREATMENT:    Therapeutic exercise: to centralize symptoms and improve ROM, strength, muscular endurance, and activity tolerance required for successful completion of functional activities.  - NuStep level 5 using bilateral upper and lower extremities. Seat/handle setting 12/11. For improved extremity mobility, muscular endurance, and activity tolerance; and  to induce the analgesic effect of aerobic exercise, stimulate improved joint nutrition, and prepare body structures and systems for following interventions. x 5  minutes. Average SPM = 57. (Goal 80 SPM or above, several stops due to pain). - supine <> sit min A for R LE - hooklying R short arc quad with 5 second hold while utilizing NMES/Russian estim to right quad with patient in hooklying with bolster under R knee. Duty cycle 50%, cycle time 5/5, CC, Burst  Frequency 50 bps, Anti-Fatigue off, Ramp 5 seconds, up to 55 mA (intensity to patient tolerance without palpable quad contraction).  Cold pack under low back for comfort. 10 min. Pt response: had difficulty tolerating intensity of estim at first but relaxed with repetition. She had difficulty with exercise due to low back pain affecting low back and both legs as well.    Pt required multimodal cuing for proper technique and to facilitate improved neuromuscular control, strength, range of motion, and functional ability resulting in improved performance and form.     HOME EXERCISE PROGRAM Access Code: [email or web address] URL: [email or web address] Date: 05/05/2021 Prepared by: Rosita Kea   Exercises Seated Knee Flexion Stretch - 3 x daily - 1 sets - 20 reps - 5 seconds hold Seated Quad Set - 3 x daily - 1 sets - 20 reps - 5 second hold hold Seated Long Arc Quad - 3 x daily - 3 sets - 10 reps - 5 seconds hold Supine Quad Set - 3 x daily - 2 sets - 10 reps - 5 seconds hold Supine Active Straight Leg Raise - 1 x daily - 3 sets - 5 reps      PT Education - 05/07/21 1315     Education Details Exercise purpose/form. Self management techniques. Reviewed cancelation/no-show policy with patient and confirmed patient has correct phone number for clinic; patient verbalized understanding    Person(s) Educated Patient    Methods Explanation;Demonstration;Tactile cues    Comprehension Verbalized understanding;Returned demonstration;Verbal cues required;Tactile cues required;Need further instruction              PT Short Term Goals - 04/30/21 1732       PT SHORT TERM GOAL #1   Title Be independent with initial home exercise program for self-management of symptoms.    Baseline Initial HEP provided at IE (04/28/2021);    Time 2    Period Weeks    Status Achieved    Target Date 05/12/21               PT Long Term Goals - 04/28/21 1721       PT LONG TERM GOAL #1   Title Be  independent with a long-term home exercise program for self-management of symptoms.    Baseline Initial HEP provided at IE (04/28/2021);    Time 12    Period Weeks    Status New   TARGET DATE FOR ALL LONG TERM GOALS: 07/21/2021     PT LONG TERM GOAL #2   Title Demonstrate improved FOTO to equal or greater than 60 by visit #14 to demonstrate improvement in overall condition and self-reported functional ability.    Baseline 42 (04/28/2021);    Time 12    Period Weeks    Status New      PT LONG TERM GOAL #3   Title Paitent will demonstrate R knee PROM equal or greater than 0-115 degrees to improve ability to step over objects, get up from a chair, and use the stairs.  Baseline 0-5-80 (04/28/2021);    Time 12    Period Weeks    Status New      PT LONG TERM GOAL #4   Title Patient will travel 1000 feet or more on 6 Minute Walk Test with LRAD to demonstrate improved community mobility.    Baseline to be tested visit 2 as appropriate (04/28/2021);    Time 12    Period Weeks    Status New      PT LONG TERM GOAL #5   Title Complete community, work and/or recreational activities without limitation due to current condition.    Baseline Functional Limitations: difficulty with mobility including bed mobility, transfers, household and community ambulation, stairs; completing ADLs and IADLs such as cooking and cleaning (04/28/2021);    Time 12    Period Weeks    Status New      Additional Long Term Goals   Additional Long Term Goals Yes      PT LONG TERM GOAL #6   Title Patient will complete 5 Time Sit to Stand Test in equal or less than 15 seconds from 18 inch plinth without use of B UE to demonstrate improved LE strength and power to improve household and community mobility and demonstrate decreased fall risk.    Baseline to be tested visit 2 as appropriate - currently needs B UE support to transfer sit <> stand (04/28/2021);    Time 12    Period Weeks    Status New                    Plan - 05/08/21 1231     Clinical Impression Statement Patient arrives with pain elevated to 9/10 in low back radiating to B LE to heels and right knee. Patient had difficulty completing nu-step exercise and transferring supine <> sit due to back, leg, and knee pain. Patient's R quad continues with poor activation so NMES/Russian estim was applied for short arc quads to improve activation. Patient arrived late and required increased time for transfers and to complete nustep. Patient appeared to feel slightly better by end of session. Patient would benefit from continued management of limiting condition by skilled physical therapist to address remaining impairments and functional limitations to work towards stated goals and return to PLOF or maximal functional independence.    Personal Factors and Comorbidities Fitness;Time since onset of injury/illness/exacerbation;Comorbidity 3+;Past/Current Experience;Transportation    Comorbidities chronic back pain (does intermittently go down both legs), anxiety, sleep apnea, left hip fracture (2018 still bothers her, non surgical), bilateral foot pain, microcytic hypochromatic anemia, bilateral knee pain, obesity    Examination-Activity Limitations Bed Mobility;Lift;Stairs;Squat;Stand;Locomotion Level;Carry;Sit;Sleep;Dressing;Transfers    Examination-Participation Restrictions Cleaning;Shop;Laundry;Meal Prep;Community Activity;Interpersonal Relationship    Stability/Clinical Decision Making Stable/Uncomplicated    Rehab Potential Good    PT Frequency 2x / week    PT Duration 12 weeks    PT Treatment/Interventions ADLs/Self Care Home Management;Aquatic Therapy;Cryotherapy;Electrical Stimulation;Moist Heat;Gait training;Stair training;Functional mobility training;DME Instruction;Therapeutic activities;Therapeutic exercise;Balance training;Neuromuscular re-education;Manual techniques;Dry needling;Passive range of motion;Energy  conservation;Patient/family education    PT Next Visit Plan quad strengthening, ROM, balance, functional strengthening    PT Home Exercise Plan Medbridge Access Code: [email or web address]    Consulted and Agree with Plan of Care Patient             Patient will benefit from skilled therapeutic intervention in order to improve the following deficits and impairments:  Abnormal gait, Decreased skin integrity, Increased fascial restricitons, Impaired sensation, Improper body mechanics, Pain, Postural dysfunction,  Increased muscle spasms, Decreased scar mobility, Decreased mobility, Decreased coordination, Decreased activity tolerance, Decreased endurance, Decreased range of motion, Decreased strength, Hypomobility, Impaired perceived functional ability, Decreased balance, Difficulty walking, Increased edema, Obesity  Visit Diagnosis: Chronic pain of right knee  Stiffness of right knee, not elsewhere classified  Muscle weakness (generalized)  Difficulty in walking, not elsewhere classified     Problem List Patient Active Problem List   Diagnosis Date Noted   Class 2 obesity 06/13/2018   Arthritis of right knee 01/31/2018   DDD (degenerative disc disease), lumbar 01/31/2018   Pain medication agreement signed 01/31/2018   Chronic back pain 01/02/2018   Pain in both feet 08/31/2017   Knee pain, bilateral 07/05/2017   Depression 06/07/2017   OSA (obstructive sleep apnea) 05/12/2017   Closed nondisplaced fracture of anterior wall of left acetabulum ([place]) 08/27/2016   Localized osteoarthritis of right knee 08/27/2016   Closed fracture of posterior wall of left acetabulum ([place]) 08/23/2016   HTN (hypertension) 06/21/2016   Allergic rhinitis 12/06/2013   Microcytic hypochromic anemia 12/06/2013    Clarise Cruz R. Graylon Good, PT, DPT 05/08/21, 12:33 PM   [place] [facility] [address], [place], [address] Phone: [phone]   Fax:   [phone]  Name: Ariel Davis MRN: [id number] Date of Birth: 07/31/1965

## 2021-05-12 ENCOUNTER — Ambulatory Visit: Payer: Medicare Other | Admitting: Physical Therapy

## 2021-05-12 ENCOUNTER — Encounter: Payer: Self-pay | Admitting: Physical Therapy

## 2021-05-12 ENCOUNTER — Other Ambulatory Visit: Payer: Self-pay

## 2021-05-12 DIAGNOSIS — M25561 Pain in right knee: Secondary | ICD-10-CM | POA: Diagnosis not present

## 2021-05-12 DIAGNOSIS — G8929 Other chronic pain: Secondary | ICD-10-CM

## 2021-05-12 DIAGNOSIS — M6281 Muscle weakness (generalized): Secondary | ICD-10-CM

## 2021-05-12 DIAGNOSIS — R262 Difficulty in walking, not elsewhere classified: Secondary | ICD-10-CM

## 2021-05-12 DIAGNOSIS — M25661 Stiffness of right knee, not elsewhere classified: Secondary | ICD-10-CM

## 2021-05-12 NOTE — Therapy (Signed)
[place] [facility] [address] , [place], [address] Phone: [phone]   Fax:  [phone]  Physical Therapy Treatment  Patient Details  Name: Ariel Davis MRN: [id number] Date of Birth: [birth year] Referring Provider (PT): Kimberlee Nearinghristopher William Olcott, MD   Encounter Date: 05/12/2021   PT End of Session - 05/12/21 1412     Visit Number 5    Number of Visits 24    Date for PT Re-Evaluation 07/21/21    Authorization Type [employer] MEDICARE reporting period from 04/28/2021    Progress Note Due on Visit 10    PT Start Time 1355    PT Stop Time 1425    PT Time Calculation (min) 30 min    Activity Tolerance Patient limited by pain;Patient tolerated treatment well    [facility] for tasks assessed/performed             Past Medical History:  Diagnosis Date   Anxiety    Chronic back pain    H/O degenerative disc disease    Hypertension    Sciatica     History reviewed. No pertinent surgical history.  There were no vitals filed for this visit.   Subjective Assessment - 05/12/21 1358     Subjective Patient reports R anterior knee pain  and central low back pain of 6/10 upon arrival. She arrives 9 min late on her RW. She states regarding her pain "it didn't get no better" and "I went home and went to sleep" after last PT session. She states her back has improved some since last PT session. She states her HEP is going "pretty good" at home and she is doing them. She took her tylenol for today.    Patient is accompained by: Family member    Pertinent History [name] who presents to outpatient physical therapy with a referral for medical diagnosis status post right total knee replacement. This patient's chief complaints consist of right knee pain and stiffness following R TKA on 04/09/2021 leading to the following functional deficits: difficulty with mobility including bed  mobility, transfers, household and community ambulation, stairs; completing ADLs and IADLs such as cooking and cleaning..  Relevant past medical history and comorbidities include chronic back pain (does intermittently go down both legs), anxiety, sleep apnea, left hip fracture (2018 still bothers her, non surgical), bilateral foot pain, microcytic hypochromatic anemia, bilateral knee pain, obesity.  Patient denies hx of cancer, stroke, seizures, lung problem, major cardiac events, diabetes, unexplained weight loss, changes in bowel or bladder problems, new onset stumbling or dropping things, spinal surgery, left hip surgery.    Limitations Sitting;House hold activities;Lifting;Standing;Walking;Other (comment)   mobility including bed mobility, transfers, household and community ambulation, stairs; completing ADLs and IADLs such as cooking and cleaning.   Patient Stated Goals "get up out of physical therapy and walk right"    Currently in Pain? Yes    Pain Score 6               OBJECTIVE  SELF-REPORTED FUNCTION FOTO score: 46/100 (knee questionnaire)  AROM R knee Extension (with NMES/Russion): lacking 15 degrees  PROM R knee Flexion: 95 degrees   TREATMENT:   Date of surgery 04/09/2021: R TKA Time since surgery: 4 weeks, 5 days (05/12/2021);    Therapeutic exercise: to centralize symptoms and improve ROM, strength, muscular endurance, and activity tolerance required for successful completion of functional activities.  - NuStep level  5 using bilateral lower extremities. Seat/handle setting 12/11. For improved extremity mobility, muscular endurance, and activity tolerance; and to induce the analgesic effect of aerobic exercise, stimulate improved joint nutrition, and prepare body structures and systems for following interventions. x 5  minutes. Average SPM = 77.  - hooklying R short arc quad over 6 inch foam roll for 5 min, then R ASLR, both with 5 second hold while utilizing NMES/Russian  estim to right quad with patient in hooklying with bolster under R knee. Duty cycle 50%, cycle time 5/5, CC, Burst Frequency 50 bps, Anti-Fatigue off, Ramp 5 seconds, up to 61 mA (intensity to patient tolerance without palpable quad contraction).  Cold pack under low back for comfort. 10 min. Pt response: had difficulty tolerating intensity of estim at first but relaxed with repetition.   Manual therapy: to reduce pain and tissue tension, improve range of motion, neuromodulation, in order to promote improved ability to complete functional activities. HOOKLYING/SUPINE - STM to right quad - R patellar mobilizations grade II-III all directions - PROM R knee flexion 1x10 with 10 second hold intermixed with oscillations and contract-relax techniques to modulate pain and allow relaxation.    Pt required multimodal cuing for proper technique and to facilitate improved neuromuscular control, strength, range of motion, and functional ability resulting in improved performance and form.     HOME EXERCISE PROGRAM Access Code: [email or web address] URL: [email or web address] Date: 05/05/2021 Prepared by: Norton Blizzard   Exercises Seated Knee Flexion Stretch - 3 x daily - 1 sets - 20 reps - 5 seconds hold Seated Quad Set - 3 x daily - 1 sets - 20 reps - 5 second hold hold Seated Long Arc Quad - 3 x daily - 3 sets - 10 reps - 5 seconds hold Supine Quad Set - 3 x daily - 2 sets - 10 reps - 5 seconds hold Supine Active Straight Leg Raise - 1 x daily - 3 sets - 5 reps    PT Education - 05/12/21 1400     Education Details Exercise purpose/form. Self management techniques.    Person(s) Educated Patient    Methods Explanation;Demonstration;Tactile cues;Verbal cues    Comprehension Verbalized understanding;Returned demonstration;Verbal cues required;Tactile cues required;Need further instruction              PT Short Term Goals - 04/30/21 1732       PT SHORT TERM GOAL #1   Title Be independent with  initial home exercise program for self-management of symptoms.    Baseline Initial HEP provided at IE (04/28/2021);    Time 2    Period Weeks    Status Achieved    Target Date 05/12/21               PT Long Term Goals - 04/28/21 1721       PT LONG TERM GOAL #1   Title Be independent with a long-term home exercise program for self-management of symptoms.    Baseline Initial HEP provided at IE (04/28/2021);    Time 12    Period Weeks    Status New   TARGET DATE FOR ALL LONG TERM GOALS: 07/21/2021     PT LONG TERM GOAL #2   Title Demonstrate improved FOTO to equal or greater than 60 by visit #14 to demonstrate improvement in overall condition and self-reported functional ability.    Baseline 42 (04/28/2021);    Time 12    Period Weeks    Status New  PT LONG TERM GOAL #3   Title Paitent will demonstrate R knee PROM equal or greater than 0-115 degrees to improve ability to step over objects, get up from a chair, and use the stairs.    Baseline 0-5-80 (04/28/2021);    Time 12    Period Weeks    Status New      PT LONG TERM GOAL #4   Title Patient will travel 1000 feet or more on 6 Minute Walk Test with LRAD to demonstrate improved community mobility.    Baseline to be tested visit 2 as appropriate (04/28/2021);    Time 12    Period Weeks    Status New      PT LONG TERM GOAL #5   Title Complete community, work and/or recreational activities without limitation due to current condition.    Baseline Functional Limitations: difficulty with mobility including bed mobility, transfers, household and community ambulation, stairs; completing ADLs and IADLs such as cooking and cleaning (04/28/2021);    Time 12    Period Weeks    Status New      Additional Long Term Goals   Additional Long Term Goals Yes      PT LONG TERM GOAL #6   Title Patient will complete 5 Time Sit to Stand Test in equal or less than 15 seconds from 18 inch plinth without use of B UE to demonstrate improved LE  strength and power to improve household and community mobility and demonstrate decreased fall risk.    Baseline to be tested visit 2 as appropriate - currently needs B UE support to transfer sit <> stand (04/28/2021);    Time 12    Period Weeks    Status New                   Plan - 05/13/21 1502     Clinical Impression Statement Patient arrives 9  min late which lead to limited treatment time this session. Continued with NMES/Russian estim to improve R quad activation. Patient showed signs of improvement with now lacking 15 degrees active R knee extension. Patient with difficulty tolerating interventions due to pain but is unable to take pain medication stronger than tylenol (which she already took). Patient was able to reach 95 degrees PROM knee flexion and was limited by pain, guarding, and increasingly stiff end feel. Patient would benefit from continued management of limiting condition by skilled physical therapist to address remaining impairments and functional limitations to work towards stated goals and return to PLOF or maximal functional independence.    Personal Factors and Comorbidities Fitness;Time since onset of injury/illness/exacerbation;Comorbidity 3+;Past/Current Experience;Transportation    Comorbidities chronic back pain (does intermittently go down both legs), anxiety, sleep apnea, left hip fracture (2018 still bothers her, non surgical), bilateral foot pain, microcytic hypochromatic anemia, bilateral knee pain, obesity    Examination-Activity Limitations Bed Mobility;Lift;Stairs;Squat;Stand;Locomotion Level;Carry;Sit;Sleep;Dressing;Transfers    Examination-Participation Restrictions Cleaning;Shop;Laundry;Meal Prep;Community Activity;Interpersonal Relationship    Stability/Clinical Decision Making Stable/Uncomplicated    Rehab Potential Good    PT Frequency 2x / week    PT Duration 12 weeks    PT Treatment/Interventions ADLs/Self Care Home Management;Aquatic  Therapy;Cryotherapy;Electrical Stimulation;Moist Heat;Gait training;Stair training;Functional mobility training;DME Instruction;Therapeutic activities;Therapeutic exercise;Balance training;Neuromuscular re-education;Manual techniques;Dry needling;Passive range of motion;Energy conservation;Patient/family education    PT Next Visit Plan quad strengthening, ROM, balance, functional strengthening    PT Home Exercise Plan Medbridge Access Code: [email or web address]    Consulted and Agree with Plan of Care Patient  Patient will benefit from skilled therapeutic intervention in order to improve the following deficits and impairments:  Abnormal gait, Decreased skin integrity, Increased fascial restricitons, Impaired sensation, Improper body mechanics, Pain, Postural dysfunction, Increased muscle spasms, Decreased scar mobility, Decreased mobility, Decreased coordination, Decreased activity tolerance, Decreased endurance, Decreased range of motion, Decreased strength, Hypomobility, Impaired perceived functional ability, Decreased balance, Difficulty walking, Increased edema, Obesity  Visit Diagnosis: Chronic pain of right knee  Stiffness of right knee, not elsewhere classified  Muscle weakness (generalized)  Difficulty in walking, not elsewhere classified     Problem List Patient Active Problem List   Diagnosis Date Noted   Class 2 obesity 06/13/2018   Arthritis of right knee 01/31/2018   DDD (degenerative disc disease), lumbar 01/31/2018   Pain medication agreement signed 01/31/2018   Chronic back pain 01/02/2018   Pain in both feet 08/31/2017   Knee pain, bilateral 07/05/2017   Depression 06/07/2017   OSA (obstructive sleep apnea) 05/12/2017   Closed nondisplaced fracture of anterior wall of left acetabulum ([facility]) 08/27/2016   Localized osteoarthritis of right knee 08/27/2016   Closed fracture of posterior wall of left acetabulum ([facility]) 08/23/2016   HTN (hypertension) 06/21/2016    Allergic rhinitis 12/06/2013   Microcytic hypochromic anemia 12/06/2013   [place] Dec R. Ilsa Iha, PT, DPT 05/13/21, 3:03 PM   [place] [facility] [address] [place], [address] Phone: [phone]   Fax:  [phone]  Name: MARCENA DIAS MRN: [id number] Date of Birth: May 25, 1965

## 2021-05-14 ENCOUNTER — Ambulatory Visit: Payer: Medicare Other | Admitting: Physical Therapy

## 2021-05-14 ENCOUNTER — Encounter: Payer: Self-pay | Admitting: Physical Therapy

## 2021-05-14 ENCOUNTER — Other Ambulatory Visit: Payer: Self-pay

## 2021-05-14 DIAGNOSIS — M6281 Muscle weakness (generalized): Secondary | ICD-10-CM

## 2021-05-14 DIAGNOSIS — G8929 Other chronic pain: Secondary | ICD-10-CM

## 2021-05-14 DIAGNOSIS — M25561 Pain in right knee: Secondary | ICD-10-CM | POA: Diagnosis not present

## 2021-05-14 DIAGNOSIS — M25661 Stiffness of right knee, not elsewhere classified: Secondary | ICD-10-CM

## 2021-05-14 DIAGNOSIS — R262 Difficulty in walking, not elsewhere classified: Secondary | ICD-10-CM

## 2021-05-14 NOTE — Therapy (Signed)
[facility] MEDIC[facility] [address] [place], [address] Phone: [phone]   Fax:  [phone]  Physical Therapy Treatment  Patient Details  Name: Ariel Davis MRN: [id number] Date of Birth: 04-19-1965 Referring Provider (PT): Kimberlee Nearing, MD   Encounter Date: 05/14/2021   PT End of Session - 05/14/21 1137     Visit Number 6    Number of Visits 24    Date for PT Re-Evaluation 07/21/21    Authorization Type [employer]IC[employer] reporting period from 04/28/2021    Progress Note Due on Visit 10    PT Start Time 1117    PT Stop Time 1157    PT Time Calculation (min) 40 min    Activity Tolerance Patient limited by pain    Behavior During Therapy [facility] for tasks assessed/performed             Past Medical History:  Diagnosis Date   Anxiety    Chronic back pain    H/O degenerative disc disease    Hypertension    Sciatica     History reviewed. No pertinent surgical history.  There were no vitals filed for this visit.   Subjective Assessment - 05/14/21 1119     Subjective Patient reports her back is 8/10 pain in central low back and knee is not as bad. She rates her right knee pain 7/10. She could hardly walk at all due to her back yesterday but today is better. She states her knee felt okay after last PT session. She continues to do her HEP. She states she saw her doctor for her back pain yesterday at the pain clinic and there was not a lot they could do except increase [place] to 300 mcg. She will pick it up tomorrow and it needs time to build up in her system before she can expect improvement.    Patient is accompained by: Family member    Pertinent History [name] who presents to outpatient physical therapy with a referral for medical diagnosis status post right total knee replacement. This patient's chief complaints consist of right knee pain and stiffness following R  TKA on 04/09/2021 leading to the following functional deficits: difficulty with mobility including bed mobility, transfers, household and community ambulation, stairs; completing ADLs and IADLs such as cooking and cleaning..  Relevant past medical history and comorbidities include chronic back pain (does intermittently go down both legs), anxiety, sleep apnea, left hip fracture (2018 still bothers her, non surgical), bilateral foot pain, microcytic hypochromatic anemia, bilateral knee pain, obesity.  Patient denies hx of cancer, stroke, seizures, lung problem, major cardiac events, diabetes, unexplained weight loss, changes in bowel or bladder problems, new onset stumbling or dropping things, spinal surgery, left hip surgery.    Limitations Sitting;House hold activities;Lifting;Standing;Walking;Other (comment)   mobility including bed mobility, transfers, household and community ambulation, stairs; completing ADLs and IADLs such as cooking and cleaning.   Patient Stated Goals "get up out of physical therapy and walk right"    Currently in Pain? Yes    Pain Score 7                    OBJECTIVE   AROM R knee Extension (with NMES/Russion): lacking 15 degrees in SAQ, 10 degrees with ASLR from heel elevated position.    PROM R knee Flexion: 100 degrees    TREATMENT:    Date of surgery 04/09/2021: R  TKA Time since surgery: 5 weeks  (05/14/2021);    Therapeutic exercise: to centralize symptoms and improve ROM, strength, muscular endurance, and activity tolerance required for successful completion of functional activities.  - NuStep level 6 using bilateral lower extremities. Seat/handle setting 12/11. For improved extremity mobility, muscular endurance, and activity tolerance; and to induce the analgesic effect of aerobic exercise, stimulate improved joint nutrition, and prepare body structures and systems for following interventions. x 5  minutes. Average SPM = 63. Added pillow behind back for  comfort.  - hooklying R short arc quad over 6 inch foam roll for 5 min, then R ASLR, both with 5 second hold while utilizing NMES/Russian estim to right quad with patient in hooklying with bolster under R knee. Duty cycle 50%, cycle time 5/5, CC, Burst Frequency 50 bps, Anti-Fatigue off, Ramp 5 seconds, up to 65 mA (intensity to patient tolerance without palpable quad contraction).  Cold pack under low back for comfort. 10 min. Pt response: had difficulty tolerating intensity of estim at first but relaxed with repetition.  - hooklying R heel slide, 1x7 - hooklying R heel slide on small yellow theraball with cuing to dig heel into ball to improve hamstring activation for reciprocal inhibition of R quads. 1x10   Manual therapy: to reduce pain and tissue tension, improve range of motion, neuromodulation, in order to promote improved ability to complete functional activities. HOOKLYING/SUPINE - STM to right quad - supine with half foam roll behind R knee. R patellar mobilizations grade II-III all directions - Hooklying R tibiofemoral AP joint mobs, grade III-IV, 2x30 seconds in posterior drawer position  - hooklying R knee flexion overpressure mobs with PT forearm behind knee braced on contralateral knee with PA pressure from forearm, 2x30 seconds, grades III-IV.  - PROM R knee flexion 1x10 with 10 second hold intermixed with oscillations and contract-relax techniques to modulate pain and allow relaxation.    Pt required multimodal cuing for proper technique and to facilitate improved neuromuscular control, strength, range of motion, and functional ability resulting in improved performance and form.     HOME EXERCISE PROGRAM Access Code: [email or web address] URL: [email or web address] Date: 05/05/2021 Prepared by: Norton BlizzardSara Tajon Moring   Exercises Seated Knee Flexion Stretch - 3 x daily - 1 sets - 20 reps - 5 seconds hold Seated Quad Set - 3 x daily - 1 sets - 20 reps - 5 second hold hold Seated Long  Arc Quad - 3 x daily - 3 sets - 10 reps - 5 seconds hold Supine Quad Set - 3 x daily - 2 sets - 10 reps - 5 seconds hold Supine Active Straight Leg Raise - 1 x daily - 3 sets - 5 reps    PT Education - 05/14/21 1137     Education Details Exercise purpose/form. Self management techniques.    Person(s) Educated Patient    Methods Explanation;Demonstration;Tactile cues;Verbal cues    Comprehension Verbalized understanding;Returned demonstration;Verbal cues required;Tactile cues required;Need further instruction              PT Short Term Goals - 04/30/21 1732       PT SHORT TERM GOAL #1   Title Be independent with initial home exercise program for self-management of symptoms.    Baseline Initial HEP provided at IE (04/28/2021);    Time 2    Period Weeks    Status Achieved    Target Date 05/12/21               PT Long  Term Goals - 04/28/21 1721       PT LONG TERM GOAL #1   Title Be independent with a long-term home exercise program for self-management of symptoms.    Baseline Initial HEP provided at IE (04/28/2021);    Time 12    Period Weeks    Status New   TARGET DATE FOR ALL LONG TERM GOALS: 07/21/2021     PT LONG TERM GOAL #2   Title Demonstrate improved FOTO to equal or greater than 60 by visit #14 to demonstrate improvement in overall condition and self-reported functional ability.    Baseline 42 (04/28/2021);    Time 12    Period Weeks    Status New      PT LONG TERM GOAL #3   Title Paitent will demonstrate R knee PROM equal or greater than 0-115 degrees to improve ability to step over objects, get up from a chair, and use the stairs.    Baseline 0-5-80 (04/28/2021);    Time 12    Period Weeks    Status New      PT LONG TERM GOAL #4   Title Patient will travel 1000 feet or more on 6 Minute Walk Test with LRAD to demonstrate improved community mobility.    Baseline to be tested visit 2 as appropriate (04/28/2021);    Time 12    Period Weeks    Status New       PT LONG TERM GOAL #5   Title Complete community, work and/or recreational activities without limitation due to current condition.    Baseline Functional Limitations: difficulty with mobility including bed mobility, transfers, household and community ambulation, stairs; completing ADLs and IADLs such as cooking and cleaning (04/28/2021);    Time 12    Period Weeks    Status New      Additional Long Term Goals   Additional Long Term Goals Yes      PT LONG TERM GOAL #6   Title Patient will complete 5 Time Sit to Stand Test in equal or less than 15 seconds from 18 inch plinth without use of B UE to demonstrate improved LE strength and power to improve household and community mobility and demonstrate decreased fall risk.    Baseline to be tested visit 2 as appropriate - currently needs B UE support to transfer sit <> stand (04/28/2021);    Time 12    Period Weeks    Status New                   Plan - 05/14/21 1142     Clinical Impression Statement Patient on time to her appointment today. Continued with Russian/NMES to improve R quad activation and manual techniques to improve flexion ROM. Patient was able to gain an additional 5 degrees of R knee flexion with PROM. Patient continues to be limited by severe pain in the knee and less so by back pain. Patient would benefit from continued management of limiting condition by skilled physical therapist to address remaining impairments and functional limitations to work towards stated goals and return to PLOF or maximal functional independence.    Personal Factors and Comorbidities Fitness;Time since onset of injury/illness/exacerbation;Comorbidity 3+;Past/Current Experience;Transportation    Comorbidities chronic back pain (does intermittently go down both legs), anxiety, sleep apnea, left hip fracture (2018 still bothers her, non surgical), bilateral foot pain, microcytic hypochromatic anemia, bilateral knee pain, obesity     Examination-Activity Limitations Bed Mobility;Lift;Stairs;Squat;Stand;Locomotion Level;Carry;Sit;Sleep;Dressing;Transfers    Examination-Participation Restrictions  Cleaning;Shop;Laundry;Meal Prep;Community Activity;Interpersonal Relationship    Stability/Clinical Decision Making Stable/Uncomplicated    Rehab Potential Good    PT Frequency 2x / week    PT Duration 12 weeks    PT Treatment/Interventions ADLs/Self Care Home Management;Aquatic Therapy;Cryotherapy;Electrical Stimulation;Moist Heat;Gait training;Stair training;Functional mobility training;DME Instruction;Therapeutic activities;Therapeutic exercise;Balance training;Neuromuscular re-education;Manual techniques;Dry needling;Passive range of motion;Energy conservation;Patient/family education    PT Next Visit Plan quad strengthening, ROM, balance, functional strengthening    PT Home Exercise Plan Medbridge Access Code: [email or web address]    Consulted and Agree with Plan of Care Patient             Patient will benefit from skilled therapeutic intervention in order to improve the following deficits and impairments:  Abnormal gait, Decreased skin integrity, Increased fascial restricitons, Impaired sensation, Improper body mechanics, Pain, Postural dysfunction, Increased muscle spasms, Decreased scar mobility, Decreased mobility, Decreased coordination, Decreased activity tolerance, Decreased endurance, Decreased range of motion, Decreased strength, Hypomobility, Impaired perceived functional ability, Decreased balance, Difficulty walking, Increased edema, Obesity  Visit Diagnosis: Chronic pain of right knee  Stiffness of right knee, not elsewhere classified  Muscle weakness (generalized)  Difficulty in walking, not elsewhere classified     Problem List Patient Active Problem List   Diagnosis Date Noted   Class 2 obesity 06/13/2018   Arthritis of right knee 01/31/2018   DDD (degenerative disc disease), lumbar 01/31/2018   Pain  medication agreement signed 01/31/2018   Chronic back pain 01/02/2018   Pain in both feet 08/31/2017   Knee pain, bilateral 07/05/2017   Depression 06/07/2017   OSA (obstructive sleep apnea) 05/12/2017   Closed nondisplaced fracture of anterior wall of left acetabulum (HCC) 08/27/2016   Localized osteoarthritis of right knee 08/27/2016   Closed fracture of posterior wall of left acetabulum (HCC) 08/23/2016   HTN (hypertension) 06/21/2016   Allergic rhinitis 12/06/2013   Microcytic hypochromic anemia 12/06/2013    [place]ley Dec R. Ilsa Iha, PT, DPT 05/14/21, 12:16 PM   [place] [facility] PHYSICAL AND SPORTS MEDICINE [address], [place], [address] Phone: [phone]   Fax:  [phone]  Name: Ariel Davis MRN: [id number] Date of Birth: 05/01/1965

## 2021-05-19 ENCOUNTER — Ambulatory Visit: Payer: Medicare Other | Admitting: Physical Therapy

## 2021-05-19 ENCOUNTER — Encounter: Payer: Self-pay | Admitting: Physical Therapy

## 2021-05-19 ENCOUNTER — Other Ambulatory Visit: Payer: Self-pay

## 2021-05-19 DIAGNOSIS — M6281 Muscle weakness (generalized): Secondary | ICD-10-CM

## 2021-05-19 DIAGNOSIS — G8929 Other chronic pain: Secondary | ICD-10-CM

## 2021-05-19 DIAGNOSIS — M25561 Pain in right knee: Secondary | ICD-10-CM

## 2021-05-19 DIAGNOSIS — R262 Difficulty in walking, not elsewhere classified: Secondary | ICD-10-CM

## 2021-05-19 DIAGNOSIS — M25661 Stiffness of right knee, not elsewhere classified: Secondary | ICD-10-CM

## 2021-05-19 NOTE — Therapy (Signed)
[place] [facility] [address], [place], [address] Phone: [phone]   Fax:  [phone]  Physical Therapy Treatment  Patient Details  Name: Ariel Davis MRN: [id number] Date of Birth: [birth year] Referring Provider (PT): Kimberlee Nearing, MD   Encounter Date: 05/19/2021   PT End of Session - 05/19/21 1328     Visit Number 7    Number of Visits 24    Date for PT Re-Evaluation 07/21/21    Authorization Type [employer] reporting period from 04/28/2021    Progress Note Due on Visit 10    PT Start Time 1120    PT Stop Time 1200    PT Time Calculation (min) 40 min    Activity Tolerance Patient limited by pain    Behavior During Therapy [facility] for tasks assessed/performed             Past Medical History:  Diagnosis Date   Anxiety    Chronic back pain    H/O degenerative disc disease    Hypertension    Sciatica     History reviewed. No pertinent surgical history.  There were no vitals filed for this visit.   Subjective Assessment - 05/19/21 1126     Subjective Patient reports 8/10 pain at the left SIJ glute and lateral hip region that started after she awoke today. She reports R knee is feeling pretty good today and states it is not really painful. She rates it 6/10. She arrives on RW. States her HEP is going well. She states she felt pretty good after last PT session. She thinks her increased dose of [place] is helping her.    Patient is accompained by: Family member    Pertinent History [name] who presents to outpatient physical therapy with a referral for medical diagnosis status post right total knee replacement. This patient's chief complaints consist of right knee pain and stiffness following R TKA on 04/09/2021 leading to the following functional deficits: difficulty with mobility including bed mobility, transfers, household and community ambulation,  stairs; completing ADLs and IADLs such as cooking and cleaning..  Relevant past medical history and comorbidities include chronic back pain (does intermittently go down both legs), anxiety, sleep apnea, left hip fracture (2018 still bothers her, non surgical), bilateral foot pain, microcytic hypochromatic anemia, bilateral knee pain, obesity.  Patient denies hx of cancer, stroke, seizures, lung problem, major cardiac events, diabetes, unexplained weight loss, changes in bowel or bladder problems, new onset stumbling or dropping things, spinal surgery, left hip surgery.    Limitations Sitting;House hold activities;Lifting;Standing;Walking;Other (comment)   mobility including bed mobility, transfers, household and community ambulation, stairs; completing ADLs and IADLs such as cooking and cleaning.   Patient Stated Goals "get up out of physical therapy and walk right"    Currently in Pain? Yes    Pain Score 6               OBJECTIVE   AROM R knee Extension: lacking 10 degrees in SAQ (joint stiffness?), 5 degrees with ASLR from heel elevated position.    PROM R knee Flexion: 105 degrees    TREATMENT:    Date of surgery 04/09/2021: R TKA Time since surgery: 5 weeks, 5 days  (05/19/2021);    Therapeutic exercise: to centralize symptoms and improve ROM, strength, muscular endurance, and activity tolerance required for successful completion of functional activities.  - NuStep level 6 using bilateral lower  extremities. Seat/handle setting 12/11. For improved extremity mobility, muscular endurance, and activity tolerance; and to induce the analgesic effect of aerobic exercise, stimulate improved joint nutrition, and prepare body structures and systems for following interventions. x 5  minutes. Average SPM = 80. Added pillow behind back for comfort.  - hooklying SAQ over 6 inch foam roller, 5#AW, 3x10 B LE with 5 second holds. Lacks 10 degrees of extension but demonstrates good quad activation . -  longsitting quad set 1x10 (education on what quad contraction looks like and significance).  - hooklying  R ASLR, 1x20, 1x10 with heel elevated.  - hooklying R heel slide on small yellow theraball with cuing to dig heel into ball to improve hamstring activation for reciprocal inhibition of R quads. 3x10   Manual therapy: to reduce pain and tissue tension, improve range of motion, neuromodulation, in order to promote improved ability to complete functional activities. HOOKLYING/SUPINE - scar massage over R knee scar in places where scabs have falleno off - supine with half foam roll behind R knee. R patellar mobilizations grade II-III all directions - Hooklying R tibiofemoral AP/PA joint mobs, grade III-IV, 2x30 seconds in posterior drawer position  - hooklying R knee flexion overpressure mobs with PT forearm behind knee braced on contralateral knee with PA pressure from forearm, 2x30 seconds, grades III-IV.  - PROM R knee flexion 1x10 with 10 second hold intermixed with oscillations and contract-relax techniques to modulate pain and allow relaxation.    Pt required multimodal cuing for proper technique and to facilitate improved neuromuscular control, strength, range of motion, and functional ability resulting in improved performance and form.     HOME EXERCISE PROGRAM Access Code: [email or web address] URL: [email or web address] Date: 05/05/2021 Prepared by: Norton Blizzard   Exercises Seated Knee Flexion Stretch - 3 x daily - 1 sets - 20 reps - 5 seconds hold Seated Quad Set - 3 x daily - 1 sets - 20 reps - 5 second hold hold Seated Long Arc Quad - 3 x daily - 3 sets - 10 reps - 5 seconds hold Supine Quad Set - 3 x daily - 2 sets - 10 reps - 5 seconds hold Supine Active Straight Leg Raise - 1 x daily - 3 sets - 5 reps     PT Education - 05/19/21 1328     Education Details Exercise purpose/form. Self management techniques    Person(s) Educated Patient    Methods  Explanation;Demonstration;Tactile cues;Verbal cues    Comprehension Verbalized understanding;Returned demonstration;Verbal cues required;Tactile cues required;Need further instruction              PT Short Term Goals - 04/30/21 1732       PT SHORT TERM GOAL #1   Title Be independent with initial home exercise program for self-management of symptoms.    Baseline Initial HEP provided at IE (04/28/2021);    Time 2    Period Weeks    Status Achieved    Target Date 05/12/21               PT Long Term Goals - 04/28/21 1721       PT LONG TERM GOAL #1   Title Be independent with a long-term home exercise program for self-management of symptoms.    Baseline Initial HEP provided at IE (04/28/2021);    Time 12    Period Weeks    Status New   TARGET DATE FOR ALL LONG TERM GOALS: 07/21/2021     PT LONG TERM GOAL #  2   Title Demonstrate improved FOTO to equal or greater than 60 by visit #14 to demonstrate improvement in overall condition and self-reported functional ability.    Baseline 42 (04/28/2021);    Time 12    Period Weeks    Status New      PT LONG TERM GOAL #3   Title Paitent will demonstrate R knee PROM equal or greater than 0-115 degrees to improve ability to step over objects, get up from a chair, and use the stairs.    Baseline 0-5-80 (04/28/2021);    Time 12    Period Weeks    Status New      PT LONG TERM GOAL #4   Title Patient will travel 1000 feet or more on 6 Minute Walk Test with LRAD to demonstrate improved community mobility.    Baseline to be tested visit 2 as appropriate (04/28/2021);    Time 12    Period Weeks    Status New      PT LONG TERM GOAL #5   Title Complete community, work and/or recreational activities without limitation due to current condition.    Baseline Functional Limitations: difficulty with mobility including bed mobility, transfers, household and community ambulation, stairs; completing ADLs and IADLs such as cooking and cleaning  (04/28/2021);    Time 12    Period Weeks    Status New      Additional Long Term Goals   Additional Long Term Goals Yes      PT LONG TERM GOAL #6   Title Patient will complete 5 Time Sit to Stand Test in equal or less than 15 seconds from 18 inch plinth without use of B UE to demonstrate improved LE strength and power to improve household and community mobility and demonstrate decreased fall risk.    Baseline to be tested visit 2 as appropriate - currently needs B UE support to transfer sit <> stand (04/28/2021);    Time 12    Period Weeks    Status New                   Plan - 05/19/21 1331     Clinical Impression Statement Patient demonstrates a much better R quad contraction today and improved tolerance for flexion range of motion prior to end range. Therefore discontinued use of NMES/Russian. Session continued to focus on improving R knee ROM and quad strength/activation. Patient demonstrated improved mechanics during transfers, flexing the R knee instead of moving the foot out in front. She also demonstrated positioning herself while standing with most of her weight on R LE, although she still uses the RW. Patient was also troubled by left SIJ/glute/hip pain she reports is related to a fall she had many years ago. Patient would benefit from continued management of limiting condition by skilled physical therapist to address remaining impairments and functional limitations to work towards stated goals and return to PLOF or maximal functional independence.    Personal Factors and Comorbidities Fitness;Time since onset of injury/illness/exacerbation;Comorbidity 3+;Past/Current Experience;Transportation    Comorbidities chronic back pain (does intermittently go down both legs), anxiety, sleep apnea, left hip fracture (2018 still bothers her, non surgical), bilateral foot pain, microcytic hypochromatic anemia, bilateral knee pain, obesity    Examination-Activity Limitations Bed  Mobility;Lift;Stairs;Squat;Stand;Locomotion Level;Carry;Sit;Sleep;Dressing;Transfers    Examination-Participation Restrictions Cleaning;Shop;Laundry;Meal Prep;Community Activity;Interpersonal Relationship    Stability/Clinical Decision Making Stable/Uncomplicated    Rehab Potential Good    PT Frequency 2x / week    PT Duration 12  weeks    PT Treatment/Interventions ADLs/Self Care Home Management;Aquatic Therapy;Cryotherapy;Electrical Stimulation;Moist Heat;Gait training;Stair training;Functional mobility training;DME Instruction;Therapeutic activities;Therapeutic exercise;Balance training;Neuromuscular re-education;Manual techniques;Dry needling;Passive range of motion;Energy conservation;Patient/family education    PT Next Visit Plan quad strengthening, ROM, balance, functional strengthening    PT Home Exercise Plan Medbridge Access Code: [email or web address]    Consulted and Agree with Plan of Care Patient             Patient will benefit from skilled therapeutic intervention in order to improve the following deficits and impairments:  Abnormal gait, Decreased skin integrity, Increased fascial restricitons, Impaired sensation, Improper body mechanics, Pain, Postural dysfunction, Increased muscle spasms, Decreased scar mobility, Decreased mobility, Decreased coordination, Decreased activity tolerance, Decreased endurance, Decreased range of motion, Decreased strength, Hypomobility, Impaired perceived functional ability, Decreased balance, Difficulty walking, Increased edema, Obesity  Visit Diagnosis: Chronic pain of right knee  Stiffness of right knee, not elsewhere classified  Muscle weakness (generalized)  Difficulty in walking, not elsewhere classified     Problem List Patient Active Problem List   Diagnosis Date Noted   Class 2 obesity 06/13/2018   Arthritis of right knee 01/31/2018   DDD (degenerative disc disease), lumbar 01/31/2018   Pain medication agreement signed 01/31/2018    Chronic back pain 01/02/2018   Pain in both feet 08/31/2017   Knee pain, bilateral 07/05/2017   Depression 06/07/2017   OSA (obstructive sleep apnea) 05/12/2017   Closed nondisplaced fracture of anterior wall of left acetabulum (HCC) 08/27/2016   Localized osteoarthritis of right knee 08/27/2016   Closed fracture of posterior wall of left acetabulum (HCC) 08/23/2016   HTN (hypertension) 06/21/2016   Allergic rhinitis 12/06/2013   Microcytic hypochromic anemia 12/06/2013    Huntley DecSara R. Ilsa IhaSnyder, PT, DPT 05/19/21, 1:32 PM   [place] [facility] [address] [place], [place], [id number] Phone: [phone]   Fax:  [phone]  Name: Ariel Davis MRN: [id number] Date of Birth: 1965/12/26

## 2021-05-21 ENCOUNTER — Ambulatory Visit: Payer: Medicare Other | Admitting: Physical Therapy

## 2021-05-21 ENCOUNTER — Other Ambulatory Visit: Payer: Self-pay

## 2021-05-21 ENCOUNTER — Encounter: Payer: Self-pay | Admitting: Physical Therapy

## 2021-05-21 DIAGNOSIS — M25561 Pain in right knee: Secondary | ICD-10-CM

## 2021-05-21 DIAGNOSIS — M6281 Muscle weakness (generalized): Secondary | ICD-10-CM

## 2021-05-21 DIAGNOSIS — R262 Difficulty in walking, not elsewhere classified: Secondary | ICD-10-CM

## 2021-05-21 DIAGNOSIS — M25661 Stiffness of right knee, not elsewhere classified: Secondary | ICD-10-CM

## 2021-05-21 DIAGNOSIS — G8929 Other chronic pain: Secondary | ICD-10-CM

## 2021-05-21 NOTE — Therapy (Signed)
[facility] PH[facility]IC[facility] [address] , [place], [address] Phone: [phone]   Fax:  [phone]  Physical Therapy Treatment  Patient Details  Name: Ariel Davis MRN: [id number] Date of Birth: 08-Aug-1965 Referring Provider (PT): Kimberlee Nearinghristopher William Olcott, MD   Encounter Date: 05/21/2021   PT End of Session - 05/21/21 1322     Visit Number 8    Number of Visits 24    Date for PT Re-Evaluation 07/21/21    Authorization Type [employer]ARE reporting period from 04/28/2021    Progress Note Due on Visit 10    PT Start Time 1305    PT Stop Time 1343    PT Time Calculation (min) 38 min    Activity Tolerance Patient limited by pain;Patient tolerated treatment well    Behavior During Therapy [facility] for tasks assessed/performed             Past Medical History:  Diagnosis Date   Anxiety    Chronic back pain    H/O degenerative disc disease    Hypertension    Sciatica     History reviewed. No pertinent surgical history.  There were no vitals filed for this visit.   Subjective Assessment - 05/21/21 1308     Subjective Patient arrives on a RW. States her R knee is feeling okay and doesn't have much pain. She rates the pain in her R knee as 6/10. She states her left SIJ/glute/hip region is still really bothering her and rates the pain there 6/10. States she felt okay after last PT session. States she is doing her HEP but it still really hurts.    Patient is accompained by: Family member    Pertinent History [name] who presents to outpatient physical therapy with a referral for medical diagnosis status post right total knee replacement. This patient's chief complaints consist of right knee pain and stiffness following R TKA on 04/09/2021 leading to the following functional deficits: difficulty with mobility including bed mobility, transfers, household and community ambulation, stairs;  completing ADLs and IADLs such as cooking and cleaning..  Relevant past medical history and comorbidities include chronic back pain (does intermittently go down both legs), anxiety, sleep apnea, left hip fracture (2018 still bothers her, non surgical), bilateral foot pain, microcytic hypochromatic anemia, bilateral knee pain, obesity.  Patient denies hx of cancer, stroke, seizures, lung problem, major cardiac events, diabetes, unexplained weight loss, changes in bowel or bladder problems, new onset stumbling or dropping things, spinal surgery, left hip surgery.    Limitations Sitting;House hold activities;Lifting;Standing;Walking;Other (comment)   mobility including bed mobility, transfers, household and community ambulation, stairs; completing ADLs and IADLs such as cooking and cleaning.   Patient Stated Goals "get up out of physical therapy and walk right"    Currently in Pain? Yes    Pain Score 6                  OBJECTIVE   PROM R knee Flexion: 105 degrees closed chain   TREATMENT:    Date of surgery 04/09/2021: R TKA Time since surgery: 6 weeks  (05/21/2021);    Therapeutic exercise: to centralize symptoms and improve ROM, strength, muscular endurance, and activity tolerance required for successful completion of functional activities.  - NuStep level 6 using bilateral lower extremities. Seat/handle setting 12/11. For improved extremity mobility, muscular endurance, and activity tolerance; and to induce the analgesic effect of aerobic exercise, stimulate  improved joint nutrition, and prepare body structures and systems for following interventions. x 5  minutes. Average SPM = 80.  - standing R knee flexion stretch on step, 1x11 with 10 second holds, B UE support. (Discontinued due to excessive left hip pain) - seated R hamstring ball rolls (flexion/extension), 1x20  - standing R TKE with blue band and B UE support, 2x6 with 5 second hold. (Limited by left glute pain with standing).    - seated knee extension on OMEGA machine, both legs together, 1x10 with 5# - seated hamstring curl at [facility] machine, 1x15 at 20# BLE, 2x10 at 25# B LE.  - squat with B UE support at TM bar, 3x10 progressing to deeper squat with chair behind her.  - review of scar massage - Education on HEP including handout    Pt required multimodal cuing for proper technique and to facilitate improved neuromuscular control, strength, range of motion, and functional ability resulting in improved performance and form.     HOME EXERCISE PROGRAM Access Code: [email or web address] URL: [email or web address] Date: 05/21/2021 Prepared by: Norton Blizzard  Exercises Seated Knee Flexion Stretch - 3 x daily - 1 sets - 20 reps - 5 seconds hold Seated Quad Set - 3 x daily - 1 sets - 20 reps - 5 second hold hold Seated Long Arc Quad - 3 x daily - 3 sets - 10 reps - 5 seconds hold Supine Quad Set - 3 x daily - 2 sets - 10 reps - 5 seconds hold Supine Active Straight Leg Raise - 1 x daily - 3 sets - 5 reps Squat with Counter Support - 1 x daily - 3 sets - 10 reps Standing Knee Flexion Stretch on Step - 1-3 x daily - 3 sets - 10 reps - 10 seconds hold  Patient Education Scar Massage   PT Education - 05/21/21 1322     Education Details Exercise purpose/form. Self management techniques    Person(s) Educated Patient    Methods Explanation;Demonstration;Tactile cues;Verbal cues    Comprehension Verbalized understanding;Returned demonstration;Verbal cues required;Tactile cues required;Need further instruction              PT Short Term Goals - 04/30/21 1732       PT SHORT TERM GOAL #1   Title Be independent with initial home exercise program for self-management of symptoms.    Baseline Initial HEP provided at IE (04/28/2021);    Time 2    Period Weeks    Status Achieved    Target Date 05/12/21               PT Long Term Goals - 04/28/21 1721       PT LONG TERM GOAL #1   Title Be independent  with a long-term home exercise program for self-management of symptoms.    Baseline Initial HEP provided at IE (04/28/2021);    Time 12    Period Weeks    Status New   TARGET DATE FOR ALL LONG TERM GOALS: 07/21/2021     PT LONG TERM GOAL #2   Title Demonstrate improved FOTO to equal or greater than 60 by visit #14 to demonstrate improvement in overall condition and self-reported functional ability.    Baseline 42 (04/28/2021);    Time 12    Period Weeks    Status New      PT LONG TERM GOAL #3   Title Paitent will demonstrate R knee PROM equal or greater than 0-115 degrees to improve ability to  step over objects, get up from a chair, and use the stairs.    Baseline 0-5-80 (04/28/2021);    Time 12    Period Weeks    Status New      PT LONG TERM GOAL #4   Title Patient will travel 1000 feet or more on 6 Minute Walk Test with LRAD to demonstrate improved community mobility.    Baseline to be tested visit 2 as appropriate (04/28/2021);    Time 12    Period Weeks    Status New      PT LONG TERM GOAL #5   Title Complete community, work and/or recreational activities without limitation due to current condition.    Baseline Functional Limitations: difficulty with mobility including bed mobility, transfers, household and community ambulation, stairs; completing ADLs and IADLs such as cooking and cleaning (04/28/2021);    Time 12    Period Weeks    Status New      Additional Long Term Goals   Additional Long Term Goals Yes      PT LONG TERM GOAL #6   Title Patient will complete 5 Time Sit to Stand Test in equal or less than 15 seconds from 18 inch plinth without use of B UE to demonstrate improved LE strength and power to improve household and community mobility and demonstrate decreased fall risk.    Baseline to be tested visit 2 as appropriate - currently needs B UE support to transfer sit <> stand (04/28/2021);    Time 12    Period Weeks    Status New                   Plan -  05/21/21 2008     Clinical Impression Statement Patient tolerated treatment with some difficulty due to left hip/glute pain that was worse with standing and was limited in knee extension exercise on the OMEGA machine by anterior R knee pain that decreased with rest. Patient continues to show much improved R quad activation but is not yet strong enough to stand up from a chair without UE support. She also is lacking R knee flexion and would benefit from continued aggressive knee flexion stretches planned to continued at next session. Patient would benefit from continued management of limiting condition by skilled physical therapist to address remaining impairments and functional limitations to work towards stated goals and return to PLOF or maximal functional independence.    Personal Factors and Comorbidities Fitness;Time since onset of injury/illness/exacerbation;Comorbidity 3+;Past/Current Experience;Transportation    Comorbidities chronic back pain (does intermittently go down both legs), anxiety, sleep apnea, left hip fracture (2018 still bothers her, non surgical), bilateral foot pain, microcytic hypochromatic anemia, bilateral knee pain, obesity    Examination-Activity Limitations Bed Mobility;Lift;Stairs;Squat;Stand;Locomotion Level;Carry;Sit;Sleep;Dressing;Transfers    Examination-Participation Restrictions Cleaning;Shop;Laundry;Meal Prep;Community Activity;Interpersonal Relationship    Stability/Clinical Decision Making Stable/Uncomplicated    Rehab Potential Good    PT Frequency 2x / week    PT Duration 12 weeks    PT Treatment/Interventions ADLs/Self Care Home Management;Aquatic Therapy;Cryotherapy;Electrical Stimulation;Moist Heat;Gait training;Stair training;Functional mobility training;DME Instruction;Therapeutic activities;Therapeutic exercise;Balance training;Neuromuscular re-education;Manual techniques;Dry needling;Passive range of motion;Energy conservation;Patient/family education     PT Next Visit Plan quad strengthening, ROM, balance, functional strengthening    PT Home Exercise Plan Medbridge Access Code: [email or web address]    Consulted and Agree with Plan of Care Patient             Patient will benefit from skilled therapeutic intervention in order to improve the following deficits and  impairments:  Abnormal gait, Decreased skin integrity, Increased fascial restricitons, Impaired sensation, Improper body mechanics, Pain, Postural dysfunction, Increased muscle spasms, Decreased scar mobility, Decreased mobility, Decreased coordination, Decreased activity tolerance, Decreased endurance, Decreased range of motion, Decreased strength, Hypomobility, Impaired perceived functional ability, Decreased balance, Difficulty walking, Increased edema, Obesity  Visit Diagnosis: Chronic pain of right knee  Stiffness of right knee, not elsewhere classified  Muscle weakness (generalized)  Difficulty in walking, not elsewhere classified     Problem List Patient Active Problem List   Diagnosis Date Noted   Class 2 obesity 06/13/2018   Arthritis of right knee 01/31/2018   DDD (degenerative disc disease), lumbar 01/31/2018   Pain medication agreement signed 01/31/2018   Chronic back pain 01/02/2018   Pain in both feet 08/31/2017   Knee pain, bilateral 07/05/2017   Depression 06/07/2017   OSA (obstructive sleep apnea) 05/12/2017   Closed nondisplaced fracture of anterior wall of left acetabulum (HCC) 08/27/2016   Localized osteoarthritis of right knee 08/27/2016   Closed fracture of posterior wall of left acetabulum (HCC) 08/23/2016   HTN (hypertension) 06/21/2016   Allergic rhinitis 12/06/2013   Microcytic hypochromic anemia 12/06/2013    [place] Dec R. Ilsa Iha, PT, DPT 05/21/21, 8:09 PM   [place] [facility] PH[facility] AND SPORTS MEDICINE [address], [place], [address] Phone: [phone]   Fax:  [phone]  Name: AURBREE EURE MRN:  [id number] Date of Birth: [birth year]

## 2021-05-26 ENCOUNTER — Ambulatory Visit: Payer: Medicare Other | Admitting: Physical Therapy

## 2021-05-26 ENCOUNTER — Encounter: Payer: Self-pay | Admitting: Physical Therapy

## 2021-05-26 ENCOUNTER — Other Ambulatory Visit: Payer: Self-pay

## 2021-05-26 DIAGNOSIS — M6281 Muscle weakness (generalized): Secondary | ICD-10-CM

## 2021-05-26 DIAGNOSIS — R262 Difficulty in walking, not elsewhere classified: Secondary | ICD-10-CM

## 2021-05-26 DIAGNOSIS — M25561 Pain in right knee: Secondary | ICD-10-CM | POA: Diagnosis not present

## 2021-05-26 DIAGNOSIS — M25661 Stiffness of right knee, not elsewhere classified: Secondary | ICD-10-CM

## 2021-05-26 DIAGNOSIS — G8929 Other chronic pain: Secondary | ICD-10-CM

## 2021-05-26 NOTE — Therapy (Signed)
[facility] PHYSICAL AND SPORTS MEDICINE [address], [place], [address] Phone: [phone]   Fax:  [phone]  Physical Therapy Treatment  Patient Details  Name: Ariel Davis MRN: [id number] Date of Birth: 18-Nov-1965 Referring Provider (PT): Kimberlee Nearing, MD   Encounter Date: 05/26/2021   PT End of Session - 05/26/21 1326     Visit Number 9    Number of Visits 24    Date for PT Re-Evaluation 07/21/21    Authorization Type [employer]ICARE reporting period from 04/28/2021    Progress Note Due on Visit 10    PT Start Time 1123    PT Stop Time 1204    PT Time Calculation (min) 41 min    Activity Tolerance Patient limited by pain;Patient tolerated treatment well    Behavior During Therapy [facility] for tasks assessed/performed             Past Medical History:  Diagnosis Date   Anxiety    Chronic back pain    H/O degenerative disc disease    Hypertension    Sciatica     History reviewed. No pertinent surgical history.  There were no vitals filed for this visit.   Subjective Assessment - 05/26/21 1127     Subjective Patient arrives on RW. States her right knee is not hurting to bad today. She rates her pain 5-6/10 at the anterior right knee. She continues to be most bothered by her left hip region that she rates 7-8/10 pain. She saw her orthopedic surgeon yesterday who thought maybe her body is trying to get used to new alignment with the knee replacement. She had a lot of pain in her left hip/glute after last PT session. MD note mentions a PT order to include left glute/low back pain but order received by PT only addresses R knee.    Patient is accompained by: Family member    Pertinent History [name] who presents to outpatient physical therapy with a referral for medical diagnosis status post right total knee replacement. This patient's chief complaints consist of right knee pain and  stiffness following R TKA on 04/09/2021 leading to the following functional deficits: difficulty with mobility including bed mobility, transfers, household and community ambulation, stairs; completing ADLs and IADLs such as cooking and cleaning..  Relevant past medical history and comorbidities include chronic back pain (does intermittently go down both legs), anxiety, sleep apnea, left hip fracture (2018 still bothers her, non surgical), bilateral foot pain, microcytic hypochromatic anemia, bilateral knee pain, obesity.  Patient denies hx of cancer, stroke, seizures, lung problem, major cardiac events, diabetes, unexplained weight loss, changes in bowel or bladder problems, new onset stumbling or dropping things, spinal surgery, left hip surgery.    Limitations Sitting;House hold activities;Lifting;Standing;Walking;Other (comment)   mobility including bed mobility, transfers, household and community ambulation, stairs; completing ADLs and IADLs such as cooking and cleaning.   Patient Stated Goals "get up out of physical therapy and walk right"    Currently in Pain? Yes    Pain Score 8                  OBJECTIVE   PROM R knee Flexion: 107 degrees    TREATMENT:    Date of surgery 04/09/2021: R TKA Time since surgery: 6 weeks, 5 days (05/26/2021);    Therapeutic exercise: to centralize symptoms and improve ROM, strength, muscular endurance, and activity tolerance required for successful  completion of functional activities.  - NuStep level 6 using bilateral lower extremities. Seat/handle setting 12/11. For improved extremity mobility, muscular endurance, and activity tolerance; and to induce the analgesic effect of aerobic exercise, stimulate improved joint nutrition, and prepare body structures and systems for following interventions. x 6  minutes. Average SPM = 87.  - seated long arc quad, 3x10 R side and 2x10 L side with 10#AW. (Unable to continue on L side due to pain at left anterior knee).   (Manual therapy - see below) - seated R knee flexion stretch planting R foot on floor and scooting bottom forward on plinth. 1x5 with 5-10 second hold.   Manual therapy: to reduce pain and tissue tension, improve range of motion, neuromodulation, in order to promote improved ability to complete functional activities. HOOKLYING - R knee scar massage over closed portions of scar - R patellar mobilizations grade III-IV in all directions, knee slightly flexed over half foam roll - R tibiofemoral joint mobilizations grade IV: AP with femur braced on foam roll, 2x30 seconds, AP in 80 degrees flexion, 2x30 seconds, with R knee flexed over PT's forearm with hand braced on contralateral knee (overpressure with PA mob) 2x30 seconds, rotational mob ER and IR at 80 degrees flexion.  - R knee PROM with contract relax, until unable to move further into flexion to patient tolerance.  - R knee PROM with concurrent active R knee flexion, 1x10 with 10 second holds.  - Education on HEP including handout    Pt required multimodal cuing for proper technique and to facilitate improved neuromuscular control, strength, range of motion, and functional ability resulting in improved performance and form.    HOME EXERCISE PROGRAM Access Code: [email or web address] URL: [email or web address] Date: 05/26/2021 Prepared by: Norton BlizzardSara Cia Garretson  Exercises Seated Knee Flexion Stretch - 3 x daily - 2 sets - 10 reps - 10 seconds hold Seated Long Arc Quad - 3 x daily - 3 sets - 10 reps - 5 seconds hold Supine Active Straight Leg Raise - 1 x daily - 3 sets - 5 reps Squat with Counter Support - 1 x daily - 3 sets - 10 reps Standing Knee Flexion Stretch on Step - 1-3 x daily - 3 sets - 10 reps - 10 seconds hold  Patient Education Scar Massage       PT Education - 05/26/21 1326     Education Details Exercise purpose/form. Self management techniques    Person(s) Educated Patient    Methods Explanation;Demonstration;Tactile  cues;Verbal cues    Comprehension Verbalized understanding;Returned demonstration;Verbal cues required;Tactile cues required;Need further instruction              PT Short Term Goals - 04/30/21 1732       PT SHORT TERM GOAL #1   Title Be independent with initial home exercise program for self-management of symptoms.    Baseline Initial HEP provided at IE (04/28/2021);    Time 2    Period Weeks    Status Achieved    Target Date 05/12/21               PT Long Term Goals - 04/28/21 1721       PT LONG TERM GOAL #1   Title Be independent with a long-term home exercise program for self-management of symptoms.    Baseline Initial HEP provided at IE (04/28/2021);    Time 12    Period Weeks    Status New   TARGET DATE FOR ALL LONG TERM GOALS:  07/21/2021     PT LONG TERM GOAL #2   Title Demonstrate improved FOTO to equal or greater than 60 by visit #14 to demonstrate improvement in overall condition and self-reported functional ability.    Baseline 42 (04/28/2021);    Time 12    Period Weeks    Status New      PT LONG TERM GOAL #3   Title Paitent will demonstrate R knee PROM equal or greater than 0-115 degrees to improve ability to step over objects, get up from a chair, and use the stairs.    Baseline 0-5-80 (04/28/2021);    Time 12    Period Weeks    Status New      PT LONG TERM GOAL #4   Title Patient will travel 1000 feet or more on 6 Minute Walk Test with LRAD to demonstrate improved community mobility.    Baseline to be tested visit 2 as appropriate (04/28/2021);    Time 12    Period Weeks    Status New      PT LONG TERM GOAL #5   Title Complete community, work and/or recreational activities without limitation due to current condition.    Baseline Functional Limitations: difficulty with mobility including bed mobility, transfers, household and community ambulation, stairs; completing ADLs and IADLs such as cooking and cleaning (04/28/2021);    Time 12    Period  Weeks    Status New      Additional Long Term Goals   Additional Long Term Goals Yes      PT LONG TERM GOAL #6   Title Patient will complete 5 Time Sit to Stand Test in equal or less than 15 seconds from 18 inch plinth without use of B UE to demonstrate improved LE strength and power to improve household and community mobility and demonstrate decreased fall risk.    Baseline to be tested visit 2 as appropriate - currently needs B UE support to transfer sit <> stand (04/28/2021);    Time 12    Period Weeks    Status New                   Plan - 05/26/21 1326     Clinical Impression Statement Patient continues to demonstrate improving R quad contraction and also continues to be limited by left hip/glute pain. Continued to work on quad strengthening as tolerated as well as R knee ROM. Patient is approaching goal of 110 degrees flexion but finds end range knee flexion almost intolerable with crying and calling out during manual therapy. Patient reports buckling in the left knee and that she has used a walker for years to help  her stand up straighter due to back problems. Her right knee appears ready to discharge off the walker but have not encouraged this due to left LE and back limitations that increase her risk of fall with no UE support. Plan to continue working on R knee strength and ROM, progressing to functional activities as able. Plan to address left sided as needed to continue R knee rehab or as PT order for that region is received. Patient would benefit from continued management of limiting condition by skilled physical therapist to address remaining impairments and functional limitations to work towards stated goals and return to PLOF or maximal functional independence.    Personal Factors and Comorbidities Fitness;Time since onset of injury/illness/exacerbation;Comorbidity 3+;Past/Current Experience;Transportation    Comorbidities chronic back pain (does intermittently go down  both legs), anxiety, sleep  apnea, left hip fracture (2018 still bothers her, non surgical), bilateral foot pain, microcytic hypochromatic anemia, bilateral knee pain, obesity    Examination-Activity Limitations Bed Mobility;Lift;Stairs;Squat;Stand;Locomotion Level;Carry;Sit;Sleep;Dressing;Transfers    Examination-Participation Restrictions Cleaning;Shop;Laundry;Meal Prep;Community Activity;Interpersonal Relationship    Stability/Clinical Decision Making Stable/Uncomplicated    Rehab Potential Good    PT Frequency 2x / week    PT Duration 12 weeks    PT Treatment/Interventions ADLs/Self Care Home Management;Aquatic Therapy;Cryotherapy;Electrical Stimulation;Moist Heat;Gait training;Stair training;Functional mobility training;DME Instruction;Therapeutic activities;Therapeutic exercise;Balance training;Neuromuscular re-education;Manual techniques;Dry needling;Passive range of motion;Energy conservation;Patient/family education    PT Next Visit Plan quad strengthening, ROM, balance, functional strengthening    PT Home Exercise Plan Medbridge Access Code: [email or web address]    Consulted and Agree with Plan of Care Patient             Patient will benefit from skilled therapeutic intervention in order to improve the following deficits and impairments:  Abnormal gait, Decreased skin integrity, Increased fascial restricitons, Impaired sensation, Improper body mechanics, Pain, Postural dysfunction, Increased muscle spasms, Decreased scar mobility, Decreased mobility, Decreased coordination, Decreased activity tolerance, Decreased endurance, Decreased range of motion, Decreased strength, Hypomobility, Impaired perceived functional ability, Decreased balance, Difficulty walking, Increased edema, Obesity  Visit Diagnosis: Chronic pain of right knee  Stiffness of right knee, not elsewhere classified  Muscle weakness (generalized)  Difficulty in walking, not elsewhere classified     Problem List Patient  Active Problem List   Diagnosis Date Noted   Class 2 obesity 06/13/2018   Arthritis of right knee 01/31/2018   DDD (degenerative disc disease), lumbar 01/31/2018   Pain medication agreement signed 01/31/2018   Chronic back pain 01/02/2018   Pain in both feet 08/31/2017   Knee pain, bilateral 07/05/2017   Depression 06/07/2017   OSA (obstructive sleep apnea) 05/12/2017   Closed nondisplaced fracture of anterior wall of left acetabulum (HCC) 08/27/2016   Localized osteoarthritis of right knee 08/27/2016   Closed fracture of posterior wall of left acetabulum (HCC) 08/23/2016   HTN (hypertension) 06/21/2016   Allergic rhinitis 12/06/2013   Microcytic hypochromic anemia 12/06/2013    [place]ley Dec R. Ilsa Iha, PT, DPT 05/26/21, 1:28 PM   [place] [facility] PHYSICAL AND SPORTS MEDICINE [address] [place], [address] Phone: [phone]   Fax:  [phone]  Name: Ariel Davis MRN: [id number] Date of Birth: 1966-03-27

## 2021-05-28 ENCOUNTER — Ambulatory Visit: Payer: Medicare Other | Admitting: Physical Therapy

## 2021-05-28 ENCOUNTER — Encounter: Payer: Self-pay | Admitting: Physical Therapy

## 2021-05-28 ENCOUNTER — Other Ambulatory Visit: Payer: Self-pay

## 2021-05-28 DIAGNOSIS — M25561 Pain in right knee: Secondary | ICD-10-CM | POA: Diagnosis not present

## 2021-05-28 DIAGNOSIS — M25661 Stiffness of right knee, not elsewhere classified: Secondary | ICD-10-CM

## 2021-05-28 DIAGNOSIS — R262 Difficulty in walking, not elsewhere classified: Secondary | ICD-10-CM

## 2021-05-28 DIAGNOSIS — G8929 Other chronic pain: Secondary | ICD-10-CM

## 2021-05-28 DIAGNOSIS — M6281 Muscle weakness (generalized): Secondary | ICD-10-CM

## 2021-05-28 NOTE — Therapy (Signed)
[facility] [address], [place], [address] Phone: [phone]   Fax:  [phone]  Physical Therapy Treatment / Progress Note Dates of reporting from 04/28/2021 to 05/28/2021  Patient Details  Name: Ariel Davis MRN: [id number] Date of Birth: [birth year] Referring Provider (PT): Marlana Latus, MD   Encounter Date: 05/28/2021   PT End of Session - 05/28/21 2029     Visit Number 10    Number of Visits 24    Date for PT Re-Evaluation 07/21/21    Authorization Type [employer] reporting period from 04/28/2021    Progress Note Due on Visit 10    PT Start Time 1353    PT Stop Time 1431    PT Time Calculation (min) 38 min    Activity Tolerance Patient limited by pain;Patient tolerated treatment well    Behavior During Therapy [facility] for tasks assessed/performed             Past Medical History:  Diagnosis Date   Anxiety    Chronic back pain    H/O degenerative disc disease    Hypertension    Sciatica     History reviewed. No pertinent surgical history.  There were no vitals filed for this visit.   Subjective Assessment - 05/28/21 1353     Subjective Patient arrives on RW. States her left  hip/glute continues to be worse. She reports 8/10 pain at that location upon arrival. R knee is sore (7/10) but is "nothing like this over here" referring to left hip. Knee was sore after last PT session and left hip was still a problem. She feels like PT is helping her. She has noticed she can bend her R knee back better since she started PT and it is easier for her to get around. She feels like it is making her worse in her left hip. She states the left sided pain is really what is holding her back. Patient states she is participating in [place]. Patient reports her left sided pain is over the lateral hip, groin, buttocks, and low back.    Patient is accompained by: Family member    Pertinent  History [name] who presents to outpatient physical therapy with a referral for medical diagnosis status post right total knee replacement. This patient's chief complaints consist of right knee pain and stiffness following R TKA on 04/09/2021 leading to the following functional deficits: difficulty with mobility including bed mobility, transfers, household and community ambulation, stairs; completing ADLs and IADLs such as cooking and cleaning..  Relevant past medical history and comorbidities include chronic back pain (does intermittently go down both legs), anxiety, sleep apnea, left hip fracture (2018 still bothers her, non surgical), bilateral foot pain, microcytic hypochromatic anemia, bilateral knee pain, obesity.  Patient denies hx of cancer, stroke, seizures, lung problem, major cardiac events, diabetes, unexplained weight loss, changes in bowel or bladder problems, new onset stumbling or dropping things, spinal surgery, left hip surgery.    Limitations Sitting;House hold activities;Lifting;Standing;Walking;Other (comment)   mobility including bed mobility, transfers, household and community ambulation, stairs; completing ADLs and IADLs such as cooking and cleaning.   Patient Stated Goals "get up out of physical therapy and walk right"    Currently in Pain? Yes    Pain Score 8     Effect of Pain on Daily Activities Functional Limitations: difficulty with mobility including bed mobility, transfers, household and community ambulation, stairs;  completing ADLs and IADLs such as cooking and cleaning. (improved bed mobility, stairs; is having a lot of trouble due to the left hip pain).              OBJECTIVE  SELF-REPORTED FUNCTION FOTO score: 58/100 (knee questionnaire)  PERIPHERAL JOINT MOTION (in degrees) Active Range of Motion (AROM) *Indicates pain 04/28/21 05/28/21 Date  Joint/Motion R/L R/L R/L  Knee        Extension Lacking 30/ Lacking 8/ /  Flexion 80*/ / /   Comments:  04/28/21: L knee WFL, R knee demo extensor lag in ASLR   Passive Range of Motion (PROM) *Indicates pain 04/28/21 05/28/21 Date  Joint/Motion R/L R/L R/L  Knee        Extension -5/4 -5/ /  Flexion 80*/128 115/ /  Comments:  04/28/2021: B hips and ankles appear WFL except pain at left hip with flexion.  05/28/21: pain in L groin with L hip IR at 90 degrees. Knee flexion CKC after 18 reps of 5 second holds.   HIP SPECIAL TESTS FADDIR: L = positive for groin pain. FABER: L = negative.  LOWER LIMB NEURODYNAMIC TESTS Straight Leg Raise (Sciatic nerve)  L  = negative   FUNCTIONAL TESTING - 6 Minute Walk Test: 1069 with RW. Difficulty due to left glute/hip pain - 5 Time Sit To Stand: 12 seconds from 18.5 inch plinth with B UE support. Difficulty due to left glute/hip pain. Unable to get up without using BUE due to knee weakness.    TREATMENT:    Date of surgery 04/09/2021: R TKA Time since surgery: 7 weeks (05/28/2021);    Therapeutic exercise: to centralize symptoms and improve ROM, strength, muscular endurance, and activity tolerance required for successful completion of functional activities.  - ambulation around clinic for distance in 6 minutes (see 6MWT above).  - sit <> stand from 18.5 inch plinth for time using B UE and RW placed in front for safety, 1x1, 1x5 (see 5TSTS test above).  -  measurements to assess progress (see above).  - standing R knee flexion stretch step standing on 2nd stair with B UE support on handrails. 1x11, 1x4, 1x5. (Limited by this exercise increasing pain in left hip/glue region).    Manual therapy: to reduce pain and tissue tension, improve range of motion, neuromodulation, in order to promote improved ability to complete functional activities. HOOKLYING - R knee scar massage over closed portions of scar - R patellar mobilizations grade III-IV in all directions, knee slightly flexed  - R tibiofemoral joint mobilizations grade IV: AP in 80  degrees flexion, 2x30 seconds.   Pt required multimodal cuing for proper technique and to facilitate improved neuromuscular control, strength, range of motion, and functional ability resulting in improved performance and form.     HOME EXERCISE PROGRAM Access Code: [email or web address] URL: [email or web address] Date: 05/26/2021 Prepared by: Rosita Kea   Exercises Seated Knee Flexion Stretch - 3 x daily - 2 sets - 10 reps - 10 seconds hold Seated Long Arc Quad - 3 x daily - 3 sets - 10 reps - 5 seconds hold Supine Active Straight Leg Raise - 1 x daily - 3 sets - 5 reps Squat with Counter Support - 1 x daily - 3 sets - 10 reps Standing Knee Flexion Stretch on Step - 1-3 x daily - 3 sets - 10 reps - 10 seconds hold   Patient Education Scar Massage     PT Education - 05/28/21 1356  Education Details Exercise purpose/form. Self management techniques. POC, progress    Person(s) Educated Patient    Methods Explanation;Demonstration;Tactile cues;Verbal cues    Comprehension Verbalized understanding;Returned demonstration;Verbal cues required;Tactile cues required;Need further instruction              PT Short Term Goals - 04/30/21 1732       PT SHORT TERM GOAL #1   Title Be independent with initial home exercise program for self-management of symptoms.    Baseline Initial HEP provided at IE (04/28/2021);    Time 2    Period Weeks    Status Achieved    Target Date 05/12/21               PT Long Term Goals - 05/28/21 2022       PT LONG TERM GOAL #1   Title Be independent with a long-term home exercise program for self-management of symptoms.    Baseline Initial HEP provided at IE (04/28/2021); patient reports good participation (05/28/2021);    Time 12    Period Weeks    Status Partially Met   TARGET DATE FOR ALL LONG TERM GOALS: 07/21/2021     PT LONG TERM GOAL #2   Title Demonstrate improved FOTO to equal or greater than 60 by visit #14 to demonstrate  improvement in overall condition and self-reported functional ability.    Baseline 42 (04/28/2021); 58 at visit #10 (05/28/2021);    Time 12    Period Weeks    Status Partially Met      PT LONG TERM GOAL #3   Title Paitent will demonstrate R knee PROM equal or greater than 0-115 degrees to improve ability to step over objects, get up from a chair, and use the stairs.    Baseline 0-5-80 (04/28/2021); 0-7-115 (05/28/2021);    Time 12    Period Weeks    Status Partially Met      PT LONG TERM GOAL #4   Title Patient will travel 1000 feet or more on 6 Minute Walk Test with LRAD to demonstrate improved community mobility.    Baseline to be tested visit 2 as appropriate (04/28/2021); 1079 with RW improved buttock pain (04/30/2021); 1069 with RW, Difficulty due to left glute/hip pain (05/28/2021);    Time 12    Period Weeks    Status On-going      PT LONG TERM GOAL #5   Title Complete community, work and/or recreational activities without limitation due to current condition.    Baseline Functional Limitations: difficulty with mobility including bed mobility, transfers, household and community ambulation, stairs; completing ADLs and IADLs such as cooking and cleaning (04/28/2021); improved bed mobility, household and community ambulation, stairs but still has difficulty, is also limited by left glute/back/hip pain (05/28/2021);    Time 12    Period Weeks    Status Partially Met      PT LONG TERM GOAL #6   Title Patient will complete 5 Time Sit to Stand Test in equal or less than 15 seconds from 18 inch plinth without use of B UE to demonstrate improved LE strength and power to improve household and community mobility and demonstrate decreased fall risk.    Baseline to be tested visit 2 as appropriate - currently needs B UE support to transfer sit <> stand (04/28/2021); 16 seconds from 18.5 inch plinth with B UE support and RW in front for safety (04/30/2021); 2 seconds from 18.5 inch plinth with B UE  support. Difficulty due  to left glute/hip pain. Unable to get up without using BUE due to knee weakness (05/28/2021);    Time 12    Period Weeks    Status Partially Met                   Plan - 05/28/21 2021     Clinical Impression Statement Patient has attended 10 physical therapy sessions since starting this episode of care on 04/28/2021. Patient initially had extensor lag and poor R quad contraction but this has improved significantly and quad is activating normally again and responding to strengthening. Focus of sessions so far has been getting quad activation and improving flexion PROM. Patient started out with good extension ROM but would benefit from more of a focus on extension going forward. She has met her MD's goal for flexion today but with significant stiffness and pain to get there. She has been unable to tolerate pain medications besides tylenol and has experienced a significant amount of pain with R knee movement, making rehab very challenging for her. She has also been limited by increasing left low back/glute/hip pain that is now more limiting to her than her surgical knee. Her right knee appears ready to transition away form an assistive device, but patient has been using a RW for years due to back problems and report L knee buckling that decreases safety and practicality of transitioning off the walker. Plan to continue to assess and progress as appropriate. Patient would benefit from PT evaluation of left low back/glute/hip pain and will address this in the future if referring clinician agrees and provides PT order for this region. Patient continues to have deficits such as pain, muscle performance, balance, and ROM impairments that are limiting function and quality of life. Patient would benefit from continued management of limiting condition by skilled physical therapist to address remaining impairments and functional limitations to work towards stated goals and return to PLOF  or maximal functional independence.    Personal Factors and Comorbidities Fitness;Time since onset of injury/illness/exacerbation;Comorbidity 3+;Past/Current Experience;Transportation    Comorbidities chronic back pain (does intermittently go down both legs), anxiety, sleep apnea, left hip fracture (2018 still bothers her, non surgical), bilateral foot pain, microcytic hypochromatic anemia, bilateral knee pain, obesity    Examination-Activity Limitations Bed Mobility;Lift;Stairs;Squat;Stand;Locomotion Level;Carry;Sit;Sleep;Dressing;Transfers    Examination-Participation Restrictions Cleaning;Shop;Laundry;Meal Prep;Community Activity;Interpersonal Relationship    Stability/Clinical Decision Making Stable/Uncomplicated    Rehab Potential Good    PT Frequency 2x / week    PT Duration 12 weeks    PT Treatment/Interventions ADLs/Self Care Home Management;Aquatic Therapy;Cryotherapy;Electrical Stimulation;Moist Heat;Gait training;Stair training;Functional mobility training;DME Instruction;Therapeutic activities;Therapeutic exercise;Balance training;Neuromuscular re-education;Manual techniques;Dry needling;Passive range of motion;Energy conservation;Patient/family education    PT Next Visit Plan quad strengthening, ROM, balance, functional strengthening    PT Home Exercise Plan Medbridge Access Code: [email or web address]    Consulted and Agree with Plan of Care Patient             Patient will benefit from skilled therapeutic intervention in order to improve the following deficits and impairments:  Abnormal gait, Decreased skin integrity, Increased fascial restricitons, Impaired sensation, Improper body mechanics, Pain, Postural dysfunction, Increased muscle spasms, Decreased scar mobility, Decreased mobility, Decreased coordination, Decreased activity tolerance, Decreased endurance, Decreased range of motion, Decreased strength, Hypomobility, Impaired perceived functional ability, Decreased balance, Difficulty  walking, Increased edema, Obesity  Visit Diagnosis: Chronic pain of right knee  Stiffness of right knee, not elsewhere classified  Muscle weakness (generalized)  Difficulty in walking, not elsewhere classified  Problem List Patient Active Problem List   Diagnosis Date Noted   Class 2 obesity 06/13/2018   Arthritis of right knee 01/31/2018   DDD (degenerative disc disease), lumbar 01/31/2018   Pain medication agreement signed 01/31/2018   Chronic back pain 01/02/2018   Pain in both feet 08/31/2017   Knee pain, bilateral 07/05/2017   Depression 06/07/2017   OSA (obstructive sleep apnea) 05/12/2017   Closed nondisplaced fracture of anterior wall of left acetabulum ([place]) 08/27/2016   Localized osteoarthritis of right knee 08/27/2016   Closed fracture of posterior wall of left acetabulum ([place]) 08/23/2016   HTN (hypertension) 06/21/2016   Allergic rhinitis 12/06/2013   Microcytic hypochromic anemia 12/06/2013    Clarise Cruz R. Graylon Good, PT, DPT 05/28/21, 8:30 PM  [facility] [address] [place], [address] Phone: [phone]   Fax:  [phone]  Name: GUNHILD BAUTCH MRN: [id number] Date of Birth: 1965/05/08

## 2021-06-02 ENCOUNTER — Telehealth: Payer: Self-pay | Admitting: Physical Therapy

## 2021-06-02 ENCOUNTER — Ambulatory Visit: Payer: Medicare Other | Admitting: Physical Therapy

## 2021-06-02 ENCOUNTER — Other Ambulatory Visit: Payer: Self-pay

## 2021-06-02 DIAGNOSIS — M25561 Pain in right knee: Secondary | ICD-10-CM | POA: Diagnosis not present

## 2021-06-02 DIAGNOSIS — M25661 Stiffness of right knee, not elsewhere classified: Secondary | ICD-10-CM

## 2021-06-02 DIAGNOSIS — R262 Difficulty in walking, not elsewhere classified: Secondary | ICD-10-CM

## 2021-06-02 DIAGNOSIS — M6281 Muscle weakness (generalized): Secondary | ICD-10-CM

## 2021-06-02 DIAGNOSIS — G8929 Other chronic pain: Secondary | ICD-10-CM

## 2021-06-02 NOTE — Therapy (Signed)
[facility] [address] [place], [address] Phone: [phone]   Fax:  [phone]  Physical Therapy Treatment  Patient Details  Name: SHAHIDA SCHNACKENBERG MRN: [id number] Date of Birth: [birth year] Referring Provider (PT): Marlana Latus, MD   Encounter Date: 06/02/2021   PT End of Session - 06/02/21 1518     Visit Number 11    Number of Visits 24    Date for PT Re-Evaluation 07/21/21    Authorization Type [employer] reporting period from 05/28/2021    Progress Note Due on Visit 10    PT Start Time 1348    PT Stop Time 1435    PT Time Calculation (min) 47 min    Activity Tolerance Patient limited by pain    Behavior During Therapy [facility] for tasks assessed/performed             Past Medical History:  Diagnosis Date   Anxiety    Chronic back pain    H/O degenerative disc disease    Hypertension    Sciatica     No past surgical history on file.  There were no vitals filed for this visit.   Subjective Assessment - 06/02/21 1350     Subjective Patient reports she has 7/10 pain in her right knee upon arrival. States it has been aching like a toothache since last night. She states her L glute/hip/back pain continues to bother her and she rates it currently at 7/10. She states she felt okay after last PT session. Her HEP is going "pretty good."    Patient is accompained by: Family member    Pertinent History [name] who presents to outpatient physical therapy with a referral for medical diagnosis status post right total knee replacement. This patient's chief complaints consist of right knee pain and stiffness following R TKA on 04/09/2021 leading to the following functional deficits: difficulty with mobility including bed mobility, transfers, household and community ambulation, stairs; completing ADLs and IADLs such as cooking and cleaning..  Relevant past medical  history and comorbidities include chronic back pain (does intermittently go down both legs), anxiety, sleep apnea, left hip fracture (2018 still bothers her, non surgical), bilateral foot pain, microcytic hypochromatic anemia, bilateral knee pain, obesity.  Patient denies hx of cancer, stroke, seizures, lung problem, major cardiac events, diabetes, unexplained weight loss, changes in bowel or bladder problems, new onset stumbling or dropping things, spinal surgery, left hip surgery.    Limitations Sitting;House hold activities;Lifting;Standing;Walking;Other (comment)   mobility including bed mobility, transfers, household and community ambulation, stairs; completing ADLs and IADLs such as cooking and cleaning.   Patient Stated Goals "get up out of physical therapy and walk right"    Currently in Pain? Yes    Pain Score 7                   OBJECTIVE  ACCESSORY MOTION - LAD through left hip relieves pain during but not after.     FUNCTIONAL TESTING - 6 Minute Walk Test: 6283 with RW. Limited by left glute/hip pain   TREATMENT:  Date of surgery 04/09/2021: R TKA Time since surgery: 7 weeks, 5 days (06/01/2021);    Therapeutic exercise: to centralize symptoms and improve ROM, strength, muscular endurance, and activity tolerance required for successful completion of functional activities.  - ambulation around clinic for distance in 6 minutes (see 6MWT above) with RW. For improved lower extremity  mobility, muscular endurance, and weightbearing activity tolerance; and to induce the analgesic effect of aerobic exercise, stimulate improved joint nutrition, and prepare body structures and systems for following interventions.  - sit <> stand with no UE support from 23 inch plinth, 3x10. Limited in first set by sharp pain in left hip that improved with more sets.  - standing R knee flexion stretch step standing on 2nd stair with B UE support on handrails. 1x11, on 12 inch step braced against stairs  with B UE support, 1x15 with 5 second holds (less painful at left hip).  - step up to 6 inch step with B UE support at base of stairs, 2x10 each side. (Limited by L hip pain).     Manual therapy: to reduce pain and tissue tension, improve range of motion, neuromodulation, in order to promote improved ability to complete functional activities. HOOKLYING - R knee scar massage over closed portions of scar - R patellar mobilizations grade III-IV in all directions, knee slightly flexed  - R knee PROM extension with OP, 1x5 with 10 second holds (L hip pain prevented continued reps) - LAD through left hip 1x30-40 seconds relieves pain during but not after.    - R tibiofemoral joint mobilizations grade IV: AP in 80 degrees flexion, 3x30 seconds.   Pt required multimodal cuing for proper technique and to facilitate improved neuromuscular control, strength, range of motion, and functional ability resulting in improved performance and form.     HOME EXERCISE PROGRAM Access Code: [email or web address] URL: [email or web address] Date: 06/02/2021 Prepared by: Rosita Kea  Exercises Seated Knee Flexion Stretch - 3 x daily - 2 sets - 10 reps - 10 seconds hold Seated Long Arc Quad - 3 x daily - 3 sets - 10 reps - 5 seconds hold Supine Active Straight Leg Raise - 1 x daily - 3 sets - 5 reps Squat with Counter Support - 1 x daily - 3 sets - 10 reps Standing Knee Flexion Stretch on Step - 1-3 x daily - 3 sets - 10 reps - 10 seconds hold Forward Step Up with Counter Support - 1 x daily - 2-3 sets - 10 reps  Patient Education Scar Massage   Patient tol   PT Education - 06/02/21 1520     Education Details Exercise purpose/form. Self management techniques.    Person(s) Educated Patient    Methods Explanation;Demonstration;Tactile cues;Verbal cues    Comprehension Verbalized understanding;Returned demonstration;Verbal cues required;Tactile cues required;Need further instruction              PT  Short Term Goals - 04/30/21 1732       PT SHORT TERM GOAL #1   Title Be independent with initial home exercise program for self-management of symptoms.    Baseline Initial HEP provided at IE (04/28/2021);    Time 2    Period Weeks    Status Achieved    Target Date 05/12/21               PT Long Term Goals - 05/28/21 2022       PT LONG TERM GOAL #1   Title Be independent with a long-term home exercise program for self-management of symptoms.    Baseline Initial HEP provided at IE (04/28/2021); patient reports good participation (05/28/2021);    Time 12    Period Weeks    Status Partially Met   TARGET DATE FOR ALL LONG TERM GOALS: 07/21/2021     PT LONG TERM GOAL #2  Title Demonstrate improved FOTO to equal or greater than 60 by visit #14 to demonstrate improvement in overall condition and self-reported functional ability.    Baseline 42 (04/28/2021); 58 at visit #10 (05/28/2021);    Time 12    Period Weeks    Status Partially Met      PT LONG TERM GOAL #3   Title Paitent will demonstrate R knee PROM equal or greater than 0-115 degrees to improve ability to step over objects, get up from a chair, and use the stairs.    Baseline 0-5-80 (04/28/2021); 0-7-115 (05/28/2021);    Time 12    Period Weeks    Status Partially Met      PT LONG TERM GOAL #4   Title Patient will travel 1000 feet or more on 6 Minute Walk Test with LRAD to demonstrate improved community mobility.    Baseline to be tested visit 2 as appropriate (04/28/2021); 1079 with RW improved buttock pain (04/30/2021); 1069 with RW, Difficulty due to left glute/hip pain (05/28/2021);    Time 12    Period Weeks    Status On-going      PT LONG TERM GOAL #5   Title Complete community, work and/or recreational activities without limitation due to current condition.    Baseline Functional Limitations: difficulty with mobility including bed mobility, transfers, household and community ambulation, stairs; completing ADLs and  IADLs such as cooking and cleaning (04/28/2021); improved bed mobility, household and community ambulation, stairs but still has difficulty, is also limited by left glute/back/hip pain (05/28/2021);    Time 12    Period Weeks    Status Partially Met      PT LONG TERM GOAL #6   Title Patient will complete 5 Time Sit to Stand Test in equal or less than 15 seconds from 18 inch plinth without use of B UE to demonstrate improved LE strength and power to improve household and community mobility and demonstrate decreased fall risk.    Baseline to be tested visit 2 as appropriate - currently needs B UE support to transfer sit <> stand (04/28/2021); 16 seconds from 18.5 inch plinth with B UE support and RW in front for safety (04/30/2021); 2 seconds from 18.5 inch plinth with B UE support. Difficulty due to left glute/hip pain. Unable to get up without using BUE due to knee weakness (05/28/2021);    Time 12    Period Weeks    Status Partially Met                   Plan - 06/02/21 1518     Clinical Impression Statement Patient tolerated treatment with some difficulty due to left hip pain that was most limiting when she was unable to continue exercises. Left hip pain was temporarily relieved by LAD through the left hip, but returned immediately when tension was released. Call placed by PT to Dr. Felizardo Hoffmann office inquiring about adding left hip to PT order so it can be addressed if he agrees. Plan to evaluate left hip pain next session if order is received. Patient continues to progress with rehab for R knee. Also noted that buckling patient experiences on the left knee is into hyperextension. Patient continues to use RW due to left knee instability, left hip pain, and stooped posture. Patient would benefit from continued management of limiting condition by skilled physical therapist to address remaining impairments and functional limitations to work towards stated goals and return to PLOF or maximal  functional independence.  Personal Factors and Comorbidities Fitness;Time since onset of injury/illness/exacerbation;Comorbidity 3+;Past/Current Experience;Transportation    Comorbidities chronic back pain (does intermittently go down both legs), anxiety, sleep apnea, left hip fracture (2018 still bothers her, non surgical), bilateral foot pain, microcytic hypochromatic anemia, bilateral knee pain, obesity    Examination-Activity Limitations Bed Mobility;Lift;Stairs;Squat;Stand;Locomotion Level;Carry;Sit;Sleep;Dressing;Transfers    Examination-Participation Restrictions Cleaning;Shop;Laundry;Meal Prep;Community Activity;Interpersonal Relationship    Stability/Clinical Decision Making Stable/Uncomplicated    Rehab Potential Good    PT Frequency 2x / week    PT Duration 12 weeks    PT Treatment/Interventions ADLs/Self Care Home Management;Aquatic Therapy;Cryotherapy;Electrical Stimulation;Moist Heat;Gait training;Stair training;Functional mobility training;DME Instruction;Therapeutic activities;Therapeutic exercise;Balance training;Neuromuscular re-education;Manual techniques;Dry needling;Passive range of motion;Energy conservation;Patient/family education    PT Next Visit Plan quad strengthening, ROM, balance, functional strengthening    PT Home Exercise Plan Medbridge Access Code: [email or web address]    Consulted and Agree with Plan of Care Patient             Patient will benefit from skilled therapeutic intervention in order to improve the following deficits and impairments:  Abnormal gait, Decreased skin integrity, Increased fascial restricitons, Impaired sensation, Improper body mechanics, Pain, Postural dysfunction, Increased muscle spasms, Decreased scar mobility, Decreased mobility, Decreased coordination, Decreased activity tolerance, Decreased endurance, Decreased range of motion, Decreased strength, Hypomobility, Impaired perceived functional ability, Decreased balance, Difficulty walking,  Increased edema, Obesity  Visit Diagnosis: Chronic pain of right knee  Stiffness of right knee, not elsewhere classified  Muscle weakness (generalized)  Difficulty in walking, not elsewhere classified     Problem List Patient Active Problem List   Diagnosis Date Noted   Class 2 obesity 06/13/2018   Arthritis of right knee 01/31/2018   DDD (degenerative disc disease), lumbar 01/31/2018   Pain medication agreement signed 01/31/2018   Chronic back pain 01/02/2018   Pain in both feet 08/31/2017   Knee pain, bilateral 07/05/2017   Depression 06/07/2017   OSA (obstructive sleep apnea) 05/12/2017   Closed nondisplaced fracture of anterior wall of left acetabulum ([place]) 08/27/2016   Localized osteoarthritis of right knee 08/27/2016   Closed fracture of posterior wall of left acetabulum ([place]) 08/23/2016   HTN (hypertension) 06/21/2016   Allergic rhinitis 12/06/2013   Microcytic hypochromic anemia 12/06/2013    Clarise Cruz R. Graylon Good, PT, DPT 06/02/21, 3:20 PM   [facility] [address], [place], [address] Phone: [phone]   Fax:  [phone]  Name: CARNETTA LOSADA MRN: [id number] Date of Birth: 1966-03-17

## 2021-06-02 NOTE — Telephone Encounter (Signed)
Called Dr. Lubertha South office at [phone]. Spoke to Chi Chi who is an Advertising account executive. She took a message to send to Dr. Lubertha South team asking about a PT referral for left hip if he agrees due to worsening pain and limitations in that region. She took down clinic phone and fax and said most likely they would respond tomorrow.   Ariel Davis. Ilsa Iha, PT, DPT 06/02/21, 3:14 PM

## 2021-06-08 ENCOUNTER — Ambulatory Visit: Payer: Medicare Other | Attending: Orthopedic Surgery | Admitting: Physical Therapy

## 2021-06-08 DIAGNOSIS — M6281 Muscle weakness (generalized): Secondary | ICD-10-CM | POA: Insufficient documentation

## 2021-06-08 DIAGNOSIS — M25552 Pain in left hip: Secondary | ICD-10-CM | POA: Insufficient documentation

## 2021-06-08 DIAGNOSIS — M5442 Lumbago with sciatica, left side: Secondary | ICD-10-CM | POA: Insufficient documentation

## 2021-06-08 DIAGNOSIS — M25561 Pain in right knee: Secondary | ICD-10-CM | POA: Insufficient documentation

## 2021-06-08 DIAGNOSIS — M25661 Stiffness of right knee, not elsewhere classified: Secondary | ICD-10-CM | POA: Insufficient documentation

## 2021-06-08 DIAGNOSIS — G8929 Other chronic pain: Secondary | ICD-10-CM | POA: Insufficient documentation

## 2021-06-08 DIAGNOSIS — R262 Difficulty in walking, not elsewhere classified: Secondary | ICD-10-CM | POA: Insufficient documentation

## 2021-06-10 ENCOUNTER — Other Ambulatory Visit: Payer: Self-pay

## 2021-06-10 ENCOUNTER — Telehealth: Payer: Self-pay | Admitting: Physical Therapy

## 2021-06-10 ENCOUNTER — Ambulatory Visit: Payer: Medicare Other | Admitting: Physical Therapy

## 2021-06-10 ENCOUNTER — Encounter: Payer: Self-pay | Admitting: Physical Therapy

## 2021-06-10 DIAGNOSIS — R262 Difficulty in walking, not elsewhere classified: Secondary | ICD-10-CM

## 2021-06-10 DIAGNOSIS — M6281 Muscle weakness (generalized): Secondary | ICD-10-CM

## 2021-06-10 DIAGNOSIS — G8929 Other chronic pain: Secondary | ICD-10-CM | POA: Diagnosis present

## 2021-06-10 DIAGNOSIS — M25552 Pain in left hip: Secondary | ICD-10-CM

## 2021-06-10 DIAGNOSIS — M5442 Lumbago with sciatica, left side: Secondary | ICD-10-CM | POA: Diagnosis present

## 2021-06-10 DIAGNOSIS — M25661 Stiffness of right knee, not elsewhere classified: Secondary | ICD-10-CM | POA: Diagnosis present

## 2021-06-10 DIAGNOSIS — M25561 Pain in right knee: Secondary | ICD-10-CM | POA: Diagnosis not present

## 2021-06-10 NOTE — Telephone Encounter (Signed)
Called patient at ~ 1:30pm on 06/08/2021 when she did not show up for her appointment scheduled at 1pm on 06/08/2021. She answered and said she thought her appointment was at 1:45pm. She was unable to reschedule and confirmed her appointment on Wed 06/10/2021 at 1:45pm.   ? ?Ariel Davis. Ilsa Iha, PT, DPT ?06/10/21, 1:06 PM ? ?

## 2021-06-10 NOTE — Therapy (Signed)
[facility] [address] [place], [address] Phone: [phone]   Fax:  [phone]  Physical Therapy Re-Evaluation Reason for re-evaluation: patient has a change in status from a new PT order that includes new body part to address.  Patient Details  Name: Ariel Davis MRN: [id number] Date of Birth: 14-May-1965 Referring Provider (PT): Marlana Latus, MD   Encounter Date: 06/10/2021   PT End of Session - 06/11/21 2034     Visit Number 12    Number of Visits 24    Date for PT Re-Evaluation 09/02/21    Authorization Type [employer] MEDICARE reporting period from 05/28/2021    Progress Note Due on Visit 10    PT Start Time 1347    PT Stop Time 1430    PT Time Calculation (min) 43 min    Activity Tolerance Patient limited by pain    Behavior During Therapy [facility] for tasks assessed/performed             Past Medical History:  Diagnosis Date   Anxiety    Chronic back pain    H/O degenerative disc disease    Hypertension    Sciatica     History reviewed. No pertinent surgical history.  There were no vitals filed for this visit.   Subjective Assessment - 06/10/21 1948     Subjective Patient has a new PT referral today from her referring clinican to address L hip and low back pain and patinet is willing to do a re-evaluation to examine this condition. Patient reports she has been doing pretty good. She ran out of her celecoxib medication yesterday and she was able to get three day's supply so she could tolerate PT. R knee pain is pretty good and doesn't hurt unless she stretches. She has been doing her HEP. PT received referral to address left hip/low back pain and patient is willing to further examine that today.  She states she has pain in her left hip/back that is bothering her the most. She has had pain there ever since she hurt herself many years ago. She had back pain before that  had come on over time that but then it really hurt in the left hip region when she fell close to 08/2016. She had a documented closed nondisplaced fracture of the anterior and posterior wall of the left acetabulum at that time. She was about to have surgery and she was prepped and then came and canceled surgery due to her age (being too old). 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 Her back and left hip has been her primary source of pain recently and is inhibiting her ability to complete rehab for R TKA. Her back pain has been managed by a pain clinic. She has been using a RW for mobility since she started having back problems piror to her fall that injured the left hip. She states the reason for the walker is to help keep her posture more upright.  She states her back pain caused pain down both legs in the past. Her last available Lumbar MRI report is from 10/07/2017 and notes. "IMPRESSION:  -Multilevel lumbar spondylosis, with severe canal stenosis at L4-L5 and mild-to-moderate neural foraminal narrowing, unchanged from prior exam on 09/10/2015." Last documented left hip xray report from 05/24/2019 reports "FINDINGS: There is no evidence of hip fracture or  dislocation. There is no  evidence of arthropathy or other focal bone abnormality. IMPRESSION:  Negative."   She used to lay on her stomach prior to knee surgery and loved this position.    Patient is accompained by: Family member    Pertinent History [name] who presents to outpatient physical therapy with a referral for medical diagnosis status post right total knee replacement, low back pain, L hip pain. This patient's chief complaints consist of right knee pain and stiffness following R TKA on 04/09/2021 as well as acute worsening of left low back and hip pain following TKA leading to the following functional deficits: difficulty with  mobility including bed mobility, transfers, household and community ambulation, stairs; completing ADLs and IADLs such as cooking and cleaning; makes her unhappy because she "cannot do anything."  Relevant past medical history and comorbidities include chronic back pain (does intermittently go down both legs), anxiety, sleep apnea, left hip fracture (2018 still bothers her, non surgical), bilateral foot pain, microcytic hypochromatic anemia, bilateral knee pain, obesity.  Patient denies hx of cancer, stroke, seizures, lung problem, major cardiac events, diabetes, unexplained weight loss, changes in bowel or bladder problems, new onset stumbling or dropping things, spinal surgery, left hip surgery.    Limitations Sitting;House hold activities;Lifting;Standing;Walking;Other (comment)   mobility including bed mobility, transfers, household and community ambulation, stairs; completing ADLs and IADLs such as cooking and cleaning.   Patient Stated Goals "get up out of physical therapy and walk right"    Currently in Pain? Yes    Pain Score 7    W: 8/10; B: 6/10   Pain Location Hip   Currently left hip/glute pain is most prominent over the left pelvis near SIJ (uses whole hand to demonstrate). It radiates over the lateral hip over the anterior thigh and into the anterior groin. Feels tingly over the anterior thigh and is painful.   Pain Orientation Left    Pain Descriptors / Indicators Tingling;Aching    Pain Type Chronic pain    Pain Radiating Towards left anterior thigh and left groin    Pain Onset More than a month ago    Pain Frequency Constant    Aggravating Factors  walking, step standing on stairs with R leg forward and L back (hip extension), standing, cramping when she lifts her legs up when laying on her back.    Pain Relieving Factors lay down flat on her back with legs extended, laying left knee on a pillow while on her back, sitting down, long axis distraction through the left hip    Effect of  Pain on Daily Activities makes her unhappy because she cannot do anything, she cannot complete her tasks such as cleaning, yardwork, hobbies, ambulation, shopping,             [facility] PT Assessment - 06/11/21 0001       Assessment   Medical Diagnosis low back pain, L hip pain    Referring Provider (PT) Marlana Latus, MD    Onset Date/Surgical Date 04/09/21    Hand Dominance Right    Prior Therapy home health PT      Precautions   Precautions Fall      Balance Screen   Has the patient fallen in the past 6 months No    Has the patient had a decrease in activity level because of a fear of falling?  No    Is the patient reluctant to leave their home because  of a fear of falling?  No      Home Environment   Living Environment Private residence   no concerns about getting around home.   Living Arrangements Other relatives      Prior Function   Level of Independence Requires assistive device for independence;Independent      Cognition   Overall Cognitive Status Within Functional Limits for tasks assessed              OBJECTIVE  OBSERVATION/INSPECTION Posture Posture (seated): forward head, rounded shoulders, slumped in sitting.  Posture (standing): upper body flexed forward, hands on walker, L knee genuvalgum.  Anthropometrics Tremor: none Body composition: BMI 37.1 Muscle bulk: WFL Edema: moderate B LE Functional Mobility Bed mobility: supine <> sit and rolling mod I for increased time with increased effort due to pain.  Transfers: sit <> stand mod I with use of B UE and RW in front Gait: mod I household and short community ambulation with RW. Is limited in distance by L low back and hip pain.   SPINE MOTION  Lumbar Spine AROM *Indicates pain Flexion: fingers on distal third of thigh, severe pain in back, pulling  Extension: 0 pain in middle low back Discontinued AROM exam of spine due to severe pain at low back  NEUROLOGICAL Dermatomes L2-S2  appears equal and intact to light touch  PERIPHERAL JOINT MOTION (in degrees)  Passive Range of Motion (PROM) Comments:  06/10/2021: B LE appears WFL except restricted R knee flexion and  limitation in left hip extension to approx 5 degrees.   MUSCLE PERFORMANCE (MMT):  *Indicates pain 06/10/21 Date Date  Joint/Motion R/L R/L R/L  Hip     Flexion (L1, L2) 4/4* / /  Extension (knee ext) 4/4 / /  Extension (knee flex) / / /  Abduction 3+/3* / /  Knee     Extension (L3) 5/5 / /  Flexion (S2) 5/5 / /  Ankle/Foot     Dorsiflexion (L4) 5/5 / /  Great toe extension (L5) 5/5 / /  Eversion (S1) 5/5 / /  Plantarflexion (S1) 4/4 / /    SPECIAL TESTS:  SIJ SPECIAL TESTS SIJ distraction: R = negative, L = negative. SIJ compression: negative. Thigh thrust: R = negative, L = negative. Sacral thrust: R = negative, L = negative.  LOWER LIMB NEURODYNAMIC TESTS Straight Leg Raise (Sciatic nerve)  R  = negative  L  = negative, pain at anterior hip at ~60 degrees  HIP SPECIAL TESTS Thigh thrust: R = negative, L = negative. FABER: R = negative, L = positive anterior hip and groin pain. FADDIR: R = negative, L = inconsistently positive for groin pain SCOUR: R = negative, L = negative Fitzgerald Test:  inconsistently positive for pain on left   SUSTAINED POSTURE Prone on elbows for several minutes. Hurts at first but gets better with time.   ACCESSORY MOTION:  CPA to lower lumbar spine reproductes left glute pain and localized lumbar pain.  LAD through left hip relieves pain during  but not after.   PALPATION: TTP lumbar paraspinals Inconsistently TTP at left greater trochanter   HOME EXERCISE PROGRAM Access Code: [email or web address] URL: [email or web address] Date: 06/11/2021 Prepared by: Rosita Kea  Exercises Seated Knee Flexion Stretch - 3 x daily - 2 sets - 10 reps - 10 seconds hold Seated Long Arc Quad - 3 x daily - 3 sets - 10 reps - 5 seconds hold Supine Active  Straight Leg Raise - 1 x  daily - 3 sets - 5 reps Squat with Counter Support - 1 x daily - 3 sets - 10 reps Standing Knee Flexion Stretch on Step - 1-3 x daily - 3 sets - 10 reps - 10 seconds hold Forward Step Up with Counter Support - 1 x daily - 2-3 sets - 10 reps Lying Prone - 2 x daily - 5 minutes hold  Patient Education Scar Massage      PT Education - 06/11/21 2033     Education Details Education on diagnosis, prognosis, POC, anatomy and physiology of current condition    Person(s) Educated Patient    Methods Explanation    Comprehension Verbalized understanding;Need further instruction              PT Short Term Goals - 04/30/21 1732       PT SHORT TERM GOAL #1   Title Be independent with initial home exercise program for self-management of symptoms.    Baseline Initial HEP provided at IE (04/28/2021);    Time 2    Period Weeks    Status Achieved    Target Date 05/12/21               PT Long Term Goals - 06/11/21 2056       PT LONG TERM GOAL #1   Title Be independent with a long-term home exercise program for self-management of symptoms.    Baseline Initial HEP provided at IE (04/28/2021); patient reports good participation (05/28/2021); added exercises for low back/L hip (06/10/2021);    Time 12    Period Weeks    Status Partially Met   TARGET DATE FOR ALL LONG TERM GOALS: 07/21/2021. TARGET DATE UPDATED to 09/02/2021 for all unmet/new goals     PT LONG TERM GOAL #2   Title Demonstrate improved knee FOTO to equal or greater than 60 by visit #14 to demonstrate improvement in overall condition and self-reported functional ability.    Baseline 42 (04/28/2021); 56 at visit #10 (05/28/2021);    Time 12    Period Weeks    Status Revised      PT LONG TERM GOAL #3   Title Paitent will demonstrate R knee PROM equal or greater than 0-115 degrees to improve ability to step over objects, get up from a chair, and use the stairs.    Baseline 0-5-80 (04/28/2021); 0-7-115  (05/28/2021);    Time 12    Period Weeks    Status Partially Met      PT LONG TERM GOAL #4   Title Patient will travel 1200 feet or more on 6 Minute Walk Test with LRAD to demonstrate improved community mobility.    Baseline to be tested visit 2 as appropriate (04/28/2021); 1079 with RW improved buttock pain (04/30/2021); 1069 with RW, Difficulty due to left glute/hip pain (05/28/2021);    Time 12    Period Weeks    Status Revised      PT LONG TERM GOAL #5   Title Complete community, work and/or recreational activities without limitation due to current condition.    Baseline Functional Limitations: difficulty with mobility including bed mobility, transfers, household and community ambulation, stairs; completing ADLs and IADLs such as cooking and cleaning (04/28/2021); improved bed mobility, household and community ambulation, stairs but still has difficulty, is also limited by left glute/back/hip pain (05/28/2021); has severe difficulty with mobility, ADLs, and IADLs due to left hip/low back pain (06/10/2021);    Time 12    Period Weeks  Status Revised      Additional Long Term Goals   Additional Long Term Goals Yes      PT LONG TERM GOAL #6   Title Patient will complete 5 Time Sit to Stand Test in equal or less than 15 seconds from 18 inch plinth without use of B UE to demonstrate improved LE strength and power to improve household and community mobility and demonstrate decreased fall risk.    Baseline to be tested visit 2 as appropriate - currently needs B UE support to transfer sit <> stand (04/28/2021); 16 seconds from 18.5 inch plinth with B UE support and RW in front for safety (04/30/2021); 2 seconds from 18.5 inch plinth with B UE support. Difficulty due to left glute/hip pain. Unable to get up without using BUE due to knee weakness (05/28/2021);    Time 12    Period Weeks    Status Revised      PT LONG TERM GOAL #7   Title Reduce pain to equal or less than 1/10 with functional  activities to allow patient to complete valued functional tasks such as transfers, ambulation, community participation,  cleaning, etc.  with  less difficulty.    Baseline up to 8/10 (06/10/2021);    Time 12    Period Weeks    Status New                   Plan - 06/11/21 2053     Clinical Impression Statement Patient is a 57 y.o. female referred to outpatient physical therapy with a medical diagnosis of right total knee replacement, low back pain, L hip pain who presents with signs and symptoms consistent with right knee pain, stiffness, and weakness s/p TKA on 04/09/2021 as well as acute on chronic left sided low back and let hip pain. Re-evaluation performed today to address left low back and hip pain. Findings suggestive of L hip intra-articular contribution to pain and/or low back pain with referral to L hip region. Hip specific special tests and relief from long axis distraction suggest intraarticular hip pathology such as labral tear or osteoarthritis. Pressure to the low back failed to reproduce anterior L hip pain but did reproduce posterior L glute pain. R knee not specifically examined today due to recent progress note focused on the right knee two visits ago. However, patient is currently most functionally limited by left low back/hip pain and dysfunction. Patient presents with significant balance, pain, ROM, joint stiffness, motor control, muscle tension, muscle performance (strength/power/endurance) and activity tolerance impairments that are limiting ability to complete her usual activities and mobility including bed mobility, transfers, household and community ambulation, stairs; completing ADLs and IADLs such as cooking and cleaning without difficulty and without risk for falls. Patient's rehab for R TKA is hindered by left low back/hip pain. Patient has not been able to transition off the RW due to limited activity tolerance at left hip/low back. Patient's QOL is significantly reduced  due to her current conditions.  Patient will benefit from skilled physical therapy intervention to address current body structure impairments and activity limitations to improve function and work towards goals set in current POC in order to return to prior level of function or maximal functional improvement.    Personal Factors and Comorbidities Fitness;Time since onset of injury/illness/exacerbation;Comorbidity 3+;Past/Current Experience;Transportation    Comorbidities chronic back pain (does intermittently go down both legs), anxiety, sleep apnea, left hip fracture (2018 still bothers her, non surgical), bilateral foot pain, microcytic hypochromatic  anemia, bilateral knee pain, obesity    Examination-Activity Limitations Bed Mobility;Lift;Stairs;Squat;Stand;Locomotion Level;Carry;Sit;Sleep;Dressing;Transfers    Examination-Participation Restrictions Cleaning;Shop;Laundry;Meal Prep;Community Activity;Interpersonal Relationship;[place] [employer] Work;Driving   makes her unhappy because she "cannot do anything" she cannot complete her tasks such as cleaning, yardwork, hobbies, ambulation, shopping,   Stability/Clinical Decision Making Evolving/Moderate complexity    Clinical Decision Making Moderate    Rehab Potential Good    PT Frequency 2x / week    PT Duration 12 weeks    PT Treatment/Interventions ADLs/Self Care Home Management;Aquatic Therapy;Cryotherapy;Electrical Stimulation;Moist Heat;Gait training;Stair training;Functional mobility training;DME Instruction;Therapeutic activities;Therapeutic exercise;Balance training;Neuromuscular re-education;Manual techniques;Dry needling;Passive range of motion;Energy conservation;Patient/family education;Joint Manipulations;Spinal Manipulations    PT Next Visit Plan pain control, quad strengthening, ROM, balance, functional strengthening, hip and core strengthening, repeated movements.    PT Home Exercise Plan Medbridge Access Code: [email or web address] prone lying 5 min 2x a day     Consulted and Agree with Plan of Care Patient             Patient will benefit from skilled therapeutic intervention in order to improve the following deficits and impairments:  Abnormal gait, Decreased skin integrity, Increased fascial restricitons, Impaired sensation, Improper body mechanics, Pain, Postural dysfunction, Increased muscle spasms, Decreased scar mobility, Decreased mobility, Decreased coordination, Decreased activity tolerance, Decreased endurance, Decreased range of motion, Decreased strength, Hypomobility, Impaired perceived functional ability, Decreased balance, Difficulty walking, Increased edema, Obesity  Visit Diagnosis: Chronic pain of right knee  Stiffness of right knee, not elsewhere classified  Muscle weakness (generalized)  Difficulty in walking, not elsewhere classified  Chronic left-sided low back pain with left-sided sciatica  Pain in left hip     Problem List Patient Active Problem List   Diagnosis Date Noted   Class 2 obesity 06/13/2018   Arthritis of right knee 01/31/2018   DDD (degenerative disc disease), lumbar 01/31/2018   Pain medication agreement signed 01/31/2018   Chronic back pain 01/02/2018   Pain in both feet 08/31/2017   Knee pain, bilateral 07/05/2017   Depression 06/07/2017   OSA (obstructive sleep apnea) 05/12/2017   Closed nondisplaced fracture of anterior wall of left acetabulum ([place]) 08/27/2016   Localized osteoarthritis of right knee 08/27/2016   Closed fracture of posterior wall of left acetabulum ([place]) 08/23/2016   HTN (hypertension) 06/21/2016   Allergic rhinitis 12/06/2013   Microcytic hypochromic anemia 12/06/2013    Clarise Cruz R. Graylon Good, PT, DPT 06/11/21, 9:06 PM  [facility] [address], [place], [address] Phone: [phone]   Fax:  [phone]  Name: Ariel Davis MRN: [id number] Date of Birth: [birth year]

## 2021-06-10 NOTE — Therapy (Signed)
[place] ?[facility] ?[address] ?[place], [place], [address] ?Phone: [phone]   Fax:  [phone] ? ?Patient Details  ?Name: Ariel Davis ?MRN: [id number] ?Date of Birth: 02/01/1966 ?Referring Provider:  Carola Rhine, MD ? ?Encounter Date: 06/08/2021 ? ? ?Encounter created in error. Patient did not attend PT on 06/08/2021.  ? ?Luretha Murphy. Ilsa Iha, PT, DPT ?06/10/21, 8:01 PM ? ?[place] ?[facility] ?[address] ?[place], [place], [address] ?Phone: [phone]   Fax:  [phone] ?

## 2021-06-10 NOTE — Therapy (Deleted)
[facility] [address] [place], [address] Phone: [phone]   Fax:  [phone]  Physical Therapy Treatment  Patient Details  Name: Ariel Davis MRN: [id number] Date of Birth: 01-03-1966 Referring Provider (PT): Marlana Latus, MD   Encounter Date: 06/08/2021    Past Medical History:  Diagnosis Date   Anxiety    Chronic back pain    H/O degenerative disc disease    Hypertension    Sciatica     No past surgical history on file.  There were no vitals filed for this visit.     Patient reports she has been doing pretty good. She ran out of her celecoxib medication yesterday and she was able to get three day's supply so she could tolerate PT. R knee pain is pretty good and doesn't hurt unless she stretches. She has been doing her HEP. PT received referral to address left hip/low back pain and patient is willing to further examine that today.  She states she has pain in her left hip/back that is bothering her the most. She has had pain there ever since she hurt herself many years ago. She had back pain before that had come on over time that but then it really hurt in the left hip region when she fell close to 08/2016. She had a documented closed nondisplaced fracture of the anterior and posterior wall of the left acetabulum at that time. She was about to have surgery and she was prepped and then came and canceled surgery due to her age (being too old). 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 Her back and left hip has been her primary source of pain recently and is inhibiting her ability to complete rehab for R TKA. Her back pain has been managed by a pain clinic. She has been using a RW for mobility since she started having back problems piror to her fall that injured the left hip. She states the  reason for the walker is to help keep her posture more upright.  She states her back pain caused pain down both legs in the past. Her last available Lumbar MRI report is from 10/07/2017 and notes. "IMPRESSION:  -Multilevel lumbar spondylosis, with severe canal stenosis at L4-L5 and mild-to-moderate neural foraminal narrowing, unchanged from prior exam on 09/10/2015." Last documented left hip xray report from 05/24/2019 reports "FINDINGS: There is no evidence of hip fracture or dislocation. There is no evidence of arthropathy or other focal bone abnormality. IMPRESSION: Negative."   She used to lay on her stomach prior to knee surgery.   Currently left hip/glute pain is most prominent over the left pelvis near SIJ (grabs with whole hand). It radiates over the lateral hip over the anterior thigh and into the anterior groin. Feels tingly over the anterior thigh and is painful.  Agg: walking, step standing on stairs with R leg forward and L back (hip extension, standing, cramping when she lifts her legs up when laying on her back.  Ease: lay down flat on her back, laying left knee on a pillow while on her back, sitting down  Functional limitations: makes her unhappy because she cannot do anything, she cannot complete her tasks such as cleaning, yardwork, hobbies, ambulation, shopping,      OBJECTIVE  SELF- REPORTED FUNCTION FOTO score: ***/100 (*** questionnaire)  OBSERVATION/INSPECTION Posture Posture (seated): forward  head, rounded shoulders, slumped in sitting.  Posture (standing): *** Posture correction: *** Anthropometrics Tremor: none Body composition: *** Muscle bulk: *** Skin: The incision sites appear to be healing well with no excessive redness, warmth, drainage or signs of infection present.  *** Edema: *** Functional Mobility Bed mobility: *** Transfers: *** Gait: grossly WFL for household and short community ambulation. More detailed gait analysis deferred to later date as  needed. *** Stairs: ***  SPINE MOTION  Lumbar Spine AROM *Indicates pain Flexion: fingers on distal third of thigh, severe pain in back, pulling  Extension: 0 pain in middle low back Discontinued AROM exam of spine due to severe pain   NEUROLOGICAL Dermatomes L2-S2 appears equal and intact to light touch Deep Tendon Reflexes R/L  ***+/***+ Biceps brachii reflex (C5, C6) ***+/***+ Brachioradialis reflex (C6) ***+/***+ Triceps brachii reflex (C7) ***+/***+ Quadriceps reflex (L4) ***+/***+ Achilles reflex (S1)    PERIPHERAL JOINT MOTION (in degrees)  Active Range of Motion (AROM) *Indicates pain Date Date Date  Joint/Motion R/L R/L R/L  Shoulder     Flexion / / /  Extension / / /  Abduction  / / /  External rotation / / /  Internal rotation / / /  Elbow     Flexion  / / /  Extension  / / /  Wrist     Flexion / / /  Extension  / / /  Radial deviation / / /  Ulnar deviation / / /  Pronation / / /  Supination / / /  Hip     Flexion / / /  Extension  / / /  Abduction / / /  Adduction / / /  External rotation / / /  Internal rotation  / / /  Knee     Extension / / /  Flexoin / / /  Ankle/Foot     Dorsiflexion (knee ext) / / /  Dorsiflexion (knee flex) / / /  Plantarflexion / / /  Everison / / /  Inversion / / /  Great toe extension / / /  Great toe flexion / / /  Comments:   Passive Range of Motion (PROM) *Indicates pain Date Date Date  Joint/Motion R/L R/L R/L  Shoulder     Flexion / / /  Extension / / /  Abduction  / / /  External rotation / / /  Internal rotation / / /  Elbow     Flexion  / / /  Extension  / / /  Wrist     Flexion / / /  Extension  / / /  Radial deviation / / /  Ulnar deviation / / /  Pronation / / /  Supination / / /  Hip     Flexion  / / /  Extension  / / /  Abduction / / /  Adduction / / /  External rotation / / /  Internal rotation  / / /  Knee     Extension / / /  Flexoin / / /  Ankle/Foot      Dorsiflexion (knee ext) / / /  Dorsiflexion (knee flex) / / /  Plantarflexion / / /  Everison / / /  Inversion / / /  Great toe extension / / /  Great toe flexion / / /  Comments:  06/10/2021: limitation in left hip extension approx   MUSCLE PERFORMANCE (MMT):  *Indicates pain Date Date Date  Joint/Motion R/L R/L R/L  Hip     Flexion (L1, L2) 4/4* / /  Extension (knee ext) 4/4 / /  Extension (knee flex) / / /  Abduction 3+/3* / /  Adduction / / /  External rotation / / /  Internal rotation  / / /  Knee     Extension (L3) 5/5 / /  Flexion (S2) 5/5 / /  Ankle/Foot     Dorsiflexion (L4) 5/5 / /  Great toe extension (L5) 5/5 / /  Eversion (S1) 5/5 / /  Plantarflexion (S1) 4/4 / /  Comments:   SPECIAL TESTS:  SIJ SPECIAL TESTS SIJ distraction: R = negative, L = negative. SIJ compression: negative. Thigh thrust: R = negative, L = negative. Sacral thrust: R = negative, L = negative.   LOWER LIMB NEURODYNAMIC TESTS Straight Leg Raise (Sciatic nerve)  R  = negative  L  = negative, pain at anterior hip at ~60 degrees  HIP SPECIAL TESTS Thigh thrust: R = negative, L = negative. FABER: R = negative, L = positive anterior hip pain. FADDIR: R = negative, L = positive at times SCOUR: R = negative, L = negative DynamicFADDIR and FABER: positive at times on L  Prone on elbows. Hurts at first but gets better.     .Neurodynamictest  .NeurodynamicUE .NeurodynamicLE .CspineInstability .CSPINESPECIALTESTS .SHOULDERSPECIALTESTCLUSTERS .HIPSPECIALTESTS .SIJSPECIALTESTS   SHOULDER SPECIAL TESTS RTC, Impingement, Anterior Instability (macrotrauma), Labral Tear: Painful arc test: R = ***, L = ***. Drop arm test: R = ***, L = ***. Hawkins-Kennedy test: R = ***, L = ***. Infraspinatus test: R = ***, L = ***. Apprehension test: R = ***, L = ***. Relocation test: R = ***, L = ***. Active compression test: R = ***, L = ***.  ACCESSORY MOTION:  CPA to lower lumbar spine  reproductes left glute pain and localized lumbar pain.   PALPATION: TTP lumbar paraspinals  SUSTAINED POSITIONS TESTING:  ***  REPEATED MOTIONS TESTING: ***  FUNCTIONAL/BALANCE TESTS: Five Time Sit to Stand (5TSTS): @@ seconds Functional Gait Assessment (FGA): @@/30 (see details above) Ten meter walking trial (10MWT): @@ meters/second Six Minute Walk Test (6MWT): @@ feet Timed Up and Go (TUG): @_0    Dynamic Gait Index: @@/24 BERG Balance Scale: @@/56 Tinetti/POMA: @@/28 Timed Up and GO: @@ seconds (average of 3 trials) Trial 1: @@ Trial 2: @@ Trial 3: @@ Romberg test: -Narrow stance, eyes open: @@ seconds -Narrow stance, eyes closed: @@ seconds Sharpened Romberg test: -Tandem stance, eyes open: @@ seconds -Tandem stance, eyes closed: @@ seconds  Narrow stance, firm surface, eyes open: @@ seconds Narrow stance, firm surface, eyes closed: @@ seconds Narrow stance, compliant surface, eyes open: @@ seconds Narrow stance, compliant surface, eyes closed: @@ seconds Single leg stance, firm surface, eyes open: R= @@ seconds, L= @@ seconds Single leg stance, compliant surface, eyes open: R= @@ seconds, L= @@ seconds Gait speed: @@ m/s Functional reach test: @@ inches      OBJECTIVE  MUSCULOSKELETAL: Tremor: Absent Bulk: Normal Tone: Normal, no clonus  Posture  No gross abnormalities noted in standing or seated posture  Gait  No gross abnormalities in gait noted  Strength  R/L  5/5 Hip flexion  5/5 Hip external rotation  5/5 Hip internal rotation  5/5 Hip extension  5/5 Hip abduction  5/5 Hip adduction 5/5 Knee extension 5/5 Knee flexion  5/5 Ankle Plantarflexion  5/5 Ankle Dorsiflexion   NEUROLOGICAL: Mental Status Patient is oriented to person, place  and time.  Recent memory is intact.  Remote memory is intact.  Attention span and concentration are intact.  Expressive speech is intact.  Patient's fund of knowledge is within normal limits for  educational level.  Cranial Nerves Visual acuity and visual fields are intact  Extraocular muscles are intact  Facial sensation is intact bilaterally  Facial strength is intact bilaterally  Hearing is normal as tested by gross conversation Palate elevates midline, normal phonation  Shoulder shrug strength is intact  Tongue protrudes midline   Sensation Grossly intact to light touch bilateral UEs/LEs as determined by testing dermatomes C2-T2/L2-S2 respectively  Proprioception and hot/cold testing deferred on this date   Reflexes R/L 2+/2+ Knee Jerk (L3/4) 2+/2+ Ankle Jerk (S1/2)    FUNCTIONAL OUTCOME MEASURES  Results Comments  BERG /56 AD: ***; ***  DGI /24 AD: ***; ***  FGA ***/30 AD: ***; ***  TUG *** seconds AD: ***; ***  5TSTS *** seconds ***  6 Minute Walk Test *** feet AD: ***; ***  10 Meter Gait Speed Self-selected: s = m/s; Fastest: s = m/s AD: ***; ***    POSTURAL CONTROL TESTS  Modified Clinical Test of Sensory Interaction for Balance (CTSIB): CONDITION TIME STRATEGY SWAY  Eyes open, firm surface 30 *** seconds ankle   Eyes closed, firm surface 30 *** seconds ankle   Eyes open, foam surface 30 *** seconds ankle   Eyes closed, foam surface 30 *** seconds ankle        OBJECTIVE   ACCESSORY MOTION - LAD through left hip relieves pain during but not after.      FUNCTIONAL TESTING - 6 Minute Walk Test: 3570 with RW. Limited by left glute/hip pain     TREATMENT:  Date of surgery 04/09/2021: R TKA Time since surgery: 7 weeks, 5 days (06/01/2021);    Therapeutic exercise: to centralize symptoms and improve ROM, strength, muscular endurance, and activity tolerance required for successful completion of functional activities.  - ambulation around clinic for distance in 6 minutes (see 6MWT above) with RW. For improved lower extremity mobility, muscular endurance, and weightbearing activity tolerance; and to induce the analgesic effect of aerobic exercise,  stimulate improved joint nutrition, and prepare body structures and systems for following interventions.  - sit <> stand with no UE support from 23 inch plinth, 3x10. Limited in first set by sharp pain in left hip that improved with more sets.  - standing R knee flexion stretch step standing on 2nd stair with B UE support on handrails. 1x11, on 12 inch step braced against stairs with B UE support, 1x15 with 5 second holds (less painful at left hip).  - step up to 6 inch step with B UE support at base of stairs, 2x10 each side. (Limited by L hip pain).     Manual therapy: to reduce pain and tissue tension, improve range of motion, neuromodulation, in order to promote improved ability to complete functional activities. HOOKLYING - R knee scar massage over closed portions of scar - R patellar mobilizations grade III-IV in all directions, knee slightly flexed  - R knee PROM extension with OP, 1x5 with 10 second holds (L hip pain prevented continued reps) - LAD through left hip 1x30-40 seconds relieves pain during but not after.    - R tibiofemoral joint mobilizations grade IV: AP in 80 degrees flexion, 3x30 seconds.   Pt required multimodal cuing for proper technique and to facilitate improved neuromuscular control, strength, range of motion, and functional ability  resulting in improved performance and form.     HOME EXERCISE PROGRAM Access Code: [email or web address] URL: [email or web address] Date: 06/10/2021 Prepared by: Rosita Kea  Exercises Seated Knee Flexion Stretch - 3 x daily - 2 sets - 10 reps - 10 seconds hold Seated Long Arc Quad - 3 x daily - 3 sets - 10 reps - 5 seconds hold Supine Active Straight Leg Raise - 1 x daily - 3 sets - 5 reps Squat with Counter Support - 1 x daily - 3 sets - 10 reps Standing Knee Flexion Stretch on Step - 1-3 x daily - 3 sets - 10 reps - 10 seconds hold Forward Step Up with Counter Support - 1 x daily - 2-3 sets - 10 reps Lying Prone - 2 x daily -  5 minutes hold  Patient Education Scar Massage    [email or web address]                  PT Short Term Goals - 04/30/21 1732       PT SHORT TERM GOAL #1   Title Be independent with initial home exercise program for self-management of symptoms.    Baseline Initial HEP provided at IE (04/28/2021);    Time 2    Period Weeks    Status Achieved    Target Date 05/12/21               PT Long Term Goals - 05/28/21 2022       PT LONG TERM GOAL #1   Title Be independent with a long-term home exercise program for self-management of symptoms.    Baseline Initial HEP provided at IE (04/28/2021); patient reports good participation (05/28/2021);    Time 12    Period Weeks    Status Partially Met   TARGET DATE FOR ALL LONG TERM GOALS: 07/21/2021     PT LONG TERM GOAL #2   Title Demonstrate improved FOTO to equal or greater than 60 by visit #14 to demonstrate improvement in overall condition and self-reported functional ability.    Baseline 42 (04/28/2021); 58 at visit #10 (05/28/2021);    Time 12    Period Weeks    Status Partially Met      PT LONG TERM GOAL #3   Title Paitent will demonstrate R knee PROM equal or greater than 0-115 degrees to improve ability to step over objects, get up from a chair, and use the stairs.    Baseline 0-5-80 (04/28/2021); 0-7-115 (05/28/2021);    Time 12    Period Weeks    Status Partially Met      PT LONG TERM GOAL #4   Title Patient will travel 1000 feet or more on 6 Minute Walk Test with LRAD to demonstrate improved community mobility.    Baseline to be tested visit 2 as appropriate (04/28/2021); 1079 with RW improved buttock pain (04/30/2021); 1069 with RW, Difficulty due to left glute/hip pain (05/28/2021);    Time 12    Period Weeks    Status On-going      PT LONG TERM GOAL #5   Title Complete community, work and/or recreational activities without limitation due to current condition.    Baseline Functional Limitations: difficulty with mobility  including bed mobility, transfers, household and community ambulation, stairs; completing ADLs and IADLs such as cooking and cleaning (04/28/2021); improved bed mobility, household and community ambulation, stairs but still has difficulty, is also limited by left glute/back/hip pain (05/28/2021);    Time 12    Period  Weeks    Status Partially Met      PT LONG TERM GOAL #6   Title Patient will complete 5 Time Sit to Stand Test in equal or less than 15 seconds from 18 inch plinth without use of B UE to demonstrate improved LE strength and power to improve household and community mobility and demonstrate decreased fall risk.    Baseline to be tested visit 2 as appropriate - currently needs B UE support to transfer sit <> stand (04/28/2021); 16 seconds from 18.5 inch plinth with B UE support and RW in front for safety (04/30/2021); 2 seconds from 18.5 inch plinth with B UE support. Difficulty due to left glute/hip pain. Unable to get up without using BUE due to knee weakness (05/28/2021);    Time 12    Period Weeks    Status Partially Met                    Patient will benefit from skilled therapeutic intervention in order to improve the following deficits and impairments:     Visit Diagnosis: No diagnosis found.     Problem List Patient Active Problem List   Diagnosis Date Noted   Class 2 obesity 06/13/2018   Arthritis of right knee 01/31/2018   DDD (degenerative disc disease), lumbar 01/31/2018   Pain medication agreement signed 01/31/2018   Chronic back pain 01/02/2018   Pain in both feet 08/31/2017   Knee pain, bilateral 07/05/2017   Depression 06/07/2017   OSA (obstructive sleep apnea) 05/12/2017   Closed nondisplaced fracture of anterior wall of left acetabulum ([place]) 08/27/2016   Localized osteoarthritis of right knee 08/27/2016   Closed fracture of posterior wall of left acetabulum ([place]) 08/23/2016   HTN (hypertension) 06/21/2016   Allergic rhinitis 12/06/2013    Microcytic hypochromic anemia 12/06/2013    Nancy Nordmann, PT 06/10/2021, 1:52 PM  [facility] [address] [place], [address] Phone: [phone]   Fax:  [phone]  Name: EMONI WHITWORTH MRN: [id number] Date of Birth: 1965-08-29

## 2021-06-10 NOTE — Therapy (Deleted)
[place] ?[facility] ?[address] ?[place], [place], [address] ?Phone: [phone]   Fax:  [phone] ? ?Physical Therapy Treatment ? ?Patient Details  ?Name: Ariel Davis ?MRN: [id number] ?Date of Birth: 1965-08-23 ?Referring Provider (PT): Marlana Latus, MD ? ? ?Encounter Date: 06/08/2021 ? ? ? ?Past Medical History:  ?Diagnosis Date  ? Anxiety   ? Chronic back pain   ? H/O degenerative disc disease   ? Hypertension   ? Sciatica   ? ? ?No past surgical history on file. ? ?There were no vitals filed for this visit. ? ? Subjective Assessment - 06/10/21 1406   ? ? Subjective Patient reports she has 7/10 pain in her right knee upon arrival. States it has been aching like a toothache since last night. She states her L glute/hip/back pain continues to bother her and she rates it currently at 7/10. She states she felt okay after last PT session. Her HEP is going "pretty good."   ? Patient is accompained by: Family member   ? Pertinent History [name] who presents to outpatient physical therapy with a referral for medical diagnosis status post right total knee replacement. This patient's chief complaints consist of right knee pain and stiffness following R TKA on 04/09/2021 leading to the following functional deficits: difficulty with mobility including bed mobility, transfers, household and community ambulation, stairs; completing ADLs and IADLs such as cooking and cleaning..  Relevant past medical history and comorbidities include chronic back pain (does intermittently go down both legs), anxiety, sleep apnea, left hip fracture (2018 still bothers her, non surgical), bilateral foot pain, microcytic hypochromatic anemia, bilateral knee pain, obesity.  Patient denies hx of cancer, stroke, seizures, lung problem, major cardiac events, diabetes, unexplained weight loss, changes in bowel or bladder problems, new onset stumbling or  dropping things, spinal surgery, left hip surgery.   ? Limitations Sitting;House hold activities;Lifting;Standing;Walking;Other (comment)   mobility including bed mobility, transfers, household and community ambulation, stairs; completing ADLs and IADLs such as cooking and cleaning.  ? Patient Stated Goals "get up out of physical therapy and walk right"   ? Currently in Pain? Yes   ? Pain Score 7    W: 8/10; B: 6/10  ? Pain Location Hip   ? Pain Orientation Left   ? Pain Descriptors / Indicators Tingling;Aching   ? Pain Type Chronic pain   ? Pain Radiating Towards left anterior thigh and left groin   ? Pain Onset More than a month ago   ? Pain Frequency Constant   ? Aggravating Factors  walking, step standing on stairs with R leg forward and L back (hip extension, standing, cramping when she lifts her legs up when laying on her back.   ? Effect of Pain on Daily Activities lay down flat on her back, laying left knee on a pillow while on her back, sitting down, long axis distraction through the left hip   ? ?  ?  ? ?  ? ? ? ? PT Short Term Goals - 04/30/21 1732   ? ?  ? PT SHORT TERM GOAL #1  ? Title Be independent with initial home exercise program for self-management of symptoms.   ? Baseline Initial HEP provided at IE (04/28/2021);   ? Time 2   ? Period Weeks   ? Status Achieved   ? Target Date 05/12/21   ? ?  ?  ? ?  ? ? ? ?  PT Long Term Goals - 05/28/21 2022   ? ?  ? PT LONG TERM GOAL #1  ? Title Be independent with a long-term home exercise program for self-management of symptoms.   ? Baseline Initial HEP provided at IE (04/28/2021); patient reports good participation (05/28/2021);   ? Time 12   ? Period Weeks   ? Status Partially Met   TARGET DATE FOR ALL LONG TERM GOALS: 07/21/2021  ?  ? PT LONG TERM GOAL #2  ? Title Demonstrate improved FOTO to equal or greater than 60 by visit #14 to demonstrate improvement in overall condition and self-reported functional ability.   ? Baseline 42 (04/28/2021); 58 at visit #10  (05/28/2021);   ? Time 12   ? Period Weeks   ? Status Partially Met   ?  ? PT LONG TERM GOAL #3  ? Title Paitent will demonstrate R knee PROM equal or greater than 0-115 degrees to improve ability to step over objects, get up from a chair, and use the stairs.   ? Baseline 0-5-80 (04/28/2021); 0-7-115 (05/28/2021);   ? Time 12   ? Period Weeks   ? Status Partially Met   ?  ? PT LONG TERM GOAL #4  ? Title Patient will travel 1000 feet or more on 6 Minute Walk Test with LRAD to demonstrate improved community mobility.   ? Baseline to be tested visit 2 as appropriate (04/28/2021); 1079 with RW improved buttock pain (04/30/2021); 1069 with RW, Difficulty due to left glute/hip pain (05/28/2021);   ? Time 12   ? Period Weeks   ? Status On-going   ?  ? PT LONG TERM GOAL #5  ? Title Complete community, work and/or recreational activities without limitation due to current condition.   ? Baseline Functional Limitations: difficulty with mobility including bed mobility, transfers, household and community ambulation, stairs; completing ADLs and IADLs such as cooking and cleaning (04/28/2021); improved bed mobility, household and community ambulation, stairs but still has difficulty, is also limited by left glute/back/hip pain (05/28/2021);   ? Time 12   ? Period Weeks   ? Status Partially Met   ?  ? PT LONG TERM GOAL #6  ? Title Patient will complete 5 Time Sit to Stand Test in equal or less than 15 seconds from 18 inch plinth without use of B UE to demonstrate improved LE strength and power to improve household and community mobility and demonstrate decreased fall risk.   ? Baseline to be tested visit 2 as appropriate - currently needs B UE support to transfer sit <> stand (04/28/2021); 16 seconds from 18.5 inch plinth with B UE support and RW in front for safety (04/30/2021); 2 seconds from 18.5 inch plinth with B UE support. Difficulty due to left glute/hip pain. Unable to get up without using BUE due to knee weakness (05/28/2021);   ?  Time 12   ? Period Weeks   ? Status Partially Met   ? ?  ?  ? ?  ? ? ? ? ? ? ? ? ? ?Patient will benefit from skilled therapeutic intervention in order to improve the following deficits and impairments:    ? ?Visit Diagnosis: ?No diagnosis found. ? ? ? ? ?Problem List ?Patient Active Problem List  ? Diagnosis Date Noted  ? Class 2 obesity 06/13/2018  ? Arthritis of right knee 01/31/2018  ? DDD (degenerative disc disease), lumbar 01/31/2018  ? Pain medication agreement signed 01/31/2018  ? Chronic back pain 01/02/2018  ?  Pain in both feet 08/31/2017  ? Knee pain, bilateral 07/05/2017  ? Depression 06/07/2017  ? OSA (obstructive sleep apnea) 05/12/2017  ? Closed nondisplaced fracture of anterior wall of left acetabulum ([facility]) 08/27/2016  ? Localized osteoarthritis of right knee 08/27/2016  ? Closed fracture of posterior wall of left acetabulum ([facility]) 08/23/2016  ? HTN (hypertension) 06/21/2016  ? Allergic rhinitis 12/06/2013  ? Microcytic hypochromic anemia 12/06/2013  ? ? ?Nancy Nordmann, PT ?06/10/2021, 7:55 PM ? ?[place] ?[facility] ?[address] ?[place], [place], [address] ?Phone: [phone]   Fax:  [phone] ? ?Name: Ariel Davis ?MRN: [id number] ?Date of Birth: [birth year] ? ? ? ?

## 2021-06-15 ENCOUNTER — Emergency Department: Payer: Medicare Other

## 2021-06-15 ENCOUNTER — Other Ambulatory Visit: Payer: Self-pay

## 2021-06-15 ENCOUNTER — Ambulatory Visit: Payer: Medicare Other | Admitting: Physical Therapy

## 2021-06-15 ENCOUNTER — Inpatient Hospital Stay
Admission: EM | Admit: 2021-06-15 | Discharge: 2021-06-18 | DRG: 418 | Disposition: A | Discharge status: Home / Self Care | Payer: Medicare Other | Attending: Internal Medicine | Admitting: Internal Medicine

## 2021-06-15 ENCOUNTER — Encounter: Payer: Self-pay | Admitting: Emergency Medicine

## 2021-06-15 ENCOUNTER — Encounter: Payer: Self-pay | Admitting: Physical Therapy

## 2021-06-15 DIAGNOSIS — Z419 Encounter for procedure for purposes other than remedying health state, unspecified: Secondary | ICD-10-CM

## 2021-06-15 DIAGNOSIS — K8020 Calculus of gallbladder without cholecystitis without obstruction: Secondary | ICD-10-CM | POA: Diagnosis not present

## 2021-06-15 DIAGNOSIS — K828 Other specified diseases of gallbladder: Secondary | ICD-10-CM | POA: Diagnosis present

## 2021-06-15 DIAGNOSIS — Z791 Long term (current) use of non-steroidal anti-inflammatories (NSAID): Secondary | ICD-10-CM

## 2021-06-15 DIAGNOSIS — F32A Depression, unspecified: Secondary | ICD-10-CM | POA: Diagnosis present

## 2021-06-15 DIAGNOSIS — G8929 Other chronic pain: Secondary | ICD-10-CM | POA: Diagnosis present

## 2021-06-15 DIAGNOSIS — Z880 Allergy status to penicillin: Secondary | ICD-10-CM

## 2021-06-15 DIAGNOSIS — Z6841 Body Mass Index (BMI) 40.0 and over, adult: Secondary | ICD-10-CM | POA: Diagnosis not present

## 2021-06-15 DIAGNOSIS — M549 Dorsalgia, unspecified: Secondary | ICD-10-CM | POA: Diagnosis present

## 2021-06-15 DIAGNOSIS — K8042 Calculus of bile duct with acute cholecystitis without obstruction: Secondary | ICD-10-CM | POA: Diagnosis present

## 2021-06-15 DIAGNOSIS — Z20822 Contact with and (suspected) exposure to covid-19: Secondary | ICD-10-CM | POA: Diagnosis present

## 2021-06-15 DIAGNOSIS — K8050 Calculus of bile duct without cholangitis or cholecystitis without obstruction: Secondary | ICD-10-CM | POA: Diagnosis not present

## 2021-06-15 DIAGNOSIS — K660 Peritoneal adhesions (postprocedural) (postinfection): Secondary | ICD-10-CM | POA: Diagnosis present

## 2021-06-15 DIAGNOSIS — Z885 Allergy status to narcotic agent status: Secondary | ICD-10-CM

## 2021-06-15 DIAGNOSIS — Z79899 Other long term (current) drug therapy: Secondary | ICD-10-CM

## 2021-06-15 DIAGNOSIS — F419 Anxiety disorder, unspecified: Secondary | ICD-10-CM | POA: Diagnosis present

## 2021-06-15 DIAGNOSIS — I159 Secondary hypertension, unspecified: Secondary | ICD-10-CM | POA: Diagnosis not present

## 2021-06-15 DIAGNOSIS — K810 Acute cholecystitis: Secondary | ICD-10-CM

## 2021-06-15 DIAGNOSIS — K8010 Calculus of gallbladder with chronic cholecystitis without obstruction: Secondary | ICD-10-CM | POA: Diagnosis present

## 2021-06-15 DIAGNOSIS — R7989 Other specified abnormal findings of blood chemistry: Secondary | ICD-10-CM | POA: Diagnosis present

## 2021-06-15 DIAGNOSIS — R109 Unspecified abdominal pain: Secondary | ICD-10-CM | POA: Diagnosis present

## 2021-06-15 DIAGNOSIS — K760 Fatty (change of) liver, not elsewhere classified: Secondary | ICD-10-CM | POA: Diagnosis present

## 2021-06-15 DIAGNOSIS — R17 Unspecified jaundice: Secondary | ICD-10-CM | POA: Diagnosis not present

## 2021-06-15 DIAGNOSIS — R7401 Elevation of levels of liver transaminase levels: Secondary | ICD-10-CM

## 2021-06-15 DIAGNOSIS — I10 Essential (primary) hypertension: Secondary | ICD-10-CM

## 2021-06-15 DIAGNOSIS — Z881 Allergy status to other antibiotic agents status: Secondary | ICD-10-CM | POA: Diagnosis not present

## 2021-06-15 DIAGNOSIS — E669 Obesity, unspecified: Secondary | ICD-10-CM | POA: Diagnosis not present

## 2021-06-15 DIAGNOSIS — E66813 Obesity, class 3: Secondary | ICD-10-CM

## 2021-06-15 DIAGNOSIS — R1011 Right upper quadrant pain: Secondary | ICD-10-CM

## 2021-06-15 LAB — HEPATITIS PANEL, ACUTE
HCV Ab: NONREACTIVE
Hep A IgM: NONREACTIVE
Hep B C IgM: NONREACTIVE
Hepatitis B Surface Ag: NONREACTIVE

## 2021-06-15 LAB — COMPREHENSIVE METABOLIC PANEL
ALT: 1486 U/L — ABNORMAL HIGH (ref 0–44)
AST: 898 U/L — ABNORMAL HIGH (ref 15–41)
Albumin: 4 g/dL (ref 3.5–5.0)
Alkaline Phosphatase: 175 U/L — ABNORMAL HIGH (ref 38–126)
Anion gap: 5 (ref 5–15)
BUN: 19 mg/dL (ref 6–20)
CO2: 25 mmol/L (ref 22–32)
Calcium: 9.4 mg/dL (ref 8.9–10.3)
Chloride: 108 mmol/L (ref 98–111)
Creatinine, Ser: 0.81 mg/dL (ref 0.44–1.00)
GFR, Estimated: >60 mL/min (ref >60)
Glucose, Bld: 82 mg/dL (ref 70–99)
Potassium: 4.1 mmol/L (ref 3.5–5.1)
Sodium: 138 mmol/L (ref 135–145)
Total Bilirubin: 4 mg/dL — ABNORMAL HIGH (ref 0.3–1.2)
Total Protein: 7.1 g/dL (ref 6.5–8.1)

## 2021-06-15 LAB — CBC
HCT: 45.6 % (ref 36.0–46.0)
Hemoglobin: 13.1 g/dL (ref 12.0–15.0)
MCH: 25.3 pg — ABNORMAL LOW (ref 26.0–34.0)
MCHC: 28.7 g/dL — ABNORMAL LOW (ref 30.0–36.0)
MCV: 88.2 fL (ref 80.0–100.0)
Platelets: 220 10*3/uL (ref 150–400)
RBC: 5.17 MIL/uL — ABNORMAL HIGH (ref 3.87–5.11)
RDW: 15.5 % (ref 11.5–15.5)
WBC: 2.5 10*3/uL — ABNORMAL LOW (ref 4.0–10.5)
nRBC: 0 % (ref 0.0–0.2)

## 2021-06-15 LAB — RESP PANEL BY RT-PCR (FLU A&B, COVID) ARPGX2
Influenza A by PCR: NEGATIVE
Influenza B by PCR: NEGATIVE
SARS Coronavirus 2 by RT PCR: NEGATIVE

## 2021-06-15 LAB — CBC WITH DIFFERENTIAL/PLATELET
Abs Immature Granulocytes: 0.01 10*3/uL (ref 0.00–0.07)
Basophils Absolute: 0 10*3/uL (ref 0.0–0.1)
Basophils Relative: 2 %
Eosinophils Absolute: 0.2 10*3/uL (ref 0.0–0.5)
Eosinophils Relative: 8 %
HCT: 41.3 % (ref 36.0–46.0)
Hemoglobin: 12.1 g/dL (ref 12.0–15.0)
Immature Granulocytes: 0 %
Lymphocytes Relative: 41 %
Lymphs Abs: 1 10*3/uL (ref 0.7–4.0)
MCH: 25.4 pg — ABNORMAL LOW (ref 26.0–34.0)
MCHC: 29.3 g/dL — ABNORMAL LOW (ref 30.0–36.0)
MCV: 86.8 fL (ref 80.0–100.0)
Monocytes Absolute: 0.3 10*3/uL (ref 0.1–1.0)
Monocytes Relative: 12 %
Neutro Abs: 0.9 10*3/uL — ABNORMAL LOW (ref 1.7–7.7)
Neutrophils Relative %: 37 %
Platelets: 221 10*3/uL (ref 150–400)
RBC: 4.76 MIL/uL (ref 3.87–5.11)
RDW: 15.5 % (ref 11.5–15.5)
Smear Review: NORMAL
WBC: 2.4 10*3/uL — ABNORMAL LOW (ref 4.0–10.5)
nRBC: 0 % (ref 0.0–0.2)

## 2021-06-15 LAB — PREGNANCY, URINE: Preg Test, Ur: NEGATIVE

## 2021-06-15 LAB — URINALYSIS, ROUTINE W REFLEX MICROSCOPIC
Bilirubin Urine: NEGATIVE
Glucose, UA: NEGATIVE mg/dL
Hgb urine dipstick: NEGATIVE
Ketones, ur: NEGATIVE mg/dL
Leukocytes,Ua: NEGATIVE
Nitrite: NEGATIVE
Protein, ur: NEGATIVE mg/dL
Specific Gravity, Urine: 1.014 (ref 1.005–1.030)
pH: 8 (ref 5.0–8.0)

## 2021-06-15 LAB — LIPASE, BLOOD: Lipase: 40 U/L (ref 11–51)

## 2021-06-15 LAB — ACETAMINOPHEN LEVEL: Acetaminophen (Tylenol), Serum: <10 ug/mL — ABNORMAL LOW (ref 10–30)

## 2021-06-15 MED ORDER — PROCHLORPERAZINE EDISYLATE 10 MG/2ML IJ SOLN: 10.0000 mg | INTRAMUSCULAR | Status: DC | PRN

## 2021-06-15 MED ORDER — ONDANSETRON 4 MG PO TBDP
4.0000 mg | ORAL_TABLET | Freq: Once | ORAL | Status: AC | PRN
  Administered 2021-06-15: 4 mg via ORAL
  Filled 2021-06-15: qty 1

## 2021-06-15 MED ORDER — BACLOFEN 10 MG PO TABS
10.0000 mg | ORAL_TABLET | Freq: Two times a day (BID) | ORAL | Status: DC
  Administered 2021-06-16 – 2021-06-18 (×5): 10 mg via ORAL
  Filled 2021-06-15 (×6): qty 1

## 2021-06-15 MED ORDER — PREGABALIN 50 MG PO CAPS
50.0000 mg | ORAL_CAPSULE | Freq: Three times a day (TID) | ORAL | Status: DC
  Administered 2021-06-16 – 2021-06-18 (×6): 50 mg via ORAL
  Filled 2021-06-15 (×7): qty 1

## 2021-06-15 MED ORDER — METRONIDAZOLE 500 MG/100ML IV SOLN
500.0000 mg | Freq: Once | INTRAVENOUS | Status: AC
  Administered 2021-06-15: 500 mg via INTRAVENOUS
  Filled 2021-06-15: qty 100

## 2021-06-15 MED ORDER — FENTANYL CITRATE PF 50 MCG/ML IJ SOSY: 25.0000 ug | PREFILLED_SYRINGE | INTRAMUSCULAR | Status: DC | PRN

## 2021-06-15 MED ORDER — CEFTRIAXONE SODIUM 1 G IJ SOLR
1.0000 g | Freq: Once | INTRAMUSCULAR | Status: AC
  Administered 2021-06-15: 1 g via INTRAVENOUS
  Filled 2021-06-15: qty 10

## 2021-06-15 MED ORDER — PANTOPRAZOLE SODIUM 40 MG PO TBEC
40.0000 mg | DELAYED_RELEASE_TABLET | Freq: Every day | ORAL | Status: DC
  Administered 2021-06-16 (×2): 40 mg via ORAL
  Filled 2021-06-15 (×3): qty 1

## 2021-06-15 MED ORDER — TOPIRAMATE 25 MG PO TABS
50.0000 mg | ORAL_TABLET | Freq: Every day | ORAL | Status: DC
  Administered 2021-06-16 – 2021-06-17 (×3): 50 mg via ORAL
  Filled 2021-06-15 (×2): qty 2

## 2021-06-15 MED ORDER — MORPHINE SULFATE (PF) 4 MG/ML IV SOLN
4.0000 mg | Freq: Once | INTRAVENOUS | Status: AC
  Administered 2021-06-15: 4 mg via INTRAVENOUS
  Filled 2021-06-15: qty 1

## 2021-06-15 MED ORDER — VENLAFAXINE HCL ER 37.5 MG PO CP24
37.5000 mg | ORAL_CAPSULE | Freq: Every day | ORAL | Status: DC
  Administered 2021-06-16 – 2021-06-18 (×4): 37.5 mg via ORAL
  Filled 2021-06-15 (×4): qty 1

## 2021-06-15 MED ORDER — HYDROXYZINE HCL 25 MG PO TABS: 25.0000 mg | ORAL_TABLET | Freq: Three times a day (TID) | ORAL | Status: DC | PRN

## 2021-06-15 MED ORDER — SODIUM CHLORIDE 0.9 % IV SOLN
2.0000 g | INTRAVENOUS | Status: DC
  Administered 2021-06-16 – 2021-06-17 (×2): 2 g via INTRAVENOUS
  Filled 2021-06-15 (×2): qty 2

## 2021-06-15 MED ORDER — ROPINIROLE HCL 1 MG PO TABS
0.5000 mg | ORAL_TABLET | Freq: Every day | ORAL | Status: DC
  Administered 2021-06-16 – 2021-06-17 (×3): 0.5 mg via ORAL
  Filled 2021-06-15 (×2): qty 1
  Filled 2021-06-15 (×2): qty 2

## 2021-06-15 MED ORDER — METRONIDAZOLE 500 MG/100ML IV SOLN
500.0000 mg | Freq: Two times a day (BID) | INTRAVENOUS | Status: DC
  Administered 2021-06-16 – 2021-06-18 (×5): 500 mg via INTRAVENOUS
  Filled 2021-06-15 (×5): qty 100

## 2021-06-15 MED ORDER — ONDANSETRON HCL 4 MG/2ML IJ SOLN
4.0000 mg | Freq: Once | INTRAMUSCULAR | Status: AC
  Administered 2021-06-15: 4 mg via INTRAVENOUS
  Filled 2021-06-15: qty 2

## 2021-06-15 MED ORDER — SODIUM CHLORIDE 0.9 % IV SOLN: INTRAVENOUS | Status: AC

## 2021-06-15 MED ORDER — DICLOFENAC SODIUM 1 % TD GEL
4.0000 g | Freq: Four times a day (QID) | TRANSDERMAL | Status: DC
  Administered 2021-06-16 – 2021-06-18 (×8): 4 g via TOPICAL
  Filled 2021-06-15: qty 100

## 2021-06-15 MED ORDER — HYDROMORPHONE HCL 1 MG/ML IJ SOLN
0.5000 mg | Freq: Once | INTRAMUSCULAR | Status: AC
  Administered 2021-06-15: 0.5 mg via INTRAVENOUS
  Filled 2021-06-15: qty 1

## 2021-06-15 MED ORDER — GADOBUTROL 1 MMOL/ML IV SOLN
10.0000 mL | Freq: Once | INTRAVENOUS | Status: AC | PRN
  Administered 2021-06-15: 10 mL via INTRAVENOUS
  Filled 2021-06-15: qty 10

## 2021-06-15 MED ORDER — AMLODIPINE BESYLATE 5 MG PO TABS
5.0000 mg | ORAL_TABLET | Freq: Every day | ORAL | Status: DC
  Administered 2021-06-16 – 2021-06-18 (×3): 5 mg via ORAL
  Filled 2021-06-15 (×3): qty 1

## 2021-06-15 MED ORDER — SODIUM CHLORIDE 0.9 % IV BOLUS
1000.0000 mL | Freq: Once | INTRAVENOUS | Status: AC
  Administered 2021-06-15: 1000 mL via INTRAVENOUS

## 2021-06-15 MED ORDER — BUPRENORPHINE HCL 300 MCG BU FILM: 1.0000 | ORAL_FILM | Freq: Two times a day (BID) | BUCCAL | Status: DC

## 2021-06-15 NOTE — Consult Note (Signed)
SURGICAL CONSULTATION NOTE   HISTORY OF PRESENT ILLNESS (HPI):  56 y.o. female presented to [facility] ED for evaluation of abdominal pain. Patient reports started having abdominal pain since 2 days ago.  She went to the physical therapy today and she was noted to have worsening abdominal pain.  Pain localized to the epigastric area.  There is no pain radiation.  There has been minimal improvement with pain medication at the ED.  Patient cannot identify any aggravating factors.  At the ED she was found with abdominal tenderness on the epigastric area.  Vital signs within normal limits without fever.  White blood cell count of 2.5.  There is a bilirubin of 4.0.  AST 889, ALT 1486 and alkaline phosphatase 175.  She had abdominal ultrasound that shows cholelithiasis with dilated common bile duct.  MRCP shows multiple gallstones without sign of choledocholithiasis.  I personally evaluated the images of the ultrasound and MRCP.  Surgery is consulted by Dr. Sidney Ace in this context for evaluation and management of cholelithiasis with generalized.  PAST MEDICAL HISTORY (PMH):  Past Medical History:  Diagnosis Date   Anxiety    Chronic back pain    H/O degenerative disc disease    Hypertension    Sciatica      PAST SURGICAL HISTORY (PSH):  History reviewed. No pertinent surgical history.   MEDICATIONS:  Prior to Admission medications   Medication Sig Start Date End Date Taking? Authorizing Provider  amLODipine (NORVASC) 10 MG tablet Take 10 mg by mouth daily. 06/06/21   [provider]  amLODipine (NORVASC) 5 MG tablet Take 5 mg by mouth daily. 07/12/16   [provider]  baclofen (LIORESAL) 10 MG tablet Take 10 mg by mouth 2 (two) times daily. 12/14/20   [provider]  Buprenorphine 15 MCG/HR PTWK APPLY 1 PATCH TOPICALLY TO SKIN ONCE A WEEK 08/21/18   [provider]  Buprenorphine HCl 300 MCG FILM APPLY 75 MCG TO CHEEK TWICE DAILY FOR CHRONIC PAIN. REPLACING PATCH  12/25/18   [provider]  celecoxib (CELEBREX) 200 MG capsule Take 200 mg by mouth daily as needed. 06/13/21   [provider]  diclofenac sodium (VOLTAREN) 1 % GEL APPLY 2 TO 4 GRAMS TOPICALLY 4 TIMES DAILY 08/02/18   [provider]  fluticasone (FLONASE) 50 MCG/ACT nasal spray Place 2 sprays into both nostrils daily. 01/02/21   Eusebio Friendly B, PA-C  gabapentin (NEURONTIN) 300 MG capsule Take 800 mg 3 (three) times daily by mouth.    [provider]  hydrOXYzine (ATARAX/VISTARIL) 25 MG tablet Can take 1-2 tablets 3 times a day as needed for anxiety 01/02/18   [provider]  lisinopril-hydrochlorothiazide (PRINZIDE,ZESTORETIC) 20-12.5 MG tablet Take 1 tablet by mouth daily. 07/12/16   [provider]  meloxicam (MOBIC) 15 MG tablet Take 15 mg by mouth daily. 07/20/18   [provider]  methylPREDNISolone (MEDROL DOSEPAK) 4 MG TBPK tablet 6 day dose pack - take as directed 01/16/19   Felecia Shelling, DPM  omeprazole (PRILOSEC) 20 MG capsule Take by mouth. 03/19/18 01/02/21  [provider]  ondansetron (ZOFRAN ODT) 4 MG disintegrating tablet Take 1 tablet (4 mg total) by mouth every 8 (eight) hours as needed. 10/16/20   Nita Sickle, MD  pregabalin (LYRICA) 50 MG capsule Take 50 mg by mouth 3 (three) times daily. 12/22/20   [provider]  promethazine-dextromethorphan (PROMETHAZINE-DM) 6.25-15 MG/5ML syrup Take 5 mLs by mouth 4 (four) times daily as needed for  cough. 01/02/21   Danton Clap, PA-C  rOPINIRole (REQUIP) 0.5 MG tablet Take 1 tablet (0.5 mg total) by mouth at bedtime. 05/17/17   Fisher, Linden Dolin, PA-C  topiramate (TOPAMAX) 50 MG tablet Take 50 mg by mouth at bedtime. 08/01/18   [provider]  venlafaxine XR (EFFEXOR XR) 37.5 MG 24 hr capsule Take 1 capsule (37.5 mg total) by mouth daily. 10/16/20 10/16/21  Rudene Re, MD  VICTOZA 18 MG/3ML SOPN 0.2 mLs. 06/06/21   [provider]   Vitamin D, Ergocalciferol, (DRISDOL) 1.25 MG (50000 UT) CAPS capsule Take 50,000 Units by mouth once a week. 08/23/18   [provider]     ALLERGIES:  Allergies  Allergen Reactions   Amoxicillin Hives    Mild hives only on arms. No severe reactions.   Oxycodone Itching   Duloxetine Rash   Hydrocodone Itching     SOCIAL HISTORY:  Social History   Socioeconomic History   Marital status: Single    Spouse name: Not on file   Number of children: Not on file   Years of education: Not on file   Highest education level: Not on file  Occupational History   Not on file  Tobacco Use   Smoking status: Never   Smokeless tobacco: Never  Vaping Use   Vaping Use: Never used  Substance and Sexual Activity   Alcohol use: No   Drug use: No   Sexual activity: Not on file  Other Topics Concern   Not on file  Social History Narrative   Not on file   Social Determinants of Health   Financial Resource Strain: Not on file  Food Insecurity: Not on file  Transportation Needs: Not on file  Physical Activity: Not on file  Stress: Not on file  Social Connections: Not on file  Intimate Partner Violence: Not on file      FAMILY HISTORY:  History reviewed. No pertinent family history.   REVIEW OF SYSTEMS:  Constitutional: denies weight loss, fever, chills, or sweats  Eyes: denies any other vision changes, history of eye injury  ENT: denies sore throat, hearing problems  Respiratory: denies shortness of breath, wheezing  Cardiovascular: denies chest pain, palpitations  Gastrointestinal: Positive abdominal pain, nausea and vomiting Genitourinary: denies burning with urination or urinary frequency Musculoskeletal: denies any other joint pains or cramps  Skin: denies any other rashes or skin discolorations  Neurological: denies any other headache, dizziness, weakness  Psychiatric: denies any other depression, anxiety   All other review of systems were negative   VITAL SIGNS:   Temp:  [98.1 F (36.7 C)-98.2 F (36.8 C)] 98.2 F (36.8 C) (03/13 2035) Pulse Rate:  [62-78] 62 (03/13 2035) Resp:  [18-20] 20 (03/13 1723) BP: (107-134)/(66-76) 134/74 (03/13 2035) SpO2:  [100 %] 100 % (03/13 2035) Weight:  [108.9 kg] 108.9 kg (03/13 1432)     Height: 5\' 6"  (167.6 cm) Weight: 108.9 kg BMI (Calculated): 38.76   INTAKE/OUTPUT:  This shift: Total I/O In: 1000 [IV Piggyback:1000] Out: -   Last 2 shifts: @IOLAST2SHIFTS @   PHYSICAL EXAM:  Constitutional:  -- Normal body habitus  -- Awake, alert, and oriented x3  Eyes:  -- Pupils equally round and reactive to light  -- No scleral icterus  Ear, nose, and throat:  -- No jugular venous distension  Pulmonary:  -- No crackles  -- Equal breath sounds bilaterally -- Breathing non-labored at rest Cardiovascular:  -- S1, S2 present  -- No pericardial rubs  Gastrointestinal:  -- Abdomen soft, mild tender to palpation in the epigastric area, non-distended, no guarding or rebound tenderness -- No abdominal masses appreciated, pulsatile or otherwise  Musculoskeletal and Integumentary:  -- Wounds: None appreciated -- Extremities: B/L UE and LE FROM, hands and feet warm, no edema  Neurologic:  -- Motor function: intact and symmetric -- Sensation: intact and symmetric   Labs:  CBC Latest Ref Rng & Units 06/15/2021 06/15/2021 10/15/2020  WBC 4.0 - 10.5 K/uL 2.4(L) 2.5(L) 4.6  Hemoglobin 12.0 - 15.0 g/dL 12.1 13.1 14.1  Hematocrit 36.0 - 46.0 % 41.3 45.6 45.7  Platelets 150 - 400 K/uL 221 220 215   CMP Latest Ref Rng & Units 06/15/2021 10/15/2020 04/28/2019  Glucose 70 - 99 mg/dL 82 93 90  BUN 6 - 20 mg/dL 19 13 16   Creatinine 0.44 - 1.00 mg/dL 0.81 0.82 0.90  Sodium 135 - 145 mmol/L 138 143 142  Potassium 3.5 - 5.1 mmol/L 4.1 3.4(L) 3.6  Chloride 98 - 111 mmol/L 108 113(H) 107  CO2 22 - 32 mmol/L 25 23 27   Calcium 8.9 - 10.3 mg/dL 9.4 9.2 9.2  Total Protein 6.5 - 8.1 g/dL 7.1 6.2(L) 8.0  Total Bilirubin 0.3 - 1.2  mg/dL 4.0(H) 1.0 0.8  Alkaline Phos 38 - 126 U/L 175(H) 31(L) 52  AST 15 - 41 U/L 898(H) 31 28  ALT 0 - 44 U/L 1,486(H) 29 37    Imaging studies:  EXAM: MRI ABDOMEN WITH CONTRAST (WITH MRCP)   TECHNIQUE: Multiplanar multisequence MR imaging of the abdomen was performed following the administration of intravenous contrast. Heavily T2-weighted images of the biliary and pancreatic ducts were obtained, and three-dimensional MRCP images were rendered by post processing.   CONTRAST:  47mL GADAVIST GADOBUTROL 1 MMOL/ML IV SOLN   COMPARISON:  Right upper quadrant ultrasound, 06/15/2021   FINDINGS: Lower chest: No acute findings.   Hepatobiliary: Mild hepatic steatosis. No mass or other parenchymal abnormality identified. Tiny gallstones and sludge in the dependent gallbladder (series 4, image 23). Small volume pericholecystic fluid. Minimal intra and extrahepatic biliary ductal dilatation, common bile duct measuring up to 0.8 cm in caliber.   Pancreas: No mass, inflammatory changes, or other parenchymal abnormality identified.No pancreatic ductal dilatation.   Spleen:  Within normal limits in size and appearance.   Adrenals/Urinary Tract: Normal adrenal glands. No renal masses or suspicious contrast enhancement identified. No evidence of hydronephrosis.   Stomach/Bowel: Visualized portions within the abdomen are unremarkable.   Vascular/Lymphatic: No pathologically enlarged lymph nodes identified. No abdominal aortic aneurysm demonstrated.   Other:  None.   Musculoskeletal: No suspicious osseous lesions identified.   IMPRESSION: 1. Tiny gallstones and sludge in the dependent gallbladder. Small volume pericholecystic fluid. 2. Minimal intra and extrahepatic biliary ductal dilatation, common bile duct measuring up to 0.8 cm in caliber. No choledocholithiasis or other obstructing lesion identified to the ampulla. 3. Findings are equivocal for acute cholecystitis, this  appearance potentially caused by recently passed gallstone. 4. Mild hepatic steatosis.     Electronically Signed   By: Delanna Ahmadi M.D.   On: 06/15/2021 19:36  EXAM: ULTRASOUND ABDOMEN LIMITED RIGHT UPPER QUADRANT   COMPARISON:  None.   FINDINGS: Gallbladder:   Sludge and small stones are seen in the lumen of the gallbladder. Technologist did not observe any tenderness over the gallbladder. There is no wall thickening in gallbladder. There is no fluid around the gallbladder.   Common bile duct:   Diameter: 7.7 mm. Distal common bile  duct is not adequately visualized for evaluation.   Liver:   There is increased echogenicity in the liver suggesting fatty infiltration. No focal abnormality is seen in the visualized portions of liver. Portal vein is patent on color Doppler imaging with normal direction of blood flow towards the liver.   Other: None.   IMPRESSION: Sludge and stones are seen in the gallbladder. There are no sonographic signs of acute cholecystitis.   Fatty liver.   No other sonographic abnormality is seen in the right upper quadrant.     Electronically Signed   By: Elmer Picker M.D.   On: 06/15/2021 17:13  Assessment/Plan:  56 y.o. female with cholelithiasis and jaundice, complicated by pertinent comorbidities including hypertension, anxiety.  Patient with history, physical exam and imaging consistent with cholelithiasis with biliary colic and hyperbilirubinemia.  MRCP was reported as negative.  This could be due to passed sludge/stone and if that is the case the bilirubin should start trending down and the pain should improve.  If the bilirubin is higher I would recommend evaluation by gastroenterology to see if she will benefit of ERCP as a diagnostic/therapeutic evaluation for worsening hyperbilirubinemia.  If bilirubin comes down because the stone already passed I would recommend cholecystectomy during admission.  If patient still need  ERCP I will still recommend cholecystectomy before discharge.  Patient high risk for recurrence of choledocholithiasis.  Appreciate hospitalist admission for further work-up of jaundice.   I will continue to follow closely  Arnold Long, MD

## 2021-06-15 NOTE — Therapy (Signed)
[place] ?[facility] ?[address] ?[place], [place], [address] ?Phone: [phone]   Fax:  [phone] ? ?Patient Details  ?Name: Ariel Davis ?MRN: [id number] ?Date of Birth: February 25, 1966 ?Referring Provider:  No ref. provider found ? ?Encounter Date: 06/15/2021 ? ?Patient arrived for her PT appointment at 1pm but reported inability to keep food down and repeated vomiting since yesterday and including a few min before arriving to PT. She reported pain in her upper abdomen and inability to have a BM. She was advised of our policy of not being seen within 24 hours of vomiting and recommended to contact her doctor if symptoms persisted. Patient was disappointed that she was unable to be seen for PT today but voiced understanding. She states her L hip/low back has been pretty bad lately and she thought she might need to go to the ER for it this weekend. States she felt okay after last PT session but laying prone each day had no effect. ? ? ?Luretha Murphy. Ilsa Iha, PT, DPT ?06/15/21, 1:22 PM ? ? ?[place] ?[facility] ?[address] ?[place], [place], [address] ?Phone: [phone]   Fax:  [phone] ?

## 2021-06-15 NOTE — ED Provider Notes (Signed)
? ?[facility] ?Provider Note ? ? ? Event Date/Time  ? First MD Initiated Contact with Patient 06/15/21 1541   ?  (approximate) ? ? ?History  ? ?Abdominal Pain ? ? ?HPI ? ?Ariel Davis is a 56 y.o. female  with pmh HTN, anxiety, chronic back pain presents with abdominal pain.  Symptoms have been going on for about 2 days.  Pain is located in epigastric region and is constant.  No history of similar.  Has had multiple episodes of nonbloody nonbilious emesis.  Denies fevers or chills.  No diarrhea or constipation.  Has not really been able to eat since this started.  Takes about 4 g of Tylenol daily but nothing more than this.  Denies alcohol intake.  No new herbal supplements or over-the-counter supplements. ?  ? ?Past Medical History:  ?Diagnosis Date  ? Anxiety   ? Chronic back pain   ? H/O degenerative disc disease   ? Hypertension   ? Sciatica   ? ? ?Patient Active Problem List  ? Diagnosis Date Noted  ? Class 2 obesity 06/13/2018  ? Arthritis of right knee 01/31/2018  ? DDD (degenerative disc disease), lumbar 01/31/2018  ? Pain medication agreement signed 01/31/2018  ? Chronic back pain 01/02/2018  ? Pain in both feet 08/31/2017  ? Knee pain, bilateral 07/05/2017  ? Depression 06/07/2017  ? OSA (obstructive sleep apnea) 05/12/2017  ? Closed nondisplaced fracture of anterior wall of left acetabulum (HCC) 08/27/2016  ? Localized osteoarthritis of right knee 08/27/2016  ? Closed fracture of posterior wall of left acetabulum (HCC) 08/23/2016  ? HTN (hypertension) 06/21/2016  ? Allergic rhinitis 12/06/2013  ? Microcytic hypochromic anemia 12/06/2013  ? ? ? ?Physical Exam  ?Triage Vital Signs: ?ED Triage Vitals  ?Enc Vitals Group  ?   BP 06/15/21 1431 107/66  ?   Pulse Rate 06/15/21 1431 78  ?   Resp 06/15/21 1431 18  ?   Temp 06/15/21 1431 98.1 ?F (36.7 ?C)  ?   Temp Source 06/15/21 1431 Oral  ?   SpO2 06/15/21 1431 100 %  ?   Weight 06/15/21 1432 240 lb (108.9 kg)  ?   Height 06/15/21 1432  5' 6" (1.676 m)  ?   Head Circumference --   ?   Peak Flow --   ?   Pain Score 06/15/21 1432 6  ?   Pain Loc --   ?   Pain Edu? --   ?   Excl. in GC? --   ? ? ?Most recent vital signs: ?Vitals:  ? 06/15/21 1431 06/15/21 1723  ?BP: 107/66 117/76  ?Pulse: 78 73  ?Resp: 18 20  ?Temp: 98.1 ?F (36.7 ?C)   ?SpO2: 100% 100%  ? ? ? ?General: Awake, appears uncomfortable ?CV:  Good peripheral perfusion.  ?Resp:  Normal effort.  ?Abd:  No distention.  Tender to palpation in the epigastric region ?Neuro:             Awake, Alert, Oriented x 3  ?Other:   ? ? ?ED Results / Procedures / Treatments  ?Labs ?(all labs ordered are listed, but only abnormal results are displayed) ?Labs Reviewed  ?COMPREHENSIVE METABOLIC PANEL - Abnormal; Notable for the following components:  ?    Result Value  ? AST 898 (*)   ? ALT 1,486 (*)   ? Alkaline Phosphatase 175 (*)   ? Total Bilirubin 4.0 (*)   ? All other components within normal limits  ?  CBC - Abnormal; Notable for the following components:  ? WBC 2.5 (*)   ? RBC 5.17 (*)   ? MCH 25.3 (*)   ? MCHC 28.7 (*)   ? All other components within normal limits  ?URINALYSIS, ROUTINE W REFLEX MICROSCOPIC - Abnormal; Notable for the following components:  ? Color, Urine AMBER (*)   ? APPearance CLOUDY (*)   ? All other components within normal limits  ?ACETAMINOPHEN LEVEL - Abnormal; Notable for the following components:  ? Acetaminophen (Tylenol), Serum <10 (*)   ? All other components within normal limits  ?CBC WITH DIFFERENTIAL/PLATELET - Abnormal; Notable for the following components:  ? WBC 2.4 (*)   ? MCH 25.4 (*)   ? MCHC 29.3 (*)   ? Neutro Abs 0.9 (*)   ? All other components within normal limits  ?CULTURE, BLOOD (ROUTINE X 2)  ?CULTURE, BLOOD (ROUTINE X 2)  ?LIPASE, BLOOD  ?PREGNANCY, URINE  ?HEPATITIS PANEL, ACUTE  ?PATHOLOGIST SMEAR REVIEW  ? ? ? ?EKG ? ? ? ? ?RADIOLOGY ?I reviewed the ultrasound of the gallbladder which shows sludge and stones no cholecystitis ? ? ?PROCEDURES: ? ?Critical  Care performed: No ? ?Procedures ? ?The patient is on the cardiac monitor to evaluate for evidence of arrhythmia and/or significant heart rate changes. ? ? ?MEDICATIONS ORDERED IN ED: ?Medications  ?cefTRIAXone (ROCEPHIN) 1 g in sodium chloride 0.9 % 100 mL IVPB (has no administration in time range)  ?metroNIDAZOLE (FLAGYL) IVPB 500 mg (has no administration in time range)  ?HYDROmorphone (DILAUDID) injection 0.5 mg (has no administration in time range)  ?ondansetron (ZOFRAN-ODT) disintegrating tablet 4 mg (4 mg Oral Given 06/15/21 1434)  ?morphine (PF) 4 MG/ML injection 4 mg (4 mg Intravenous Given 06/15/21 1720)  ?ondansetron (ZOFRAN) injection 4 mg (4 mg Intravenous Given 06/15/21 1722)  ?sodium chloride 0.9 % bolus 1,000 mL (1,000 mLs Intravenous New Bag/Given 06/15/21 1712)  ?gadobutrol (GADAVIST) 1 MMOL/ML injection 10 mL (10 mLs Intravenous Contrast Given 06/15/21 1923)  ? ? ? ?IMPRESSION / MDM / ASSESSMENT AND PLAN / ED COURSE  ?I reviewed the triage vital signs and the nursing notes. ?             ?               ? ?Differential diagnosis includes, but is not limited to, choledocholithiasis, cholecystitis, pancreatitis, biliary colic, gastritis ? ?Patient is a 56-year-old female presenting with epigastric abdominal pain and vomiting.  Patient's labs are notable for significant LFT derangements, elevated alk phos to 175 with a T. bili of 4, ALT is greater than 1400 AST almost 900.  Her lipase is negative she is not having pancreatitis.  White count is actually low at 2.5.  Her vital signs are within normal limits.  She does appear uncomfortable and is tender in the epigastric region.  I am concerned for cholangitis versus choledocholithiasis.  We will start with a right upper quadrant ultrasound but will likely need an MRCP.  We will treat pain with morphine and give a bolus of fluids as well as Zofran for her nausea. ? ?Since right upper quadrant ultrasound shows a CBD at 7.7 mm, she has stones and sludge but no  cholecystitis.  We will obtain an MRCP. ? ?MRCP is equivocal for cholecystitis or some pericholecystic fluid.  There is minimal intra and extra hepatic ductal dilatation without obvious choledocholithiasis.  Patient has some ongoing pain on exam but is improved.  Discussed with Dr. Cintron   with general surgery who recommends admission and likely cholecystectomy during index admission.  Recommends discussing with GI as well does agree with antibiotics.  Patient has had a rash to amoxicillin in the past so we will use ceftriaxone and Flagyl.  Discussed with Dr. Haig Prophet with GI agrees with admission.  Dr. Verl Blalock is available if need be during the week. ? ?  ? ? ?FINAL CLINICAL IMPRESSION(S) / ED DIAGNOSES  ? ?Final diagnoses:  ?RUQ pain  ?Gallstones  ?Transaminitis  ? ? ? ?Rx / DC Orders  ? ?ED Discharge Orders   ? ? None  ? ?  ? ? ? ?Note:  This document was prepared using Dragon voice recognition software and may include unintentional dictation errors. ?  ?Rada Hay, MD ?06/15/21 2006 ? ?

## 2021-06-15 NOTE — ED Triage Notes (Signed)
Pt here with abd pain x2 days. Pt states pain is upper and her last BM was this past Sat. Pt denies diarrhea but endorses N/V. Pt took gas-x and pepto to try to relieve that pain but it has not helped. Pt stable in triage. ?

## 2021-06-15 NOTE — H&P (Signed)
History and Physical    Ariel Davis SJG:[id number] DOB: [birth year] DOA: 06/15/2021  PCP: Willaim Rayas, NP   Patient coming from: Home   Chief Complaint: Abdominal pain, N/V   HPI: Ariel Davis is a pleasant 56 y.o. female with medical history significant for hypertension, chronic pain, and anxiety, presenting to the emergency department with abdominal pain, nausea, and vomiting.  The patient reports that she had been in her usual state until yesterday morning when she developed cute onset of pain across the upper abdomen with nausea and recurrent episodes of nonbloody vomiting.  Pain has been constant, localized to the upper abdomen, and not alleviated with Gas-X or Pepto-Bismol.  She reports having a similar experience years ago she believes resolved on her own and she remembers that it may have been attributed to gallstones.  ED Course: Upon arrival to the ED, patient is found to be afebrile and saturating well on room air with stable blood pressure.  Chemistry panel notable for alkaline phosphatase 175, AST 898, ALT 1486, and bilirubin 4.0.  CBC features a WBC of 2400.  Right upper quadrant ultrasound reveals sludge and stones in the gallbladder.  MRCP notable for tiny stones and gallbladder sludge with small volume pericholecystic fluid, minimal biliary dilatation, no choledocholithiasis or other obstructing lesion.  Patient was given IV fluids, Rocephin, and Flagyl in the ED, and ED physician consulted with GI and general surgery.  Review of Systems:  All other systems reviewed and apart from HPI, are negative.  Past Medical History:  Diagnosis Date   Anxiety    Chronic back pain    H/O degenerative disc disease    Hypertension    Sciatica     History reviewed. No pertinent surgical history.  Social History:   reports that she has never smoked. She has never used smokeless tobacco. She reports that she does not drink alcohol and does not use drugs.  Allergies   Allergen Reactions   Amoxicillin Hives    Mild hives only on arms. No severe reactions.   Oxycodone Itching   Duloxetine Rash   Hydrocodone Itching    History reviewed. No pertinent family history.   Prior to Admission medications   Medication Sig Start Date End Date Taking? Authorizing Provider  amLODipine (NORVASC) 5 MG tablet Take 5 mg by mouth daily. 07/12/16  Yes [provider]  baclofen (LIORESAL) 10 MG tablet Take 10 mg by mouth 2 (two) times daily. 12/14/20  Yes [provider]  Buprenorphine HCl 300 MCG FILM Place 1 strip under the tongue 2 (two) times daily. 12/25/18  Yes [provider]  celecoxib (CELEBREX) 200 MG capsule Take 200 mg by mouth daily. 06/13/21  Yes [provider]  diclofenac sodium (VOLTAREN) 1 % GEL APPLY 2 TO 4 GRAMS TOPICALLY 4 TIMES DAILY 08/02/18  Yes [provider]  hydrOXYzine (ATARAX/VISTARIL) 25 MG tablet Can take 1-2 tablets 3 times a day as needed for anxiety 01/02/18  Yes [provider]  lisinopril-hydrochlorothiazide (PRINZIDE,ZESTORETIC) 20-12.5 MG tablet Take 1 tablet by mouth daily. 07/12/16  Yes [provider]  meloxicam (MOBIC) 15 MG tablet Take 15 mg by mouth daily. 07/20/18  Yes [provider]  omeprazole (PRILOSEC) 20 MG capsule Take 20 mg by mouth daily. 03/19/18 06/15/21 Yes [provider]  pregabalin (LYRICA) 50 MG capsule Take 50 mg by mouth 3 (three) times daily. 12/22/20  Yes [provider]  rOPINIRole (REQUIP) 0.5 MG tablet Take 1 tablet (0.5  mg total) by mouth at bedtime. 05/17/17  Yes Fisher, Linden Dolin, PA-C  topiramate (TOPAMAX) 50 MG tablet Take 50 mg by mouth at bedtime. 08/01/18  Yes [provider]  venlafaxine XR (EFFEXOR XR) 37.5 MG 24 hr capsule Take 1 capsule (37.5 mg total) by mouth daily. 10/16/20 10/16/21 Yes Veronese, [place], MD  VICTOZA 18 MG/3ML SOPN 1.2 mg daily. 06/06/21  Yes [provider]  Vitamin D, Ergocalciferol,  (DRISDOL) 1.25 MG (50000 UT) CAPS capsule Take 50,000 Units by mouth once a week. 08/23/18  Yes [provider]  amLODipine (NORVASC) 10 MG tablet Take 10 mg by mouth daily. 06/06/21   [provider]  fluticasone (FLONASE) 50 MCG/ACT nasal spray Place 2 sprays into both nostrils daily. Patient not taking: Reported on 06/15/2021 01/02/21   Laurene Footman B, PA-C  gabapentin (NEURONTIN) 300 MG capsule Take 800 mg 3 (three) times daily by mouth. Patient not taking: Reported on 06/15/2021    [provider]  ondansetron (ZOFRAN ODT) 4 MG disintegrating tablet Take 1 tablet (4 mg total) by mouth every 8 (eight) hours as needed. Patient not taking: Reported on 06/15/2021 10/16/20   Rudene Re, MD  promethazine-dextromethorphan (PROMETHAZINE-DM) 6.25-15 MG/5ML syrup Take 5 mLs by mouth 4 (four) times daily as needed for cough. Patient not taking: Reported on 06/15/2021 01/02/21   Danton Clap, PA-C    Physical Exam: Vitals:   06/15/21 2000 06/15/21 2035 06/15/21 2115 06/15/21 2130  BP: 124/70 134/74  124/75  Pulse: 74 62 74   Resp:      Temp:  98.2 F (36.8 C)    TempSrc:  Oral    SpO2: 100% 100% 100%   Weight:      Height:        Constitutional: NAD, calm  Eyes: PERTLA, lids and conjunctivae normal ENMT: Mucous membranes are moist. Posterior pharynx clear of any exudate or lesions.   Neck: supple, no masses  Respiratory:  no wheezing, no crackles. No accessory muscle use.  Cardiovascular: S1 & S2 heard, regular rate and rhythm. No extremity edema.   Abdomen: soft, tender in epigastrium and RUQ. Bowel sounds active.  Musculoskeletal: no clubbing / cyanosis. No joint deformity upper and lower extremities.   Skin: no significant rashes, lesions, ulcers. Warm, dry, well-perfused. Neurologic: CN 2-12 grossly intact. Sensation intact. Moving all extremities. Alert and oriented.  Psychiatric: Pleasant. Cooperative.    Labs and Imaging on Admission: I have  personally reviewed following labs and imaging studies  CBC: Recent Labs  Lab 06/15/21 1439 06/15/21 1704  WBC 2.5* 2.4*  NEUTROABS  --  0.9*  HGB 13.1 12.1  HCT 45.6 41.3  MCV 88.2 86.8  PLT 220 854   Basic Metabolic Panel: Recent Labs  Lab 06/15/21 1439  NA 138  K 4.1  CL 108  CO2 25  GLUCOSE 82  BUN 19  CREATININE 0.81  CALCIUM 9.4   GFR: Estimated Creatinine Clearance: 94.5 mL/min (by C-G formula based on SCr of 0.81 mg/dL). Liver Function Tests: Recent Labs  Lab 06/15/21 1439  AST 898*  ALT 1,486*  ALKPHOS 175*  BILITOT 4.0*  PROT 7.1  ALBUMIN 4.0   Recent Labs  Lab 06/15/21 1439  LIPASE 40   No results for input(s): AMMONIA in the last 168 hours. Coagulation Profile: No results for input(s): INR, PROTIME in the last 168 hours. Cardiac Enzymes: No results for input(s): CKTOTAL, CKMB, CKMBINDEX, TROPONINI in the last 168 hours. BNP (last 3 results) No  results for input(s): PROBNP in the last 8760 hours. HbA1C: No results for input(s): HGBA1C in the last 72 hours. CBG: No results for input(s): GLUCAP in the last 168 hours. Lipid Profile: No results for input(s): CHOL, HDL, LDLCALC, TRIG, CHOLHDL, LDLDIRECT in the last 72 hours. Thyroid Function Tests: No results for input(s): TSH, T4TOTAL, FREET4, T3FREE, THYROIDAB in the last 72 hours. Anemia Panel: No results for input(s): VITAMINB12, FOLATE, FERRITIN, TIBC, IRON, RETICCTPCT in the last 72 hours. Urine analysis:    Component Value Date/Time   COLORURINE AMBER (A) 06/15/2021 1439   APPEARANCEUR CLOUDY (A) 06/15/2021 1439   APPEARANCEUR Clear 02/18/2013 0816   LABSPEC 1.014 06/15/2021 1439   LABSPEC 1.014 02/18/2013 0816   PHURINE 8.0 06/15/2021 1439   GLUCOSEU NEGATIVE 06/15/2021 1439   GLUCOSEU Negative 02/18/2013 0816   HGBUR NEGATIVE 06/15/2021 1439   BILIRUBINUR NEGATIVE 06/15/2021 1439   BILIRUBINUR Negative 02/18/2013 0816   KETONESUR NEGATIVE 06/15/2021 1439   PROTEINUR NEGATIVE  06/15/2021 1439   NITRITE NEGATIVE 06/15/2021 1439   LEUKOCYTESUR NEGATIVE 06/15/2021 1439   LEUKOCYTESUR Negative 02/18/2013 0816   Sepsis Labs: $[email or web address]'[id number]$ (procalcitonin:4,lacticidven:4) )No results found for this or any previous visit (from the past 240 hour(s)).   Radiological Exams on Admission: MR 3D Recon At Scanner  Result Date: 06/15/2021 CLINICAL DATA:  Jaundice, nausea, vomiting, right upper quadrant pain EXAM: MRI ABDOMEN WITH CONTRAST (WITH MRCP) TECHNIQUE: Multiplanar multisequence MR imaging of the abdomen was performed following the administration of intravenous contrast. Heavily T2-weighted images of the biliary and pancreatic ducts were obtained, and three-dimensional MRCP images were rendered by post processing. CONTRAST:  68mL GADAVIST GADOBUTROL 1 MMOL/ML IV SOLN COMPARISON:  Right upper quadrant ultrasound, 06/15/2021 FINDINGS: Lower chest: No acute findings. Hepatobiliary: Mild hepatic steatosis. No mass or other parenchymal abnormality identified. Tiny gallstones and sludge in the dependent gallbladder (series 4, image 23). Small volume pericholecystic fluid. Minimal intra and extrahepatic biliary ductal dilatation, common bile duct measuring up to 0.8 cm in caliber. Pancreas: No mass, inflammatory changes, or other parenchymal abnormality identified.No pancreatic ductal dilatation. Spleen:  Within normal limits in size and appearance. Adrenals/Urinary Tract: Normal adrenal glands. No renal masses or suspicious contrast enhancement identified. No evidence of hydronephrosis. Stomach/Bowel: Visualized portions within the abdomen are unremarkable. Vascular/Lymphatic: No pathologically enlarged lymph nodes identified. No abdominal aortic aneurysm demonstrated. Other:  None. Musculoskeletal: No suspicious osseous lesions identified. IMPRESSION: 1. Tiny gallstones and sludge in the dependent gallbladder. Small volume pericholecystic fluid. 2. Minimal intra and extrahepatic biliary ductal  dilatation, common bile duct measuring up to 0.8 cm in caliber. No choledocholithiasis or other obstructing lesion identified to the ampulla. 3. Findings are equivocal for acute cholecystitis, this appearance potentially caused by recently passed gallstone. 4. Mild hepatic steatosis. Electronically Signed   By: Delanna Ahmadi M.D.   On: 06/15/2021 19:36   MR ABDOMEN MRCP W WO CONTAST  Result Date: 06/15/2021 CLINICAL DATA:  Jaundice, nausea, vomiting, right upper quadrant pain EXAM: MRI ABDOMEN WITH CONTRAST (WITH MRCP) TECHNIQUE: Multiplanar multisequence MR imaging of the abdomen was performed following the administration of intravenous contrast. Heavily T2-weighted images of the biliary and pancreatic ducts were obtained, and three-dimensional MRCP images were rendered by post processing. CONTRAST:  38mL GADAVIST GADOBUTROL 1 MMOL/ML IV SOLN COMPARISON:  Right upper quadrant ultrasound, 06/15/2021 FINDINGS: Lower chest: No acute findings. Hepatobiliary: Mild hepatic steatosis. No mass or other parenchymal abnormality identified. Tiny gallstones and sludge in the dependent gallbladder (series 4, image 23). Small volume pericholecystic fluid.  Minimal intra and extrahepatic biliary ductal dilatation, common bile duct measuring up to 0.8 cm in caliber. Pancreas: No mass, inflammatory changes, or other parenchymal abnormality identified.No pancreatic ductal dilatation. Spleen:  Within normal limits in size and appearance. Adrenals/Urinary Tract: Normal adrenal glands. No renal masses or suspicious contrast enhancement identified. No evidence of hydronephrosis. Stomach/Bowel: Visualized portions within the abdomen are unremarkable. Vascular/Lymphatic: No pathologically enlarged lymph nodes identified. No abdominal aortic aneurysm demonstrated. Other:  None. Musculoskeletal: No suspicious osseous lesions identified. IMPRESSION: 1. Tiny gallstones and sludge in the dependent gallbladder. Small volume pericholecystic  fluid. 2. Minimal intra and extrahepatic biliary ductal dilatation, common bile duct measuring up to 0.8 cm in caliber. No choledocholithiasis or other obstructing lesion identified to the ampulla. 3. Findings are equivocal for acute cholecystitis, this appearance potentially caused by recently passed gallstone. 4. Mild hepatic steatosis. Electronically Signed   By: Delanna Ahmadi M.D.   On: 06/15/2021 19:36   US Abdomen Limited RUQ (LIVER/GB)  Result Date: 06/15/2021 CLINICAL DATA:  Pain right upper EXAM: ULTRASOUND ABDOMEN LIMITED RIGHT UPPER QUADRANT COMPARISON:  None. FINDINGS: Gallbladder: Sludge and small stones are seen in the lumen of the gallbladder. Technologist did not observe any tenderness over the gallbladder. There is no wall thickening in gallbladder. There is no fluid around the gallbladder. Common bile duct: Diameter: 7.7 mm. Distal common bile duct is not adequately visualized for evaluation. Liver: There is increased echogenicity in the liver suggesting fatty infiltration. No focal abnormality is seen in the visualized portions of liver. Portal vein is patent on color Doppler imaging with normal direction of blood flow towards the liver. Other: None. IMPRESSION: Sludge and stones are seen in the gallbladder. There are no sonographic signs of acute cholecystitis. Fatty liver. No other sonographic abnormality is seen in the right upper quadrant. Electronically Signed   By: Elmer Picker M.D.   On: 06/15/2021 17:13    Assessment/Plan   1. Biliary colic; elevated LFTs; ?cholangitis  - Presents with upper abdominal pain and N/V since am of 3/12  - Alk phos 175, AST 898, ALT 1486, and t bili 4.0 - MRCP with gallbladder sludge and stones, no choledocholithiasis or other obstructing lesion, and findings suggestive of recently passed stone  - No fever but leukopenia noted on admission  - Blood cultures were collected in ED, antibiotics started, and GI and surgery were consulted by ED  physician  - Continue bowel-rest, continue pain-control, continue antibiotics for now, repeat labs in am  2. Chronic pain  - Prescription database reviewed  - Continue home regimen, also requiring treatment for acute pain     DVT prophylaxis: SCDs  Code Status: Full  Level of Care: Level of care: Med-Surg Family Communication: daughter at bedside  Disposition Plan:  Patient is from: home  Anticipated d/c is to: TBD Anticipated d/c date is: 06/19/21  Patient currently: Pending repeat labs, GI consultation, likely cholecystectomy  Consults called: Surgery and GI consulted by ED  Admission status: inpatient     Vianne Bulls, MD [facility]  06/15/2021, 9:44 PM

## 2021-06-16 ENCOUNTER — Inpatient Hospital Stay: Payer: Medicare Other | Admitting: Anesthesiology

## 2021-06-16 ENCOUNTER — Other Ambulatory Visit: Payer: Self-pay

## 2021-06-16 ENCOUNTER — Encounter
Admission: EM | Disposition: A | Discharge status: Home / Self Care | Payer: Self-pay | Source: Home / Self Care | Attending: Internal Medicine

## 2021-06-16 ENCOUNTER — Inpatient Hospital Stay: Payer: Medicare Other

## 2021-06-16 ENCOUNTER — Encounter: Payer: Self-pay | Admitting: Family Medicine

## 2021-06-16 HISTORY — PX: INTRAOPERATIVE CHOLANGIOGRAM: SHX5230

## 2021-06-16 LAB — COMPREHENSIVE METABOLIC PANEL
ALT: 944 U/L — ABNORMAL HIGH (ref 0–44)
AST: 415 U/L — ABNORMAL HIGH (ref 15–41)
Albumin: 3.2 g/dL — ABNORMAL LOW (ref 3.5–5.0)
Alkaline Phosphatase: 131 U/L — ABNORMAL HIGH (ref 38–126)
Anion gap: 3 — ABNORMAL LOW (ref 5–15)
BUN: 12 mg/dL (ref 6–20)
CO2: 27 mmol/L (ref 22–32)
Calcium: 9.1 mg/dL (ref 8.9–10.3)
Chloride: 111 mmol/L (ref 98–111)
Creatinine, Ser: 0.69 mg/dL (ref 0.44–1.00)
GFR, Estimated: >60 mL/min (ref >60)
Glucose, Bld: 87 mg/dL (ref 70–99)
Potassium: 3.8 mmol/L (ref 3.5–5.1)
Sodium: 141 mmol/L (ref 135–145)
Total Bilirubin: 3.5 mg/dL — ABNORMAL HIGH (ref 0.3–1.2)
Total Protein: 5.7 g/dL — ABNORMAL LOW (ref 6.5–8.1)

## 2021-06-16 LAB — CBC
HCT: 36.9 % (ref 36.0–46.0)
Hemoglobin: 10.7 g/dL — ABNORMAL LOW (ref 12.0–15.0)
MCH: 25.5 pg — ABNORMAL LOW (ref 26.0–34.0)
MCHC: 29 g/dL — ABNORMAL LOW (ref 30.0–36.0)
MCV: 88.1 fL (ref 80.0–100.0)
Platelets: 202 10*3/uL (ref 150–400)
RBC: 4.19 MIL/uL (ref 3.87–5.11)
RDW: 15.8 % — ABNORMAL HIGH (ref 11.5–15.5)
WBC: 3 10*3/uL — ABNORMAL LOW (ref 4.0–10.5)
nRBC: 0 % (ref 0.0–0.2)

## 2021-06-16 LAB — HIV ANTIBODY (ROUTINE TESTING W REFLEX): HIV Screen 4th Generation wRfx: NONREACTIVE

## 2021-06-16 LAB — PATHOLOGIST SMEAR REVIEW

## 2021-06-16 LAB — PROTIME-INR
INR: 1.1 (ref 0.8–1.2)
Prothrombin Time: 13.8 seconds (ref 11.4–15.2)

## 2021-06-16 LAB — MAGNESIUM: Magnesium: 2 mg/dL (ref 1.7–2.4)

## 2021-06-16 SURGERY — CHOLECYSTECTOMY, ROBOT-ASSISTED, LAPAROSCOPIC
Anesthesia: General

## 2021-06-16 MED ORDER — ONDANSETRON HCL 4 MG/2ML IJ SOLN
INTRAMUSCULAR | Status: DC | PRN
Start: 2021-06-16 — End: 2021-06-16
  Administered 2021-06-16 (×2): 4 mg via INTRAVENOUS

## 2021-06-16 MED ORDER — FENTANYL CITRATE (PF) 100 MCG/2ML IJ SOLN
INTRAMUSCULAR | Status: AC
  Filled 2021-06-16: qty 2

## 2021-06-16 MED ORDER — IOHEXOL 180 MG/ML  SOLN
INTRAMUSCULAR | Status: DC | PRN
  Administered 2021-06-16: 30 mL

## 2021-06-16 MED ORDER — SUGAMMADEX SODIUM 200 MG/2ML IV SOLN
INTRAVENOUS | Status: DC | PRN
  Administered 2021-06-16: 300 mg via INTRAVENOUS

## 2021-06-16 MED ORDER — DEXAMETHASONE SODIUM PHOSPHATE 10 MG/ML IJ SOLN
INTRAMUSCULAR | Status: DC | PRN
Start: 2021-06-16 — End: 2021-06-16
  Administered 2021-06-16: 10 mg via INTRAVENOUS

## 2021-06-16 MED ORDER — KETOROLAC TROMETHAMINE 30 MG/ML IJ SOLN
INTRAMUSCULAR | Status: DC | PRN
  Administered 2021-06-16: 30 mg via INTRAVENOUS

## 2021-06-16 MED ORDER — GLYCOPYRROLATE 0.2 MG/ML IJ SOLN
INTRAMUSCULAR | Status: DC | PRN
Start: 2021-06-16 — End: 2021-06-16
  Administered 2021-06-16: .2 mg via INTRAVENOUS

## 2021-06-16 MED ORDER — HYDROMORPHONE HCL 1 MG/ML IJ SOLN
INTRAMUSCULAR | Status: AC
  Administered 2021-06-16: 0.5 mg via INTRAVENOUS
  Filled 2021-06-16: qty 1

## 2021-06-16 MED ORDER — MORPHINE SULFATE (PF) 2 MG/ML IV SOLN: 1.0000 mg | INTRAVENOUS | Status: DC | PRN

## 2021-06-16 MED ORDER — BUPIVACAINE-EPINEPHRINE 0.25% -1:200000 IJ SOLN
INTRAMUSCULAR | Status: DC | PRN
  Administered 2021-06-16: 20 mL

## 2021-06-16 MED ORDER — MIDAZOLAM HCL 2 MG/2ML IJ SOLN
INTRAMUSCULAR | Status: DC | PRN
  Administered 2021-06-16: 2 mg via INTRAVENOUS

## 2021-06-16 MED ORDER — ROCURONIUM BROMIDE 100 MG/10ML IV SOLN
INTRAVENOUS | Status: DC | PRN
  Administered 2021-06-16: 10 mg via INTRAVENOUS
  Administered 2021-06-16: 60 mg via INTRAVENOUS

## 2021-06-16 MED ORDER — ACETAMINOPHEN 10 MG/ML IV SOLN: 1000.0000 mg | Freq: Once | INTRAVENOUS | Status: DC | PRN

## 2021-06-16 MED ORDER — MORPHINE SULFATE (PF) 4 MG/ML IV SOLN
4.0000 mg | INTRAVENOUS | Status: DC | PRN
  Administered 2021-06-16: 4 mg via INTRAVENOUS
  Filled 2021-06-16: qty 1

## 2021-06-16 MED ORDER — SODIUM CHLORIDE 0.9 % IR SOLN
Status: DC | PRN
  Administered 2021-06-16: 800 mL

## 2021-06-16 MED ORDER — TRAMADOL HCL 50 MG PO TABS: 50.0000 mg | ORAL_TABLET | Freq: Four times a day (QID) | ORAL | Status: DC | PRN

## 2021-06-16 MED ORDER — PROPOFOL 10 MG/ML IV BOLUS
INTRAVENOUS | Status: DC | PRN
  Administered 2021-06-16: 150 mg via INTRAVENOUS

## 2021-06-16 MED ORDER — 0.9 % SODIUM CHLORIDE (POUR BTL) OPTIME
TOPICAL | Status: DC | PRN
  Administered 2021-06-16: 150 mL

## 2021-06-16 MED ORDER — MIDAZOLAM HCL 2 MG/2ML IJ SOLN
INTRAMUSCULAR | Status: AC
Start: 2021-06-16 — End: ?
  Filled 2021-06-16: qty 2

## 2021-06-16 MED ORDER — SCOPOLAMINE 1 MG/3DAYS TD PT72
1.0000 | MEDICATED_PATCH | Freq: Once | TRANSDERMAL | Status: DC
  Filled 2021-06-16: qty 1

## 2021-06-16 MED ORDER — HYDROMORPHONE HCL 1 MG/ML IJ SOLN
INTRAMUSCULAR | Status: AC
  Filled 2021-06-16: qty 1

## 2021-06-16 MED ORDER — ACETAMINOPHEN 10 MG/ML IV SOLN
INTRAVENOUS | Status: DC | PRN
  Administered 2021-06-16: 1000 mg via INTRAVENOUS

## 2021-06-16 MED ORDER — DROPERIDOL 2.5 MG/ML IJ SOLN
0.6250 mg | Freq: Once | INTRAMUSCULAR | Status: DC | PRN
  Filled 2021-06-16: qty 2

## 2021-06-16 MED ORDER — OXYCODONE HCL 5 MG/5ML PO SOLN: 5.0000 mg | Freq: Once | ORAL | Status: DC | PRN

## 2021-06-16 MED ORDER — LIDOCAINE HCL (CARDIAC) PF 100 MG/5ML IV SOSY
PREFILLED_SYRINGE | INTRAVENOUS | Status: DC | PRN
  Administered 2021-06-16: 100 mg via INTRAVENOUS

## 2021-06-16 MED ORDER — BUPRENORPHINE HCL 300 MCG BU FILM
300.0000 ug | ORAL_FILM | Freq: Two times a day (BID) | BUCCAL | Status: DC
  Administered 2021-06-16 – 2021-06-18 (×4): 300 ug via SUBLINGUAL
  Filled 2021-06-16 (×8): qty 1

## 2021-06-16 MED ORDER — ACETAMINOPHEN 10 MG/ML IV SOLN
INTRAVENOUS | Status: AC
  Filled 2021-06-16: qty 100

## 2021-06-16 MED ORDER — SCOPOLAMINE 1 MG/3DAYS TD PT72
MEDICATED_PATCH | TRANSDERMAL | Status: AC
Start: 2021-06-16 — End: 2021-06-17
  Filled 2021-06-16: qty 1

## 2021-06-16 MED ORDER — OXYCODONE HCL 5 MG PO TABS: 5.0000 mg | ORAL_TABLET | Freq: Once | ORAL | Status: DC | PRN

## 2021-06-16 MED ORDER — HYDROMORPHONE HCL 1 MG/ML IJ SOLN
0.2500 mg | INTRAMUSCULAR | Status: DC | PRN
  Administered 2021-06-16 (×2): 0.5 mg via INTRAVENOUS

## 2021-06-16 MED ORDER — BUPIVACAINE-EPINEPHRINE (PF) 0.25% -1:200000 IJ SOLN
INTRAMUSCULAR | Status: AC
  Filled 2021-06-16: qty 30

## 2021-06-16 MED ORDER — INDOCYANINE GREEN 25 MG IV SOLR
1.2500 mg | Freq: Once | INTRAVENOUS | Status: AC
Start: 2021-06-16 — End: 2021-06-16
  Administered 2021-06-16: 1.25 mg via INTRAVENOUS
  Filled 2021-06-16: qty 0.5

## 2021-06-16 MED ORDER — FENTANYL CITRATE (PF) 100 MCG/2ML IJ SOLN
INTRAMUSCULAR | Status: DC | PRN
  Administered 2021-06-16 (×4): 50 ug via INTRAVENOUS

## 2021-06-16 SURGICAL SUPPLY — 53 items
BAG INFUSER PRESSURE 100CC (MISCELLANEOUS) ×1 IMPLANT
BAG RETRIEVAL 10 (BASKET) ×1
BLADE SURG SZ11 CARB STEEL (BLADE) ×2 IMPLANT
CANNULA REDUC XI 12-8 STAPL (CANNULA) ×1
CANNULA REDUCER 12-8 DVNC XI (CANNULA) ×1 IMPLANT
CATH REDDICK CHOLANGI 4FR 50CM (CATHETERS) ×1 IMPLANT
CLIP LIGATING HEM O LOK PURPLE (MISCELLANEOUS) ×1 IMPLANT
CLIP LIGATING HEMO O LOK GREEN (MISCELLANEOUS) ×2 IMPLANT
DERMABOND ADVANCED (GAUZE/BANDAGES/DRESSINGS) ×1
DERMABOND ADVANCED .7 DNX12 (GAUZE/BANDAGES/DRESSINGS) ×1 IMPLANT
DRAPE ARM DVNC X/XI (DISPOSABLE) ×4 IMPLANT
DRAPE C-ARM XRAY 36X54 (DRAPES) ×1 IMPLANT
DRAPE COLUMN DVNC XI (DISPOSABLE) ×1 IMPLANT
DRAPE DA VINCI XI ARM (DISPOSABLE) ×4
DRAPE DA VINCI XI COLUMN (DISPOSABLE) ×1
ELECT REM PT RETURN 9FT ADLT (ELECTROSURGICAL) ×2
ELECTRODE REM PT RTRN 9FT ADLT (ELECTROSURGICAL) ×1 IMPLANT
GLOVE SURG ENC MOIS LTX SZ6.5 (GLOVE) ×4 IMPLANT
GLOVE SURG UNDER POLY LF SZ6.5 (GLOVE) ×4 IMPLANT
GOWN STRL REUS W/ TWL LRG LVL3 (GOWN DISPOSABLE) ×3 IMPLANT
GOWN STRL REUS W/TWL LRG LVL3 (GOWN DISPOSABLE) ×3
GRASPER SUT TROCAR 14GX15 (MISCELLANEOUS) ×2 IMPLANT
IRRIGATOR SUCT 8 DISP DVNC XI (IRRIGATION / IRRIGATOR) IMPLANT
IRRIGATOR SUCTION 8MM XI DISP (IRRIGATION / IRRIGATOR) ×1
IV CATH ANGIO 12GX3 LT BLUE (NEEDLE) ×1 IMPLANT
IV NS 1000ML (IV SOLUTION) ×1
IV NS 1000ML BAXH (IV SOLUTION) IMPLANT
KIT PINK PAD W/HEAD ARE REST (MISCELLANEOUS) ×2 IMPLANT
KIT PINK PAD W/HEAD ARM REST (MISCELLANEOUS) ×1 IMPLANT
LABEL OR SOLS (LABEL) ×2 IMPLANT
MANIFOLD NEPTUNE II (INSTRUMENTS) ×2 IMPLANT
NDL INSUFFLATION 14GA 120MM (NEEDLE) ×1 IMPLANT
NEEDLE HYPO 22GX1.5 SAFETY (NEEDLE) ×2 IMPLANT
NEEDLE INSUFFLATION 14GA 120MM (NEEDLE) ×2 IMPLANT
NS IRRIG 500ML POUR BTL (IV SOLUTION) ×2 IMPLANT
OBTURATOR OPTICAL STANDARD 8MM (TROCAR) ×1
OBTURATOR OPTICAL STND 8 DVNC (TROCAR) ×1
OBTURATOR OPTICALSTD 8 DVNC (TROCAR) ×1 IMPLANT
PACK LAP CHOLECYSTECTOMY (MISCELLANEOUS) ×2 IMPLANT
SEAL CANN UNIV 5-8 DVNC XI (MISCELLANEOUS) ×3 IMPLANT
SEAL XI 5MM-8MM UNIVERSAL (MISCELLANEOUS) ×3
SET TUBE SMOKE EVAC HIGH FLOW (TUBING) ×2 IMPLANT
SOLUTION ELECTROLUBE (MISCELLANEOUS) ×2 IMPLANT
SPONGE T-LAP 4X18 ~~LOC~~+RFID (SPONGE) ×1 IMPLANT
STAPLER CANNULA SEAL DVNC XI (STAPLE) ×1 IMPLANT
STAPLER CANNULA SEAL XI (STAPLE) ×1
SUT MNCRL 4-0 (SUTURE) ×1
SUT MNCRL 4-0 27XMFL (SUTURE) ×1
SUT VICRYL 0 AB UR-6 (SUTURE) ×2 IMPLANT
SUTURE MNCRL 4-0 27XMF (SUTURE) ×1 IMPLANT
SYS BAG RETRIEVAL 10MM (BASKET) ×1
SYSTEM BAG RETRIEVAL 10MM (BASKET) ×1 IMPLANT
WATER STERILE IRR 500ML POUR (IV SOLUTION) ×2 IMPLANT

## 2021-06-16 NOTE — Anesthesia Preprocedure Evaluation (Addendum)
Anesthesia Evaluation  ?Patient identified by MRN, date of birth, ID band ?Patient awake ? ? ? ?Reviewed: ?Allergy & Precautions, NPO status , Patient's Chart, lab work & pertinent test results ? ?Airway ?Mallampati: III ? ?TM Distance: >3 FB ?Neck ROM: full ? ? ? Dental ?no notable dental hx. ? ?  ?Pulmonary ?sleep apnea ,  ?  ?Pulmonary exam normal ? ? ? ? ? ? ? Cardiovascular ?hypertension, Pt. on medications ?Normal cardiovascular exam ? ? ?  ?Neuro/Psych ?PSYCHIATRIC DISORDERS Anxiety  Neuromuscular disease (chronic back pain)   ? GI/Hepatic ?hiatal hernia (Small from EGD 2020), GERD  Controlled and Medicated,Elevated LFTs ?Cholecystitis ? ?EGD 10/20- [facility]- preop for bariatric surgery- small hiatal hernia, gastritis.  ?  ?Endo/Other  ?negative endocrine ROS ? Renal/GU ?  ? ?  ?Musculoskeletal ? ?(+) Arthritis ,  ? Abdominal ?(+) + obese,   ?Peds ? Hematology ? ?(+) Blood dyscrasia, anemia , Hgb 10.7   ?Anesthesia Other Findings ?Past Medical History: ?No date: Anxiety ?No date: Chronic back pain ?No date: H/O degenerative disc disease ?No date: Hypertension ?No date: Sciatica ?Chronic opioid use- buprenorphine ? ?Past Surgical History: ?No date: breast bioosy ?No date: HYSTEROSCOPY ?04/09/2021: TOTAL KNEE ARTHROPLASTY; Right ? ?BMI   ? Body Mass Index: 37.97 kg/m?  ?  ? ? Reproductive/Obstetrics ?negative OB ROS ? ?  ? ? ? ? ? ? ? ? ? ? ? ? ? ?  ?  ? ? ? ? ? ? ?Anesthesia Physical ?Anesthesia Plan ? ?ASA: 2 ? ?Anesthesia Plan: General ETT  ? ?Post-op Pain Management: Toradol IV (intra-op)* and Regional block*  ? ?Induction: Intravenous ? ?PONV Risk Score and Plan: Ondansetron, Dexamethasone, Midazolam and Scopolamine patch - Pre-op ? ?Airway Management Planned: Oral ETT ? ?Additional Equipment:  ? ?Intra-op Plan:  ? ?Post-operative Plan: Extubation in OR ? ?Informed Consent: I have reviewed the patients History and Physical, chart, labs and discussed the procedure including the  risks, benefits and alternatives for the proposed anesthesia with the patient or authorized representative who has indicated his/her understanding and acceptance.  ? ? ? ?Dental advisory given ? ?Plan Discussed with: Anesthesiologist, CRNA and Surgeon ? ?Anesthesia Plan Comments:   ? ? ? ? ?Anesthesia Quick Evaluation ? ?

## 2021-06-16 NOTE — Transfer of Care (Signed)
Immediate Anesthesia Transfer of Care Note ? ?Patient: Ariel Davis ? ?Procedure(s) Performed: XI ROBOTIC ASSISTED LAPAROSCOPIC CHOLECYSTECTOMY ?INTRAOPERATIVE CHOLANGIOGRAM ?INDOCYANINE GREEN FLUORESCENCE IMAGING (ICG) ? ?Patient Location: PACU ? ?Anesthesia Type:General ? ?Level of Consciousness: drowsy and patient cooperative ? ?Airway & Oxygen Therapy: Patient Spontanous Breathing and Patient connected to face mask oxygen ? ?Post-op Assessment: Report given to RN and Post -op Vital signs reviewed and stable ? ?Post vital signs: Reviewed and stable ? ?Last Vitals:  ?Vitals Value Taken Time  ?BP 141/70 06/16/21 1522  ?Temp    ?Pulse 83 06/16/21 1525  ?Resp 24 06/16/21 1525  ?SpO2 100 % 06/16/21 1525  ?Vitals shown include unvalidated device data. ? ?Last Pain:  ?Vitals:  ? 06/16/21 1218  ?TempSrc: Oral  ?PainSc: 6   ?   ? ?  ? ?Complications: No notable events documented. ?

## 2021-06-16 NOTE — Consult Note (Signed)
Consultation ? ?Referring Provider:      ?Admit date ?Consult date         ?Reason for Consultation:      ?       ? HPI:   ?Ariel Davis is a 56 y.o. female with medical history significant for hypertension, chronic pain, and anxiety. States she was doing well until Saturday, in which she developed bad upper abdominal pain along with intractable nausea and vomiting- this continued through Monday and then she came to the ED, found to have significantly elevated transaminases and hyperbilirubinemia, ound to have several small gallstones and sludge, likely has passed a gallstone;subsequently admitted as per H&P.  Liver enzymes improved today but still quite elevated. She has been afebrile, note some leukopenia. Is on ceftriaxone. Says her nausea has currently improved however the epigastric pain persists- CCY is planned with Dr Maia Planintron; she has been NPO. ?States she had a similar but less severe episode several years ago but it was not like the current issue. Otherwise denies any significant GI problems- has gerd but states this is well controlled on her daily omeprazole. States her father had CCY due to gallstones, otherwise denies family hsitory of GI issues. No known liver problems, denies starting any new medications lately and says she does not take herbals/etoh/illicits.  Does have chronic pain- on chronic pain meds.  ?Had some constipation with current illness but denies any bowel issues. Has no further GI concerns ? ?PREVIOUS ENDOSCOPIES:            ?EGD 10/20- [facility]- preop for bariatric surgery- small hiatal hernia, gastritis. No h pylori/dysplasia/malignancy ?Colonoscopy 07/19/17- [facility]- hemorrhoids, otherwise normal exam. ? ?Past Medical History:  ?Diagnosis Date  ? Anxiety   ? Chronic back pain   ? H/O degenerative disc disease   ? Hypertension   ? Sciatica   ? ? ?History reviewed. Rt TKR-1/23 [facility], breast biopsy, hysteroscopy ? ?History reviewed.  Father with CCY. No known family history of colorectal  cancer/colon polyps ? ?Social History  ? ?Tobacco Use  ? Smoking status: Never  ? Smokeless tobacco: Never  ?Vaping Use  ? Vaping Use: Never used  ?Substance Use Topics  ? Alcohol use: No  ? Drug use: No  ? ? ?Prior to Admission medications   ?Medication Sig Start Date End Date Taking? Authorizing Provider  ?amLODipine (NORVASC) 5 MG tablet Take 5 mg by mouth daily. 07/12/16  Yes [provider]  ?baclofen (LIORESAL) 10 MG tablet Take 10 mg by mouth 2 (two) times daily. 12/14/20  Yes [provider]  ?Buprenorphine HCl 300 MCG FILM Place 1 strip under the tongue 2 (two) times daily. 12/25/18  Yes [provider]  ?celecoxib (CELEBREX) 200 MG capsule Take 200 mg by mouth daily. 06/13/21  Yes [provider]  ?diclofenac sodium (VOLTAREN) 1 % GEL APPLY 2 TO 4 GRAMS TOPICALLY 4 TIMES DAILY 08/02/18  Yes [provider]  ?hydrOXYzine (ATARAX/VISTARIL) 25 MG tablet Can take 1-2 tablets 3 times a day as needed for anxiety 01/02/18  Yes [provider]  ?lisinopril-hydrochlorothiazide (PRINZIDE,ZESTORETIC) 20-12.5 MG tablet Take 1 tablet by mouth daily. 07/12/16  Yes [provider]  ?meloxicam (MOBIC) 15 MG tablet Take 15 mg by mouth daily. 07/20/18  Yes [provider]  ?omeprazole (PRILOSEC) 20 MG capsule Take 20 mg by mouth daily. 03/19/18 06/15/21 Yes [provider]  ?pregabalin (LYRICA) 50 MG capsule Take 50 mg by mouth 3 (three) times daily. 12/22/20  Yes  [provider]  ?rOPINIRole (REQUIP) 0.5 MG tablet Take 1 tablet (0.5 mg total) by mouth at bedtime. 05/17/17  Yes Fisher, Roselyn Bering, PA-C  ?topiramate (TOPAMAX) 50 MG tablet Take 50 mg by mouth at bedtime. 08/01/18  Yes [provider]  ?venlafaxine XR (EFFEXOR XR) 37.5 MG 24 hr capsule Take 1 capsule (37.5 mg total) by mouth daily. 10/16/20 10/16/21 Yes Veronese, [place], MD  ?Verdis Prime 18 MG/3ML SOPN 1.2 mg daily. 06/06/21  Yes [provider]  ?Vitamin D, Ergocalciferol,  (DRISDOL) 1.25 MG (50000 UT) CAPS capsule Take 50,000 Units by mouth once a week. 08/23/18  Yes [provider]  ?amLODipine (NORVASC) 10 MG tablet Take 10 mg by mouth daily. 06/06/21   [provider]  ?fluticasone (FLONASE) 50 MCG/ACT nasal spray Place 2 sprays into both nostrils daily. ?Patient not taking: Reported on 06/15/2021 01/02/21   Eusebio Friendly B, PA-C  ?gabapentin (NEURONTIN) 300 MG capsule Take 800 mg 3 (three) times daily by mouth. ?Patient not taking: Reported on 06/15/2021    [provider]  ?ondansetron (ZOFRAN ODT) 4 MG disintegrating tablet Take 1 tablet (4 mg total) by mouth every 8 (eight) hours as needed. ?Patient not taking: Reported on 06/15/2021 10/16/20   Nita Sickle, MD  ?promethazine-dextromethorphan (PROMETHAZINE-DM) 6.25-15 MG/5ML syrup Take 5 mLs by mouth 4 (four) times daily as needed for cough. ?Patient not taking: Reported on 06/15/2021 01/02/21   Shirlee Latch, PA-C  ? ? ?Current Facility-Administered Medications  ?Medication Dose Route Frequency Provider Last Rate Last Admin  ? 0.9 %  sodium chloride infusion   Intravenous Continuous Opyd, Lavone Neri, MD 85 mL/hr at 06/16/21 0120 New Bag at 06/16/21 0120  ? amLODipine (NORVASC) tablet 5 mg  5 mg Oral Daily Opyd, Lavone Neri, MD      ? baclofen (LIORESAL) tablet 10 mg  10 mg Oral BID Opyd, Lavone Neri, MD   10 mg at 06/16/21 0136  ? Buprenorphine HCl FILM 1 strip  1 strip Sublingual BID Opyd, Lavone Neri, MD      ? cefTRIAXone (ROCEPHIN) 2 g in sodium chloride 0.9 % 100 mL IVPB  2 g Intravenous Q24H Opyd, Lavone Neri, MD      ? diclofenac sodium (VOLTAREN) 1 % transdermal gel 4 g  4 g Topical QID Opyd, Lavone Neri, MD   4 g at 06/16/21 0216  ? hydrOXYzine (ATARAX) tablet 25-50 mg  25-50 mg Oral TID PRN Opyd, Lavone Neri, MD      ? indocyanine green (IC-GREEN) injection 1.25 mg  1.25 mg Intravenous Once Carolan Shiver, MD      ? metroNIDAZOLE (FLAGYL) IVPB 500 mg  500 mg Intravenous Q12H Opyd, Lavone Neri, MD       ? morphine (PF) 2 MG/ML injection 1-2 mg  1-2 mg Intravenous Q3H PRN Sreenath, Sudheer B, MD      ? pantoprazole (PROTONIX) EC tablet 40 mg  40 mg Oral Daily Opyd, Lavone Neri, MD   40 mg at 06/16/21 0136  ? pregabalin (LYRICA) capsule 50 mg  50 mg Oral TID Briscoe Deutscher, MD   50 mg at 06/16/21 0136  ? prochlorperazine (COMPAZINE) injection 10 mg  10 mg Intravenous Q4H PRN Opyd, Lavone Neri, MD      ? rOPINIRole (REQUIP) tablet 0.5 mg  0.5 mg Oral QHS Opyd, Lavone Neri, MD   0.5 mg at 06/16/21 0216  ? topiramate (TOPAMAX) tablet 50 mg  50 mg Oral QHS Opyd, Lavone Neri, MD  50 mg at 06/16/21 0216  ? venlafaxine XR (EFFEXOR-XR) 24 hr capsule 37.5 mg  37.5 mg Oral Daily Opyd, Lavone Neri, MD   37.5 mg at 06/16/21 0216  ? ? ?Allergies as of 06/15/2021 - Review Complete 06/15/2021  ?Allergen Reaction Noted  ? Amoxicillin Hives 08/01/2018  ? Oxycodone Itching 02/18/2017  ? Duloxetine Rash 09/18/2017  ? Hydrocodone Itching 12/27/2019  ? ? ? ?Review of Systems:    ?All systems reviewed and negative except where noted in HPI. ? ?Gen: Denies any fever, chills, sweats, anorexia, fatigue, weakness, malaise, weight loss, and sleep disorder ?CV: Denies chest pain, angina, palpitations, syncope, orthopnea, PND, peripheral edema, and claudication. ?Resp: Denies dyspnea at rest, dyspnea with exercise, cough, sputum, wheezing, coughing up blood, and pleurisy. ?GI: Denies vomiting blood, jaundice, and fecal incontinence.   Denies dysphagia or odynophagia. ?GU : Denies urinary burning, blood in urine, urinary frequency, urinary hesitancy, nocturnal urination, and urinary incontinence. ?MS: Denies joint pain, limitation of movement, and swelling, stiffness, low back pain, extremity pain. Denies muscle weakness, cramps, atrophy.  ?Derm: Denies rash, itching, dry skin, hives, moles, warts, or unhealing ulcers.  ?Psych: Denies depression, anxiety, memory loss, suicidal ideation, hallucinations, paranoia, and confusion. ?Heme: Denies bruising,  bleeding, and enlarged lymph nodes. ?Neuro:  Denies any headaches, dizziness, paresthesias. ?Endo:  Denies any problems with DM, thyroid, adrenal function. ? ? ? Physical Exam:  ?Vital signs in last 24 hours:

## 2021-06-16 NOTE — Plan of Care (Signed)

## 2021-06-16 NOTE — Anesthesia Procedure Notes (Signed)
Procedure Name: Intubation ?Date/Time: 06/16/2021 1:07 PM ?Performed by: Mohammed Kindle, CRNA ?Pre-anesthesia Checklist: Patient identified, Emergency Drugs available, Suction available and Patient being monitored ?Patient Re-evaluated:Patient Re-evaluated prior to induction ?Oxygen Delivery Method: Circle system utilized ?Preoxygenation: Pre-oxygenation with 100% oxygen ?Induction Type: IV induction ?Ventilation: Mask ventilation without difficulty ?Laryngoscope Size: McGraph and 3 ?Grade View: Grade I ?Tube type: Oral ?Number of attempts: 1 ?Airway Equipment and Method: Stylet and Oral airway ?Placement Confirmation: ETT inserted through vocal cords under direct vision, positive ETCO2 and breath sounds checked- equal and bilateral ?Secured at: 21 cm ?Tube secured with: Tape ?Dental Injury: Teeth and Oropharynx as per pre-operative assessment  ? ? ? ? ?

## 2021-06-16 NOTE — H&P (View-Only) (Signed)
Patient ID: Ariel Davis, female   DOB: 05/12/1965, 56 y.o.   MRN: [id number] ?    SURGICAL PROGRESS NOTE  ? ?Hospital Day(s): 1.  ? ?Interval History: Patient seen and examined, no acute events or new complaints overnight. Patient reports some improvement of the abdominal pain but still with epigastric pain.  No pain radiation.  Elevated with gastric pain medication.  Aggravating factor is applying pressure.  Patient denies any nausea or vomiting.  Patient has been NPO. ? ?Vital signs in last 24 hours: [min-max] current  ?Temp:  [98.1 ?F (36.7 ?C)-98.7 ?F (37.1 ?C)] 98.7 ?F (37.1 ?C) (03/14 0437) ?Pulse Rate:  [62-78] 68 (03/14 0437) ?Resp:  [18-20] 18 (03/14 0437) ?BP: (107-134)/(61-76) 112/70 (03/14 0437) ?SpO2:  [96 %-100 %] 96 % (03/14 0437) ?Weight:  [106.7 kg-108.9 kg] 106.7 kg (03/14 0500)     Height: 5' 6" (167.6 cm) Weight: 106.7 kg BMI (Calculated): 37.99  ? ?Physical Exam:  ?Constitutional: alert, cooperative and no distress  ?Respiratory: breathing non-labored at rest  ?Cardiovascular: regular rate and sinus rhythm  ?Gastrointestinal: soft, mildly-tender, and non-distended ? ?Labs:  ?CBC Latest Ref Rng & Units 06/16/2021 06/15/2021 06/15/2021  ?WBC 4.0 - 10.5 K/uL 3.0(L) 2.4(L) 2.5(L)  ?Hemoglobin 12.0 - 15.0 g/dL 10.7(L) 12.1 13.1  ?Hematocrit 36.0 - 46.0 % 36.9 41.3 45.6  ?Platelets 150 - 400 K/uL 202 221 220  ? ?CMP Latest Ref Rng & Units 06/16/2021 06/15/2021 10/15/2020  ?Glucose 70 - 99 mg/dL 87 82 93  ?BUN 6 - 20 mg/dL 12 19 13  ?Creatinine 0.44 - 1.00 mg/dL 0.69 0.81 0.82  ?Sodium 135 - 145 mmol/L 141 138 143  ?Potassium 3.5 - 5.1 mmol/L 3.8 4.1 3.4(L)  ?Chloride 98 - 111 mmol/L 111 108 113(H)  ?CO2 22 - 32 mmol/L 27 25 23  ?Calcium 8.9 - 10.3 mg/dL 9.1 9.4 9.2  ?Total Protein 6.5 - 8.1 g/dL 5.7(L) 7.1 6.2(L)  ?Total Bilirubin 0.3 - 1.2 mg/dL 3.5(H) 4.0(H) 1.0  ?Alkaline Phos 38 - 126 U/L 131(H) 175(H) 31(L)  ?AST 15 - 41 U/L 415(H) 898(H) 31  ?ALT 0 - 44 U/L 944(H) 1,486(H) 29  ? ? ?Imaging studies:  No new pertinent imaging studies ? ? ?Assessment/Plan:  ?56 y.o. female with choledocholithiasis and cholecystitis. ? ?-The fact that patient presented with abundant amount of sludge and tiny stones in the gallbladder, dilated common bile duct of 0.8 cm and elevated bilirubin is consistent with choledocholithiasis.  MRCP negative for obvious filling defect in the common bile duct. ?-I personally evaluated the images and there could be minimal sludge that is nonobstructing at this moment. ?-Today the bilirubin started to trend down.  This is consistent with suspected prostate stones/sludge. ?-I discussed the case with gastroenterologist and recommendation was to proceed with cholecystectomy with intraoperative cholangiogram.  If there is any filling defect identified on the cholangiogram she could be considered for ERCP ?-I discussed with patient the surgical technique of cholecystectomy and intraoperative cholangiogram, the goal of the surgery and the risk.  I discussed with surgery the risks including bleeding, infection, injury to common bile duct, bile leak, need of drains, retained stone, risk of anesthesia, among others.  The patient reports she understood and agreed to proceed. ? ?Maddeline Roorda Cintr?n-D?az, MD ? ? ? ?

## 2021-06-16 NOTE — Interval H&P Note (Signed)
History and Physical Interval Note: ? ?06/16/2021 ?12:27 PM ? ?Ariel Davis  has presented today for surgery, with the diagnosis of Cholecystitis.  The various methods of treatment have been discussed with the patient and family. After consideration of risks, benefits and other options for treatment, the patient has consented to  Procedure(s): ?XI ROBOTIC ASSISTED LAPAROSCOPIC CHOLECYSTECTOMY (N/A) ?INTRAOPERATIVE CHOLANGIOGRAM (N/A) ?INDOCYANINE GREEN FLUORESCENCE IMAGING (ICG) (N/A) as a surgical intervention.  The patient's history has been reviewed, patient examined, no change in status, stable for surgery.  I have reviewed the patient's chart and labs.  Questions were answered to the patient's satisfaction.   ? ? ?Ariel Davis ? ? ?

## 2021-06-16 NOTE — Progress Notes (Signed)
Patient ID: Ariel Davis, female   DOB: 02-07-1966, 56 y.o.   MRN: [id number] ?    SURGICAL PROGRESS NOTE  ? ?Hospital Day(s): 1.  ? ?Interval History: Patient seen and examined, no acute events or new complaints overnight. Patient reports some improvement of the abdominal pain but still with epigastric pain.  No pain radiation.  Elevated with gastric pain medication.  Aggravating factor is applying pressure.  Patient denies any nausea or vomiting.  Patient has been NPO. ? ?Vital signs in last 24 hours: [min-max] current  ?Temp:  [98.1 ?F (36.7 ?C)-98.7 ?F (37.1 ?C)] 98.7 ?F (37.1 ?C) (03/14 5188) ?Pulse Rate:  [62-78] 68 (03/14 0437) ?Resp:  [18-20] 18 (03/14 0437) ?BP: (107-134)/(61-76) 112/70 (03/14 0437) ?SpO2:  [96 %-100 %] 96 % (03/14 0437) ?Weight:  [106.7 kg-108.9 kg] 106.7 kg (03/14 0500)     Height: 5\' 6"  (167.6 cm) Weight: 106.7 kg BMI (Calculated): 37.99  ? ?Physical Exam:  ?Constitutional: alert, cooperative and no distress  ?Respiratory: breathing non-labored at rest  ?Cardiovascular: regular rate and sinus rhythm  ?Gastrointestinal: soft, mildly-tender, and non-distended ? ?Labs:  ?CBC Latest Ref Rng & Units 06/16/2021 06/15/2021 06/15/2021  ?WBC 4.0 - 10.5 K/uL 3.0(L) 2.4(L) 2.5(L)  ?Hemoglobin 12.0 - 15.0 g/dL 10.7(L) 12.1 13.1  ?Hematocrit 36.0 - 46.0 % 36.9 41.3 45.6  ?Platelets 150 - 400 K/uL 202 221 220  ? ?CMP Latest Ref Rng & Units 06/16/2021 06/15/2021 10/15/2020  ?Glucose 70 - 99 mg/dL 87 82 93  ?BUN 6 - 20 mg/dL 12 19 13   ?Creatinine 0.44 - 1.00 mg/dL 10/17/2020 4.16  ?Sodium 135 - 145 mmol/L 141 138 143  ?Potassium 3.5 - 5.1 mmol/L 3.8 4.1 3.4(L)  ?Chloride 98 - 111 mmol/L 111 108 113(H)  ?CO2 22 - 32 mmol/L 27 25 23   ?Calcium 8.9 - 10.3 mg/dL 9.1 9.4 9.2  ?Total Protein 6.5 - 8.1 g/dL 6.06) 7.1 3.01)  ?Total Bilirubin 0.3 - 1.2 mg/dL ) 4.0(H) 1.0  ?Alkaline Phos 38 - 126 U/L 131(H) 175(H) 31(L)  ?AST 15 - 41 U/L 415(H) 898(H) 31  ?ALT 0 - 44 U/L 944(H) 1,486(H) 29  ? ? ?Imaging studies:  No new pertinent imaging studies ? ? ?Assessment/Plan:  ?56 y.o. female with choledocholithiasis and cholecystitis. ? ?-The fact that patient presented with abundant amount of sludge and tiny stones in the gallbladder, dilated common bile duct of 0.8 cm and elevated bilirubin is consistent with choledocholithiasis.  MRCP negative for obvious filling defect in the common bile duct. ?-I personally evaluated the images and there could be minimal sludge that is nonobstructing at this moment. ?-Today the bilirubin started to trend down.  This is consistent with suspected prostate stones/sludge. ?-I discussed the case with gastroenterologist and recommendation was to proceed with cholecystectomy with intraoperative cholangiogram.  If there is any filling defect identified on the cholangiogram she could be considered for ERCP ?-I discussed with patient the surgical technique of cholecystectomy and intraoperative cholangiogram, the goal of the surgery and the risk.  I discussed with surgery the risks including bleeding, infection, injury to common bile duct, bile leak, need of drains, retained stone, risk of anesthesia, among others.  The patient reports she understood and agreed to proceed. ? ?0.9(N, MD ? ? ? ?

## 2021-06-16 NOTE — Progress Notes (Signed)
?PROGRESS NOTE ? ? ? ?Ariel Davis  NWG:[id number] DOB: 16-Jan-1966 DOA: 06/15/2021 ?PCP: Herschell Dimes, NP  ? ? ?Brief Narrative:  ? 56 y.o. female with medical history significant for hypertension, chronic pain, and anxiety, presenting to the emergency department with abdominal pain, nausea, and vomiting.  The patient reports that she had been in her usual state until yesterday morning when she developed cute onset of pain across the upper abdomen with nausea and recurrent episodes of nonbloody vomiting.  Pain has been constant, localized to the upper abdomen, and not alleviated with Gas-X or Pepto-Bismol.  She reports having a similar experience years ago she believes resolved on her own and she remembers that it may have been attributed to gallstones. ? ?Patient had significant elevation of liver function tests as well as bilirubin.  MRCP notable for gallbladder sludge with no evidence of obstructing stone.  Suspicion that patient had a passed stone.  Subsequent LFTs demonstrate downtrend.  General surgery consulted and planning to take patient for cholecystectomy with intraoperative cholangiogram 3/14 ? ? ?Assessment & Plan: ?  ?Principal Problem: ?  Elevated LFTs ?Active Problems: ?  Chronic back pain ?  HTN (hypertension) ? ?Biliary colic ?Elevated LFTs ?Suspected choledocholithiasis now passed stone ?GI and general surgery involved in care ?No indication for ERCP as LFTs are trending down ?Plan: ?OR today with general surgery for cholecystectomy and intraoperative cholangiogram ?If any filling defect identified on the cholangiogram will consider ERCP ?N.p.o. ?IVF ?Empiric antibiotics ?As needed pain control ?Monitor vitals and fever curve ? ?Chronic pain ?Pharmacy consulted ?Patient okay to start home pain regimen in hospital ? ?DVT prophylaxis: SCD ?Code Status: Full ?Family Communication: None today ?Disposition Plan: Status is: Inpatient ?Remains inpatient appropriate because: Elevated LFTs, possible  choledocholithiasis.  Plan for cholecystectomy and intraoperative cholangiogram with general surgery today ? ? ?Level of care: Med-Surg ? ?Consultants:  ?General surgery ?GI ? ?Procedures:  ?Cholecystectomy with intraoperative cholangiogram 3/14 ? ?Antimicrobials: ?Ceftriaxone ?Metronidazole ? ? ?Subjective: ?Seen and examined.  Resting in bed.  No visible distress.  Does endorse pain to right upper quadrant. ? ?Objective: ?Vitals:  ? 06/16/21 0437 06/16/21 0500 06/16/21 0808 06/16/21 1218  ?BP: 112/70  (!) 107/58 135/81  ?Pulse: 68  65 63  ?Resp: 18  16 14   ?Temp: 98.7 ?F (37.1 ?C)  98.6 ?F (37 ?C) 97.9 ?F (36.6 ?C)  ?TempSrc: Oral   Oral  ?SpO2: 96%  97% 98%  ?Weight:  106.7 kg    ?Height:      ? ? ?Intake/Output Summary (Last 24 hours) at 06/16/2021 1520 ?Last data filed at 06/16/2021 1507 ?Gross per 24 hour  ?Intake 2735.51 ml  ?Output 25 ml  ?Net 2710.51 ml  ? ?Filed Weights  ? 06/15/21 1432 06/16/21 0003 06/16/21 0500  ?Weight: 108.9 kg 106.7 kg 106.7 kg  ? ? ?Examination: ? ?General exam: Appears calm and comfortable  ?Respiratory system: Clear to auscultation. Respiratory effort normal. ?Cardiovascular system: S1-S2, RRR, no murmurs, no pedal edema ?Gastrointestinal system: Soft, nondistended, tender to palpation RUQ, normal bowel sounds ?Central nervous system: Alert and oriented. No focal neurological deficits. ?Extremities: Symmetric 5 x 5 power. ?Skin: No rashes, lesions or ulcers ?Psychiatry: Judgement and insight appear normal. Mood & affect appropriate.  ? ? ? ?Data Reviewed: I have personally reviewed following labs and imaging studies ? ?CBC: ?Recent Labs  ?Lab 06/15/21 ?1439 06/15/21 ?1704 06/16/21 ?06/18/21  ?WBC 2.5* 2.4* 3.0*  ?NEUTROABS  --  0.9*  --   ?  HGB 13.1 12.1 10.7*  ?HCT 45.6 41.3 36.9  ?MCV 88.2 86.8 88.1  ?PLT 220 221 202  ? ?Basic Metabolic Panel: ?Recent Labs  ?Lab 06/15/21 ?1439 06/16/21 ?[id number]  ?NA 138 141  ?K 4.1 3.8  ?CL 108 111  ?CO2 25 27  ?GLUCOSE 82 87  ?BUN 19 12  ?CREATININE 0.81  0.69  ?CALCIUM 9.4 9.1  ?MG  --  2.0  ? ?GFR: ?Estimated Creatinine Clearance: 94.7 mL/min (by C-G formula based on SCr of 0.69 mg/dL). ?Liver Function Tests: ?Recent Labs  ?Lab 06/15/21 ?1439 06/16/21 ?[id number]  ?AST 898* 415*  ?ALT 1,486* 944*  ?ALKPHOS 175* 131*  ?BILITOT 4.0* 3.5*  ?PROT 7.1 5.7*  ?ALBUMIN 4.0 3.2*  ? ?Recent Labs  ?Lab 06/15/21 ?1439  ?LIPASE 40  ? ?No results for input(s): AMMONIA in the last 168 hours. ?Coagulation Profile: ?Recent Labs  ?Lab 06/16/21 ?[id number]  ?INR 1.1  ? ?Cardiac Enzymes: ?No results for input(s): CKTOTAL, CKMB, CKMBINDEX, TROPONINI in the last 168 hours. ?BNP (last 3 results) ?No results for input(s): PROBNP in the last 8760 hours. ?HbA1C: ?No results for input(s): HGBA1C in the last 72 hours. ?CBG: ?No results for input(s): GLUCAP in the last 168 hours. ?Lipid Profile: ?No results for input(s): CHOL, HDL, LDLCALC, TRIG, CHOLHDL, LDLDIRECT in the last 72 hours. ?Thyroid Function Tests: ?No results for input(s): TSH, T4TOTAL, FREET4, T3FREE, THYROIDAB in the last 72 hours. ?Anemia Panel: ?No results for input(s): VITAMINB12, FOLATE, FERRITIN, TIBC, IRON, RETICCTPCT in the last 72 hours. ?Sepsis Labs: ?No results for input(s): PROCALCITON, LATICACIDVEN in the last 168 hours. ? ?Recent Results (from the past 240 hour(s))  ?Blood culture (routine x 2)     Status: None (Preliminary result)  ? Collection Time: 06/15/21  8:23 PM  ? Specimen: BLOOD  ?Result Value Ref Range Status  ? Specimen Description BLOOD RIGHT ASSIST CONTROL  Final  ? Special Requests   Final  ?  BOTTLES DRAWN AEROBIC AND ANAEROBIC Blood Culture adequate volume  ? Culture   Final  ?  NO GROWTH < 12 HOURS ?Performed at [facility], [address] [place], [place] [address] ?  ? Report Status PENDING  Incomplete  ?Blood culture (routine x 2)     Status: None (Preliminary result)  ? Collection Time: 06/15/21  8:23 PM  ? Specimen: BLOOD  ?Result Value Ref Range Status  ? Specimen Description BLOOD LEFT  ASSIST CONTROL  Final  ? Special Requests   Final  ?  BOTTLES DRAWN AEROBIC AND ANAEROBIC Blood Culture adequate volume  ? Culture   Final  ?  NO GROWTH < 12 HOURS ?Performed at [facility], [address] [place], [place] [address] ?  ? Report Status PENDING  Incomplete  ?Resp Panel by RT-PCR (Flu A&B, Covid) Nasopharyngeal Swab     Status: None  ? Collection Time: 06/15/21 10:07 PM  ? Specimen: Nasopharyngeal Swab; Nasopharyngeal(NP) swabs in vial transport medium  ?Result Value Ref Range Status  ? SARS Coronavirus 2 by RT PCR NEGATIVE NEGATIVE Final  ?  Comment: (NOTE) ?SARS-CoV-2 target nucleic acids are NOT DETECTED. ? ?The SARS-CoV-2 RNA is generally detectable in upper respiratory ?specimens during the acute phase of infection. The lowest ?concentration of SARS-CoV-2 viral copies this assay can detect is ?138 copies/mL. A negative result does not preclude SARS-Cov-2 ?infection and should not be used as the sole basis for treatment or ?other patient management decisions. A negative result may occur with  ?improper specimen  collection/handling, submission of specimen other ?than nasopharyngeal swab, presence of viral mutation(s) within the ?areas targeted by this assay, and inadequate number of viral ?copies(<138 copies/mL). A negative result must be combined with ?clinical observations, patient history, and epidemiological ?information. The expected result is Negative. ? ?Fact Sheet for Patients:  ?BloggerCourse.com ? ?Fact Sheet for Healthcare Providers:  ?SeriousBroker.it ? ?This test is no t yet approved or cleared by the [place] FDA and  ?has been authorized for detection and/or diagnosis of SARS-CoV-2 by ?FDA under an Emergency Use Authorization (EUA). This EUA will remain  ?in effect (meaning this test can be used) for the duration of the ?COVID-19 declaration under Section 564(b)(1) of the Act, 21 ?U.S.C.section 360bbb-3(b)(1), unless  the authorization is terminated  ?or revoked sooner.  ? ? ?  ? Influenza A by PCR NEGATIVE NEGATIVE Final  ? Influenza B by PCR NEGATIVE NEGATIVE Final  ?  Comment: (NOTE) ?The Xpert Xpress SARS-CoV-2/FLU/RS

## 2021-06-16 NOTE — Anesthesia Postprocedure Evaluation (Signed)
Anesthesia Post Note ? ?Patient: Bethanny Eick Rosengrant ? ?Procedure(s) Performed: XI ROBOTIC ASSISTED LAPAROSCOPIC CHOLECYSTECTOMY ?INTRAOPERATIVE CHOLANGIOGRAM ?INDOCYANINE GREEN FLUORESCENCE IMAGING (ICG) ? ?Patient location during evaluation: PACU ?Anesthesia Type: General ?Level of consciousness: awake and alert ?Pain management: pain level controlled ?Vital Signs Assessment: post-procedure vital signs reviewed and stable ?Respiratory status: spontaneous breathing, nonlabored ventilation and respiratory function stable ?Cardiovascular status: blood pressure returned to baseline and stable ?Postop Assessment: no apparent nausea or vomiting ?Anesthetic complications: no ? ? ?No notable events documented. ? ? ?Last Vitals:  ?Vitals:  ? 06/16/21 1753 06/16/21 2041  ?BP: 123/76 120/73  ?Pulse: 70 77  ?Resp: 16 18  ?Temp: 36.5 ?C 37 ?C  ?SpO2: 97% 97%  ?  ?Last Pain:  ?Vitals:  ? 06/16/21 2041  ?TempSrc: Oral  ?PainSc:   ? ? ?  ?  ?  ?  ?  ?  ? ?[place] Ouch ? ? ? ? ?

## 2021-06-16 NOTE — Op Note (Signed)
Preoperative diagnosis: Choledocholithiasis with cholecystitis ? ?Postoperative diagnosis: Same ? ?Procedure: Robotic Assisted Laparoscopic Cholecystectomy with intra operative cholangiogram.  ? ?Anesthesia: GETA ?  ?Surgeon: Dr. Hazle Quant ? ?Wound Classification: Clean Contaminated ? ?Indications: [name] developed epigastric pain, nausea, vomiting and on workup was found to have cholelithiasis with dilated common duct and cholecystitis. MRCP was negative for feeling defect. There was minimal improvement of bilirubin level. Discussed with GI and agree to do cholecystectomy with intra operative cholangiogram as treatment for cholecystitis and diagnostic to rule out choledocholithiasis.  ? ?Findings: ?Intra op cholangiogram positive for filling defect in CBD ?After multiple attempts, not able to visualize duodenum ?Sludge and small stones on the cystic duct.  ?Critical view of safety achieved ?Cystic duct and artery identified, ligated and divided ?Adequate hemostasis ? ? ? ? ? ? ? ? ? ? ? ? ? ? ? ? ? ? ? ? ?Description of procedure: The patient was placed on the operating table in the supine position. General anesthesia was induced. A time-out was completed verifying correct patient, procedure, site, positioning, and implant(s) and/or special equipment prior to beginning this procedure. An orogastric tube was placed. The abdomen was prepped and draped in the usual sterile fashion.  ?An incision was made in a natural skin line below the umbilicus.  ?The fascia was elevated and the Veress needle inserted. Proper position was confirmed by aspiration and saline meniscus test.  ?The abdomen was insufflated with carbon dioxide to a pressure of 15 mmHg. The patient tolerated insufflation well. A 8-mm trocar was then inserted in optiview fashion.  ?The laparoscope was inserted and the abdomen inspected. No injuries from initial trocar placement were noted. Additional trocars were then inserted in the  following locations: an 8-mm trocar in the left lateral abdomen, and another two 8-mm trocars to the right side of the abdomen 5 cm appart. The umbilical trocar was changed to a 12 mm trocar all under direct visualization. The abdomen was inspected and no abnormalities were found. The table was placed in the reverse Trendelenburg position with the right side up. The robotic arms were docked and target anatomy identified. Instrument inserted under direct visualization.  ?Filmy adhesions between the gallbladder and omentum, duodenum and transverse colon were lysed with electrocautery. The dome of the gallbladder was grasped with a prograsp and retracted over the dome of the liver. The infundibulum was also grasped with an atraumatic grasper and retracted toward the right lower quadrant. This maneuver exposed Calot?s triangle. The peritoneum overlying the gallbladder infundibulum was then incised and the cystic duct and cystic artery identified and circumferentially dissected. Critical view of safety reviewed before ligating any structure. Firefly images taken to visualize biliary ducts.  ? ?The cystic duct was clipped right at the infundibular junction. The cystotomy was done on the cystic duct.  Catheter was inserted. Bile was aspirated and saline flushed without resistance. Contrast was then flushed and fluoroscopy images were reviewed. No contrast extravasation. Unable to visualized the duodenum.  filling defect was identified. Catheter removed.   ? ?The cystic duct and cystic artery were then doubly clipped and divided close to the gallbladder.  ?The gallbladder was then dissected from its peritoneal attachments by electrocautery. Hemostasis was checked and the gallbladder and contained stones were removed using an endoscopic retrieval bag. The gallbladder was passed off the table as a specimen. The gallbladder fossa was copiously irrigated with saline and hemostasis was obtained. There was no evidence of bleeding  from the gallbladder fossa or cystic artery or leakage of the bile from the cystic duct stump. Secondary trocars were removed under direct vision. No bleeding was noted. The robotic arms were undoked. The scope was withdrawn and the umbilical trocar removed. The abdomen was allowed to collapse. The fascia of the 87mm trocar sites was closed with figure-of-eight 0 vicryl sutures. The skin was closed with subcuticular sutures of 4-0 monocryl and topical skin adhesive. The orogastric tube was removed.  ?The patient tolerated the procedure well and was taken to the postanesthesia care unit in stable condition.  ? ?Specimen: Gallbladder ? ?Complications: None ? ?EBL: 25 mL ? ?

## 2021-06-17 ENCOUNTER — Encounter
Admission: EM | Disposition: A | Discharge status: Home / Self Care | Payer: Self-pay | Source: Home / Self Care | Attending: Internal Medicine

## 2021-06-17 ENCOUNTER — Inpatient Hospital Stay: Payer: Medicare Other

## 2021-06-17 ENCOUNTER — Inpatient Hospital Stay: Payer: Medicare Other | Admitting: Anesthesiology

## 2021-06-17 ENCOUNTER — Ambulatory Visit: Payer: Medicare Other | Admitting: Physical Therapy

## 2021-06-17 ENCOUNTER — Telehealth: Payer: Self-pay | Admitting: Physical Therapy

## 2021-06-17 ENCOUNTER — Encounter: Payer: Self-pay | Admitting: General Surgery

## 2021-06-17 DIAGNOSIS — F32A Depression, unspecified: Secondary | ICD-10-CM

## 2021-06-17 DIAGNOSIS — I159 Secondary hypertension, unspecified: Secondary | ICD-10-CM

## 2021-06-17 DIAGNOSIS — E669 Obesity, unspecified: Secondary | ICD-10-CM

## 2021-06-17 DIAGNOSIS — K8050 Calculus of bile duct without cholangitis or cholecystitis without obstruction: Secondary | ICD-10-CM

## 2021-06-17 DIAGNOSIS — K810 Acute cholecystitis: Secondary | ICD-10-CM

## 2021-06-17 HISTORY — PX: ENDOSCOPIC RETROGRADE CHOLANGIOPANCREATOGRAPHY (ERCP) WITH PROPOFOL: SHX5810

## 2021-06-17 LAB — COMPREHENSIVE METABOLIC PANEL
ALT: 853 U/L — ABNORMAL HIGH (ref 0–44)
AST: 384 U/L — ABNORMAL HIGH (ref 15–41)
Albumin: 3 g/dL — ABNORMAL LOW (ref 3.5–5.0)
Alkaline Phosphatase: 137 U/L — ABNORMAL HIGH (ref 38–126)
Anion gap: 5 (ref 5–15)
BUN: 12 mg/dL (ref 6–20)
CO2: 24 mmol/L (ref 22–32)
Calcium: 9.2 mg/dL (ref 8.9–10.3)
Chloride: 111 mmol/L (ref 98–111)
Creatinine, Ser: 0.84 mg/dL (ref 0.44–1.00)
GFR, Estimated: >60 mL/min (ref >60)
Glucose, Bld: 124 mg/dL — ABNORMAL HIGH (ref 70–99)
Potassium: 4.3 mmol/L (ref 3.5–5.1)
Sodium: 140 mmol/L (ref 135–145)
Total Bilirubin: 3.5 mg/dL — ABNORMAL HIGH (ref 0.3–1.2)
Total Protein: 5.8 g/dL — ABNORMAL LOW (ref 6.5–8.1)

## 2021-06-17 LAB — CBC
HCT: 37.8 % (ref 36.0–46.0)
Hemoglobin: 11.3 g/dL — ABNORMAL LOW (ref 12.0–15.0)
MCH: 25.6 pg — ABNORMAL LOW (ref 26.0–34.0)
MCHC: 29.9 g/dL — ABNORMAL LOW (ref 30.0–36.0)
MCV: 85.7 fL (ref 80.0–100.0)
Platelets: 229 10*3/uL (ref 150–400)
RBC: 4.41 MIL/uL (ref 3.87–5.11)
RDW: 15.5 % (ref 11.5–15.5)
WBC: 6.5 10*3/uL (ref 4.0–10.5)
nRBC: 0 % (ref 0.0–0.2)

## 2021-06-17 LAB — EPSTEIN-BARR VIRUS (EBV) ANTIBODY PROFILE
EBV NA IgG: >600 U/mL — ABNORMAL HIGH (ref 0.0–17.9)
EBV VCA IgG: >600 U/mL — ABNORMAL HIGH (ref 0.0–17.9)
EBV VCA IgM: <36 U/mL (ref 0.0–35.9)

## 2021-06-17 LAB — CMV ANTIBODY, IGG (EIA): CMV Ab - IgG: 7.8 U/mL — ABNORMAL HIGH (ref 0.00–0.59)

## 2021-06-17 LAB — CMV IGM: CMV IgM: <30 AU/mL (ref 0.0–29.9)

## 2021-06-17 SURGERY — ENDOSCOPIC RETROGRADE CHOLANGIOPANCREATOGRAPHY (ERCP) WITH PROPOFOL
Anesthesia: General

## 2021-06-17 MED ORDER — IOHEXOL 300 MG/ML  SOLN
INTRAMUSCULAR | Status: DC | PRN
  Administered 2021-06-17: 10 mL

## 2021-06-17 MED ORDER — LIDOCAINE HCL (CARDIAC) PF 100 MG/5ML IV SOSY
PREFILLED_SYRINGE | INTRAVENOUS | Status: DC | PRN
  Administered 2021-06-17: 100 mg via INTRAVENOUS

## 2021-06-17 MED ORDER — DEXAMETHASONE SODIUM PHOSPHATE 10 MG/ML IJ SOLN
INTRAMUSCULAR | Status: DC | PRN
  Administered 2021-06-17: 10 mg via INTRAVENOUS

## 2021-06-17 MED ORDER — LIDOCAINE HCL (PF) 2 % IJ SOLN
INTRAMUSCULAR | Status: AC
  Filled 2021-06-17: qty 5

## 2021-06-17 MED ORDER — INDOMETHACIN 50 MG RE SUPP
RECTAL | Status: AC
  Filled 2021-06-17: qty 2

## 2021-06-17 MED ORDER — ONDANSETRON HCL 4 MG/2ML IJ SOLN
INTRAMUSCULAR | Status: AC
  Filled 2021-06-17: qty 2

## 2021-06-17 MED ORDER — SUCCINYLCHOLINE CHLORIDE 200 MG/10ML IV SOSY
PREFILLED_SYRINGE | INTRAVENOUS | Status: DC | PRN
  Administered 2021-06-17: 120 mg via INTRAVENOUS

## 2021-06-17 MED ORDER — PANTOPRAZOLE SODIUM 40 MG PO TBEC
40.0000 mg | DELAYED_RELEASE_TABLET | Freq: Every day | ORAL | Status: DC
  Administered 2021-06-17: 40 mg via ORAL
  Filled 2021-06-17: qty 1

## 2021-06-17 MED ORDER — MIDAZOLAM HCL 2 MG/2ML IJ SOLN
INTRAMUSCULAR | Status: AC
  Filled 2021-06-17: qty 2

## 2021-06-17 MED ORDER — INDOMETHACIN 50 MG RE SUPP: 100.0000 mg | Freq: Once | RECTAL | Status: DC

## 2021-06-17 MED ORDER — DEXAMETHASONE SODIUM PHOSPHATE 10 MG/ML IJ SOLN
INTRAMUSCULAR | Status: AC
  Filled 2021-06-17: qty 1

## 2021-06-17 MED ORDER — MIDAZOLAM HCL 2 MG/2ML IJ SOLN
INTRAMUSCULAR | Status: DC | PRN
  Administered 2021-06-17: 2 mg via INTRAVENOUS

## 2021-06-17 MED ORDER — ONDANSETRON HCL 4 MG/2ML IJ SOLN
INTRAMUSCULAR | Status: DC | PRN
  Administered 2021-06-17: 4 mg via INTRAVENOUS

## 2021-06-17 MED ORDER — INDOMETHACIN 50 MG RE SUPP
RECTAL | Status: DC | PRN
  Administered 2021-06-17: 100 mg via RECTAL

## 2021-06-17 MED ORDER — PROPOFOL 10 MG/ML IV BOLUS
INTRAVENOUS | Status: AC
  Filled 2021-06-17: qty 20

## 2021-06-17 MED ORDER — GLYCOPYRROLATE 0.2 MG/ML IJ SOLN
INTRAMUSCULAR | Status: DC | PRN
  Administered 2021-06-17: .2 mg via INTRAVENOUS

## 2021-06-17 MED ORDER — FENTANYL CITRATE (PF) 100 MCG/2ML IJ SOLN
INTRAMUSCULAR | Status: AC
  Filled 2021-06-17: qty 2

## 2021-06-17 MED ORDER — PROPOFOL 10 MG/ML IV BOLUS
INTRAVENOUS | Status: DC | PRN
  Administered 2021-06-17 (×2): 30 mg via INTRAVENOUS
  Administered 2021-06-17: 170 mg via INTRAVENOUS

## 2021-06-17 MED ORDER — LACTATED RINGERS IV SOLN
INTRAVENOUS | Status: DC
Start: 2021-06-17 — End: 2021-06-17

## 2021-06-17 MED ORDER — FENTANYL CITRATE (PF) 100 MCG/2ML IJ SOLN
INTRAMUSCULAR | Status: DC | PRN
Start: 2021-06-17 — End: 2021-06-17
  Administered 2021-06-17 (×2): 50 ug via INTRAVENOUS

## 2021-06-17 MED ORDER — SUCCINYLCHOLINE CHLORIDE 200 MG/10ML IV SOSY
PREFILLED_SYRINGE | INTRAVENOUS | Status: AC
  Filled 2021-06-17: qty 10

## 2021-06-17 NOTE — Telephone Encounter (Signed)
Called patient to update her schedule after she was hospitalized. Patient answered and wanted to cancel her appointments for next week.  ? ?Ariel Davis. Ilsa Iha, PT, DPT ?06/17/21, 4:39 PM ? ?

## 2021-06-17 NOTE — Progress Notes (Signed)
?  Progress Note ? ? ?Patient: Ariel Davis:[id number] DOB: [birth year] DOA: 06/15/2021     2 ?DOS: the patient was seen and examined on 06/17/2021 ? ? ?Assessment and Plan: ?* Acute cholecystitis ?Patient underwent robotic cholecystectomy yesterday on 06/16/2021 by Dr. Hazle Quant.  Currently on Rocephin and Flagyl ? ?Choledocholithiasis ?Patient had ERCP done today 06/17/2021 by Dr. Servando Snare gastroenterology.  Sphincterotomy and balloon extraction was done. ? ?Elevated LFTs ?Secondary to acute cholecystitis and choledocholithiasis.  Recheck liver function test tomorrow.  Hopefully bilirubin will start trending better. ? ?HTN (hypertension) ?On Norvasc ? ?Obesity (BMI 30-39.9) ?Last BMI 39.82 ? ?Depression ?Continue Effexor ? ?Chronic back pain ?Continue buprenorphine, Lyrica and baclofen ? ? ? ? ?  ? ?Subjective: Patient seen this morning before ERCP.  She stated she had a lot of pain yesterday after cholecystectomy.  Patient doing better today. ? ?Physical Exam: ?Vitals:  ? 06/17/21 1304 06/17/21 1311 06/17/21 1321 06/17/21 1356  ?BP: (!) 144/77 135/77 128/76 134/76  ?Pulse: 79   70  ?Resp: (!) 21   16  ?Temp:    97.8 ?F (36.6 ?C)  ?TempSrc:      ?SpO2: 100%   99%  ?Weight:      ?Height:      ? ?Physical Exam ?HENT:  ?   Head: Normocephalic.  ?   Mouth/Throat:  ?   Pharynx: No oropharyngeal exudate.  ?Eyes:  ?   General: Lids are normal.  ?   Conjunctiva/sclera: Conjunctivae normal.  ?Cardiovascular:  ?   Rate and Rhythm: Normal rate and regular rhythm.  ?   Heart sounds: Normal heart sounds, S1 normal and S2 normal.  ?Pulmonary:  ?   Breath sounds: No decreased breath sounds, wheezing, rhonchi or rales.  ?Abdominal:  ?   Palpations: Abdomen is soft.  ?   Tenderness: There is generalized abdominal tenderness.  ?Musculoskeletal:  ?   Right lower leg: No swelling.  ?   Left lower leg: No swelling.  ?Neurological:  ?   Mental Status: She is alert and oriented to person, place, and time.  ?  ?Data Reviewed: ?Laboratory  and radiological data reviewed during the hospital course.  AST came down to 384 ALT came down to 853 total bilirubin still elevated at 3.5 ? ?Family Communication: Updated patient's son on the phone ? ?Disposition: ?Status is: Inpatient ?Remains inpatient appropriate because: Patient had robotic cholecystectomy yesterday and ERCP today.  Need to watch laboratory data tomorrow morning. ?Planned Discharge Destination: Home ? ?Case discussed with general surgery and gastroenterology. ? ?Author: ?Alford Highland, MD ?06/17/2021 2:00 PM ? ?For on call review [email or web address].  ?

## 2021-06-17 NOTE — Assessment & Plan Note (Deleted)
40.17 ?

## 2021-06-17 NOTE — Op Note (Addendum)
[facility] ?Gastroenterology ?Patient Name: Ariel Davis ?Procedure Date: 06/17/2021 11:59 AM ?MRN: [id number] ?Account #: [id number] ?Date of Birth: [birth year] ?Admit Type: Outpatient ?Age: 56 ?Room: [facility] ENDO ROOM 4 ?Gender: Female ?Note Status: Finalized ?Instrument Name: [id number] ?Procedure:             ERCP ?Indications:           Common bile duct stone(s) ?Providers:             Midge Minium MD, MD ?Medicines:             General Anesthesia ?Complications:         No immediate complications. ?Procedure:             Pre-Anesthesia Assessment: ?                       - Prior to the procedure, a History and Physical was  ?                       performed, and patient medications and allergies were  ?                       reviewed. The patient's tolerance of previous  ?                       anesthesia was also reviewed. The risks and benefits  ?                       of the procedure and the sedation options and risks  ?                       were discussed with the patient. All questions were  ?                       answered, and informed consent was obtained. Prior  ?                       Anticoagulants: The patient has taken no previous  ?                       anticoagulant or antiplatelet agents. ASA Grade  ?                       Assessment: II - A patient with mild systemic disease.  ?                       After reviewing the risks and benefits, the patient  ?                       was deemed in satisfactory condition to undergo the  ?                       procedure. ?                       After obtaining informed consent, the scope was passed  ?                       under direct vision. Throughout the procedure, the  ?  patient's blood pressure, pulse, and oxygen  ?                       saturations were monitored continuously. The  ?                       Duodenoscope was introduced through the mouth, and  ?                       used to inject  contrast into and used to cannulate the  ?                       bile duct. ?Findings: ?     The scout film was normal. The esophagus was successfully intubated  ?     under direct vision. The scope was advanced to a normal major papilla in  ?     the descending duodenum without detailed examination of the pharynx,  ?     larynx and associated structures, and upper GI tract. The upper GI tract  ?     was grossly normal. The bile duct was deeply cannulated with the  ?     short-nosed traction sphincterotome. Contrast was injected. I personally  ?     interpreted the bile duct images. There was brisk flow of contrast  ?     through the ducts. Image quality was excellent. Contrast extended to the  ?     entire biliary tree. A wire was passed into the biliary tree. Biliary  ?     sphincterotomy was made with a traction (standard) sphincterotome using  ?     ERBE electrocautery. There was no post-sphincterotomy bleeding. The  ?     biliary tree was swept with a 15 mm balloon starting at the bifurcation.  ?     Two stones were removed. No stones remained. ?Impression:            - Choledocholithiasis was found. Complete removal was  ?                       accomplished by biliary sphincterotomy and balloon  ?                       extraction. ?                       - A biliary sphincterotomy was performed. ?                       - The biliary tree was swept. ?Recommendation:        - Return patient to hospital ward for ongoing care. ?                       - Clear liquid diet. ?                       - Watch for pancreatitis, bleeding, perforation, and  ?                       cholangitis. ?Procedure Code(s):     --- Professional --- ?  2956243264, Endoscopic retrograde cholangiopancreatography  ?                       (ERCP); with removal of calculi/debris from  ?                       biliary/pancreatic duct(s) ?                       [id number], Endoscopic retrograde cholangiopancreatography  ?                        (ERCP); with sphincterotomy/papillotomy ?                       [id number], Endoscopic catheterization of the biliary  ?                       ductal system, radiological supervision and  ?                       interpretation ?Diagnosis Code(s):     --- Professional --- ?                       K80.50, Calculus of bile duct without cholangitis or  ?                       cholecystitis without obstruction ?CPT copyright 2019 American Medical Association. All rights reserved. ?The codes documented in this report are preliminary and upon coder review may  ?be revised to meet current compliance requirements. ?Midge Miniumarren Haylo Fake MD, MD ?06/17/2021 12:50:12 PM ?This report has been signed electronically. ?Number of Addenda: 0 ?Note Initiated On: 06/17/2021 11:59 AM ?Estimated Blood Loss:  Estimated blood loss: none. ?     [facility] ?

## 2021-06-17 NOTE — Progress Notes (Signed)
Patient transported to Endo for her ERCP ?

## 2021-06-17 NOTE — Assessment & Plan Note (Addendum)
Patient underwent robotic cholecystectomy yesterday on 06/16/2021 by Dr. Hazle Quant.  The patient was on Rocephin and Flagyl here in the hospital and antibiotics discontinued upon disposition ?

## 2021-06-17 NOTE — Assessment & Plan Note (Signed)
Continue Effexor.

## 2021-06-17 NOTE — Assessment & Plan Note (Signed)
Patient had ERCP done today 06/17/2021 by Dr. Allen Norris gastroenterology.  Sphincterotomy and balloon extraction was done. ?

## 2021-06-17 NOTE — Transfer of Care (Signed)
Immediate Anesthesia Transfer of Care Note ? ?Patient: Ariel Davis ? ?Procedure(s) Performed: ENDOSCOPIC RETROGRADE CHOLANGIOPANCREATOGRAPHY (ERCP) WITH PROPOFOL ? ?Patient Location: PACU ? ?Anesthesia Type:General ? ?Level of Consciousness: awake and drowsy ? ?Airway & Oxygen Therapy: Patient Spontanous Breathing and Patient connected to nasal cannula oxygen ? ?Post-op Assessment: Report given to RN and Post -op Vital signs reviewed and stable ? ?Post vital signs: Reviewed and stable ? ?Last Vitals:  ?Vitals Value Taken Time  ?BP 144/77 06/17/21 1304  ?Temp 36.7 ?C 06/17/21 1301  ?Pulse 79 06/17/21 1304  ?Resp 21 06/17/21 1304  ?SpO2 100 % 06/17/21 1304  ? ? ?Last Pain:  ?Vitals:  ? 06/17/21 1301  ?TempSrc: Temporal  ?PainSc:   ?   ? ?Patients Stated Pain Goal: 0 (06/16/21 1800) ? ?Complications: No notable events documented. ?

## 2021-06-17 NOTE — Anesthesia Postprocedure Evaluation (Signed)
Anesthesia Post Note ? ?Patient: Ariel Davis ? ?Procedure(s) Performed: ENDOSCOPIC RETROGRADE CHOLANGIOPANCREATOGRAPHY (ERCP) WITH PROPOFOL ? ?Patient location during evaluation: Endoscopy ?Anesthesia Type: General ?Level of consciousness: awake and alert ?Pain management: pain level controlled ?Vital Signs Assessment: post-procedure vital signs reviewed and stable ?Respiratory status: spontaneous breathing, nonlabored ventilation, respiratory function stable and patient connected to nasal cannula oxygen ?Cardiovascular status: blood pressure returned to baseline and stable ?Postop Assessment: no apparent nausea or vomiting ?Anesthetic complications: no ? ? ?No notable events documented. ? ? ?Last Vitals:  ?Vitals:  ? 06/17/21 1311 06/17/21 1321  ?BP: 135/77 128/76  ?Pulse:    ?Resp:    ?Temp:    ?SpO2:    ?  ?Last Pain:  ?Vitals:  ? 06/17/21 1331  ?TempSrc:   ?PainSc: 0-No pain  ? ? ?  ?  ?  ?  ?  ?  ? ?Corinda Gubler ? ? ? ? ?

## 2021-06-17 NOTE — Progress Notes (Signed)
Patient ID: Ariel Davis, female   DOB: Aug 28, 1965, 56 y.o.   MRN: [id number] ?    SURGICAL PROGRESS NOTE  ? ?Hospital Day(s): 2.  ? ?Interval History: Patient seen and examined, no acute events or new complaints overnight. Patient reports feeling okay this morning.  She was found sitting down in the bed.  She is in good spirit.  Endorses expected incisional pain but pain controlled.  Denies any nausea or vomiting. ? ?Vital signs in last 24 hours: [min-max] current  ?Temp:  [97.7 ?F (36.5 ?C)-99.6 ?F (37.6 ?C)] 98.6 ?F (37 ?C) (03/15 0525) ?Pulse Rate:  [50-87] 50 (03/15 0525) ?Resp:  [14-27] 18 (03/15 0525) ?BP: (107-141)/(58-81) 122/70 (03/15 0525) ?SpO2:  [95 %-100 %] 96 % (03/15 0525) ?Weight:  [111.9 kg] 111.9 kg (03/15 0420)     Height: 5\' 6"  (167.6 cm) Weight: 111.9 kg BMI (Calculated): 39.84  ? ?Physical Exam:  ?Constitutional: alert, cooperative and no distress  ?Respiratory: breathing non-labored at rest  ?Cardiovascular: regular rate and sinus rhythm  ?Gastrointestinal: soft, non-tender, and non-distended.  Incisions are dry and clean ? ?Labs:  ?CBC Latest Ref Rng & Units 06/17/2021 06/16/2021 06/15/2021  ?WBC 4.0 - 10.5 K/uL 6.5 3.0(L) 2.4(L)  ?Hemoglobin 12.0 - 15.0 g/dL 11.3(L) 10.7(L) 12.1  ?Hematocrit 36.0 - 46.0 % 37.8 36.9 41.3  ?Platelets 150 - 400 K/uL 229 202 221  ? ?CMP Latest Ref Rng & Units 06/17/2021 06/16/2021 06/15/2021  ?Glucose 70 - 99 mg/dL 124(H) 87 82  ?BUN 6 - 20 mg/dL 12 12 19   ?Creatinine 0.44 - 1.00 mg/dL 0.84 0.69 0.81  ?Sodium 135 - 145 mmol/L 140 141 138  ?Potassium 3.5 - 5.1 mmol/L 4.3 3.8 4.1  ?Chloride 98 - 111 mmol/L 111 111 108  ?CO2 22 - 32 mmol/L 24 27 25   ?Calcium 8.9 - 10.3 mg/dL 9.2 9.1 9.4  ?Total Protein 6.5 - 8.1 g/dL 5.8(L) 5.7(L) 7.1  ?Total Bilirubin 0.3 - 1.2 mg/dL 3.5(H) 3.5(H) 4.0(H)  ?Alkaline Phos 38 - 126 U/L 137(H) 131(H) 175(H)  ?AST 15 - 41 U/L 384(H) 415(H) 898(H)  ?ALT 0 - 44 U/L 853(H) 944(H) 1,486(H)  ? ? ?Imaging studies: No new pertinent imaging  studies ? ? ?Assessment/Plan:  ?56 y.o. female with cholecystitis and choledocholithiasis 1 Day Post-Op s/p robotic assisted laparoscopic cholecystectomy with intraoperative cholangiogram. ? ? -Patient recovering adequately ? -Case discussed with GI.  Patient will had ERCP ? -We will continue with pain management ? -May restart diet after ERCP ?            -Encouraged to ambulate ?            -Patient may shower ? ?Gwendolyn Grant, MD ? ? ? ?

## 2021-06-17 NOTE — Progress Notes (Signed)
Mobility Specialist - Progress Note ? ? 06/17/21 1200  ?Mobility  ?Activity Off unit  ? ? ?Pt off unit at time of attempt. Will attempt at another date and time. ? ?Clarisa Schools ?Mobility Specialist ?06/17/21, 12:07 PM ? ? ? ? ?

## 2021-06-17 NOTE — Assessment & Plan Note (Addendum)
Continue buprenorphine, Lyrica and baclofen ?

## 2021-06-17 NOTE — Anesthesia Procedure Notes (Signed)
Procedure Name: Intubation ?Date/Time: 06/17/2021 12:20 PM ?Performed by: Ginger Carne, CRNA ?Pre-anesthesia Checklist: Patient identified, Emergency Drugs available, Suction available, Patient being monitored and Timeout performed ?Patient Re-evaluated:Patient Re-evaluated prior to induction ?Oxygen Delivery Method: Circle system utilized ?Preoxygenation: Pre-oxygenation with 100% oxygen ?Induction Type: IV induction and Rapid sequence ?Laryngoscope Size: McGraph and 3 ?Grade View: Grade I ?Tube type: Oral ?Tube size: 7.0 mm ?Number of attempts: 1 ?Airway Equipment and Method: Stylet and Video-laryngoscopy ?Placement Confirmation: ETT inserted through vocal cords under direct vision, positive ETCO2 and breath sounds checked- equal and bilateral ?Secured at: 21 cm ?Tube secured with: Tape ?Dental Injury: Teeth and Oropharynx as per pre-operative assessment  ? ? ? ? ?

## 2021-06-17 NOTE — Assessment & Plan Note (Addendum)
Secondary to acute cholecystitis and choledocholithiasis.  Recheck liver function test tomorrow.  On admission AST was 898 and ALT was 1486.  Alkaline phosphatase was 175 and total bilirubin was 4.0.  Upon discharge AST down to 198 and ALT 732, total bilirubin down to 1.3 and alkaline phosphatase normal range at 119 ?

## 2021-06-17 NOTE — Anesthesia Preprocedure Evaluation (Signed)
Anesthesia Evaluation  ?Patient identified by MRN, date of birth, ID band ?Patient awake ? ? ? ?Reviewed: ?Allergy & Precautions, NPO status , Patient's Chart, lab work & pertinent test results ? ?History of Anesthesia Complications ?Negative for: history of anesthetic complications ? ?Airway ?Mallampati: III ? ?TM Distance: >3 FB ?Neck ROM: full ? ? ? Dental ?no notable dental hx. ?(+) Teeth Intact ?  ?Pulmonary ?sleep apnea , neg COPD, Patient abstained from smoking.Not current smoker,  ?  ?Pulmonary exam normal ?breath sounds clear to auscultation ? ? ? ? ? ? Cardiovascular ?Exercise Tolerance: Good ?METShypertension, Pt. on medications ?(-) CAD and (-) Past MI Normal cardiovascular exam(-) dysrhythmias  ?Rhythm:Regular Rate:Normal ?- Systolic murmurs ? ?  ?Neuro/Psych ?PSYCHIATRIC DISORDERS Anxiety Depression  Neuromuscular disease (chronic back pain)   ? GI/Hepatic ?hiatal hernia (Small from EGD 2020), GERD  Controlled and Medicated,(+)  ?  ? substance abuse ? , Elevated LFTs ? ?Chronic pain on buprenorphine ?Cholecystitis ? ?EGD 10/20- [facility]- preop for bariatric surgery- small hiatal hernia, gastritis.  ?  ?Endo/Other  ?negative endocrine ROSneg diabetes ? Renal/GU ?negative Renal ROS  ? ?  ?Musculoskeletal ? ?(+) Arthritis , narcotic dependent ? Abdominal ?(+) + obese,   ?Peds ? Hematology ? ?(+) Blood dyscrasia, anemia , Hgb 10.7   ?Anesthesia Other Findings ?Past Medical History: ?No date: Anxiety ?No date: Chronic back pain ?No date: H/O degenerative disc disease ?No date: Hypertension ?No date: Sciatica ?Chronic opioid use- buprenorphine ? ?Past Surgical History: ?No date: breast bioosy ?No date: HYSTEROSCOPY ?04/09/2021: TOTAL KNEE ARTHROPLASTY; Right ? ?BMI   ? Body Mass Index: 37.97 kg/m?  ?  ? ? Reproductive/Obstetrics ?negative OB ROS ? ?  ? ? ? ? ? ? ? ? ? ? ? ? ? ?  ?  ? ? ? ? ? ? ? ? ?Anesthesia Physical ? ?Anesthesia Plan ? ?ASA: 3 ? ?Anesthesia Plan: General ETT   ? ?Post-op Pain Management: Toradol IV (intra-op)* and Regional block*  ? ?Induction: Intravenous ? ?PONV Risk Score and Plan: Ondansetron, Dexamethasone, Midazolam and Scopolamine patch - Pre-op ? ?Airway Management Planned: Oral ETT ? ?Additional Equipment: None ? ?Intra-op Plan:  ? ?Post-operative Plan: Extubation in OR ? ?Informed Consent: I have reviewed the patients History and Physical, chart, labs and discussed the procedure including the risks, benefits and alternatives for the proposed anesthesia with the patient or authorized representative who has indicated his/her understanding and acceptance.  ? ? ? ?Dental advisory given ? ?Plan Discussed with: Anesthesiologist, CRNA and Surgeon ? ?Anesthesia Plan Comments: (Discussed risks of anesthesia with patient, including PONV, sore throat, lip/dental/eye damage. Rare risks discussed as well, such as cardiorespiratory and neurological sequelae, and allergic reactions. Discussed the role of CRNA in patient's perioperative care. Patient understands.)  ? ? ? ? ? ? ?Anesthesia Quick Evaluation ? ?

## 2021-06-17 NOTE — Assessment & Plan Note (Signed)
On Norvasc 

## 2021-06-18 DIAGNOSIS — I10 Essential (primary) hypertension: Secondary | ICD-10-CM

## 2021-06-18 DIAGNOSIS — E66813 Obesity, class 3: Secondary | ICD-10-CM

## 2021-06-18 LAB — CBC
HCT: 37.1 % (ref 36.0–46.0)
Hemoglobin: 10.8 g/dL — ABNORMAL LOW (ref 12.0–15.0)
MCH: 25.1 pg — ABNORMAL LOW (ref 26.0–34.0)
MCHC: 29.1 g/dL — ABNORMAL LOW (ref 30.0–36.0)
MCV: 86.1 fL (ref 80.0–100.0)
Platelets: 255 10*3/uL (ref 150–400)
RBC: 4.31 MIL/uL (ref 3.87–5.11)
RDW: 15.7 % — ABNORMAL HIGH (ref 11.5–15.5)
WBC: 9.1 10*3/uL (ref 4.0–10.5)
nRBC: 0 % (ref 0.0–0.2)

## 2021-06-18 LAB — COMPREHENSIVE METABOLIC PANEL
ALT: 732 U/L — ABNORMAL HIGH (ref 0–44)
AST: 198 U/L — ABNORMAL HIGH (ref 15–41)
Albumin: 3.1 g/dL — ABNORMAL LOW (ref 3.5–5.0)
Alkaline Phosphatase: 119 U/L (ref 38–126)
Anion gap: 7 (ref 5–15)
BUN: 13 mg/dL (ref 6–20)
CO2: 26 mmol/L (ref 22–32)
Calcium: 9.3 mg/dL (ref 8.9–10.3)
Chloride: 108 mmol/L (ref 98–111)
Creatinine, Ser: 0.78 mg/dL (ref 0.44–1.00)
GFR, Estimated: >60 mL/min (ref >60)
Glucose, Bld: 115 mg/dL — ABNORMAL HIGH (ref 70–99)
Potassium: 4.3 mmol/L (ref 3.5–5.1)
Sodium: 141 mmol/L (ref 135–145)
Total Bilirubin: 1.3 mg/dL — ABNORMAL HIGH (ref 0.3–1.2)
Total Protein: 6.3 g/dL — ABNORMAL LOW (ref 6.5–8.1)

## 2021-06-18 LAB — SURGICAL PATHOLOGY

## 2021-06-18 LAB — LIPASE, BLOOD: Lipase: 64 U/L — ABNORMAL HIGH (ref 11–51)

## 2021-06-18 MED ORDER — HYDROCODONE-ACETAMINOPHEN 5-325 MG PO TABS: 1.0000 | ORAL_TABLET | ORAL | 0 refills | Status: AC | PRN

## 2021-06-18 NOTE — Assessment & Plan Note (Signed)
BMI upon discharge 40.17 ?

## 2021-06-18 NOTE — Progress Notes (Signed)
Patient ID: Ariel Davis, female   DOB: 1965-11-12, 56 y.o.   MRN: [id number] ?    SURGICAL PROGRESS NOTE  ? ?Hospital Day(s): 3.  ? ?Interval History: Patient seen and examined, no acute events or new complaints overnight. Patient reports feeling well.  She endorses having mild soreness on the incisions.  Pain controlled by current pain medication.  Patient tolerated diet.  Patient ambulated. ? ?Patient had ERCP yesterday with complete removal of choledocholithiasis.  No sign of complication from ERCP. ? ?Vital signs in last 24 hours: [min-max] current  ?Temp:  [97.8 ?F (36.6 ?C)-99 ?F (37.2 ?C)] 98.5 ?F (36.9 ?C) (03/16 0753) ?Pulse Rate:  [53-79] 53 (03/16 0753) ?Resp:  [16-21] 18 (03/16 0753) ?BP: (98-144)/(42-77) 125/63 (03/16 0753) ?SpO2:  [96 %-100 %] 97 % (03/16 0753) ?Weight:  [112.9 kg] 112.9 kg (03/16 0444)     Height: 5\' 6"  (167.6 cm) Weight: 112.9 kg BMI (Calculated): 40.19  ? ?Physical Exam:  ?Constitutional: alert, cooperative and no distress  ?Respiratory: breathing non-labored at rest  ?Cardiovascular: regular rate and sinus rhythm  ?Gastrointestinal: soft, non-tender, and non-distended.  Incisions are dry and clean ? ?Labs:  ?CBC Latest Ref Rng & Units 06/18/2021 06/17/2021 06/16/2021  ?WBC 4.0 - 10.5 K/uL 9.1 6.5 3.0(L)  ?Hemoglobin 12.0 - 15.0 g/dL 10.8(L) 11.3(L) 10.7(L)  ?Hematocrit 36.0 - 46.0 % 37.1 37.8 36.9  ?Platelets 150 - 400 K/uL 255 229 202  ? ?CMP Latest Ref Rng & Units 06/18/2021 06/17/2021 06/16/2021  ?Glucose 70 - 99 mg/dL 115(H) 124(H) 87  ?BUN 6 - 20 mg/dL 13 12 12   ?Creatinine 0.44 - 1.00 mg/dL 0.78 0.84 0.69  ?Sodium 135 - 145 mmol/L 141 140 141  ?Potassium 3.5 - 5.1 mmol/L 4.3 4.3 3.8  ?Chloride 98 - 111 mmol/L 108 111 111  ?CO2 22 - 32 mmol/L 26 24 27   ?Calcium 8.9 - 10.3 mg/dL 9.3 9.2 9.1  ?Total Protein 6.5 - 8.1 g/dL 6.3(L) 5.8(L) 5.7(L)  ?Total Bilirubin 0.3 - 1.2 mg/dL 1.3(H) 3.5(H) 3.5(H)  ?Alkaline Phos 38 - 126 U/L 119 137(H) 131(H)  ?AST 15 - 41 U/L 198(H) 384(H) 415(H)   ?ALT 0 - 44 U/L 732(H) 853(H) 944(H)  ? ? ?Imaging studies: No new pertinent imaging studies ? ? ?Assessment/Plan:  ?56 y.o. female with cholecystitis and choledocholithiasis 2 Day Post-Op s/p robotic assisted laparoscopic cholecystectomy with intraoperative cholangiogram. ?  ?            -Patient recovering adequately ?            -ERCP done yesterday.  Complete removal of common bile duct stones.  Bilirubin decreased to 1.3 ?            -Pain control ?            -Patient tolerating diet ?            -Patient ambulating well ?            -Patient may be discharged from surgical standpoint.  I will follow patient in 2 weeks in my office. ? ?Gwendolyn Grant, MD ? ? ? ?

## 2021-06-18 NOTE — Discharge Instructions (Signed)

## 2021-06-18 NOTE — TOC CM/SW Note (Signed)
Patient has orders to discharge home today. Chart reviewed. PCP is Galen Daft, NP. On room air. Has incision on abdomen (dermabond). Patient is active with [facility] for outpatient PT. No TOC needs identified. CSW signing off. ? ?Dayton Scrape, [place] ?[phone] ? ?

## 2021-06-18 NOTE — Progress Notes (Signed)
Discharge instructions reviewed with patient. She states she has no further questions or concerns at this time. PIVs removed with no complications and home mediation was returned. Patient states she will be discharging back home and daughter will be picking her up ?

## 2021-06-18 NOTE — Discharge Summary (Signed)
?Physician Discharge Summary ?  ?Patient: Ariel Davis MRN: [id number] DOB: June 09, 1965  ?Admit date:     06/15/2021  ?Discharge date: 06/18/21  ?Discharge Physician: Alford Highland  ? ?PCP: Herschell Dimes, NP  ? ?Recommendations at discharge:  ? ?Follow-up PCP 5 days ?Follow-up Dr. Hazle Quant 2 weeks ? ?Discharge Diagnoses: ?Principal Problem: ?  Acute cholecystitis ?Active Problems: ?  Choledocholithiasis ?  Elevated LFTs ?  HTN (hypertension) ?  Depression ?  Chronic back pain ?  Obesity, Class III, BMI 40-49.9 (morbid obesity) (HCC) ? ? ?Hospital Course: ?The patient was admitted to the hospital on 06/15/2021 for abdominal pain nausea vomiting.  She was diagnosed with acute cholecystitis and choledocholithiasis.  She had a robotic cholecystectomy done by Dr. Hazle Quant on 06/16/2021.  The patient had an ERCP on 06/17/2021 which complete removal of choledocholithiasis was done by biliary sphincterotomy and balloon extraction.  The patient was feeling well and stable for discharge home on 06/18/2021. ? ?Assessment and Plan: ?* Acute cholecystitis ?Patient underwent robotic cholecystectomy yesterday on 06/16/2021 by Dr. Hazle Quant.  The patient was on Rocephin and Flagyl here in the hospital and antibiotics discontinued upon disposition ? ?Choledocholithiasis ?Patient had ERCP done today 06/17/2021 by Dr. Servando Snare gastroenterology.  Sphincterotomy and balloon extraction was done. ? ?Elevated LFTs ?Secondary to acute cholecystitis and choledocholithiasis.  Recheck liver function test tomorrow.  On admission AST was 898 and ALT was 1486.  Alkaline phosphatase was 175 and total bilirubin was 4.0.  Upon discharge AST down to 198 and ALT 732, total bilirubin down to 1.3 and alkaline phosphatase normal range at 119 ? ?HTN (hypertension) ?On Norvasc ? ?Depression ?Continue Effexor ? ?Obesity, Class III, BMI 40-49.9 (morbid obesity) (HCC) ?BMI upon discharge 40.17 ? ?Chronic back pain ?Continue buprenorphine, Lyrica and  baclofen ? ? ? ? ?  ? ? ?Consultants: General surgery and gastroenterology ?Procedures performed: Robotic cholecystectomy and ERCP ?Disposition: Home ?Diet recommendation:  ?Cardiac diet ?DISCHARGE MEDICATION: ?Allergies as of 06/18/2021   ? ?   Reactions  ? Amoxicillin Hives  ? Mild hives only on arms. No severe reactions.  ? Duloxetine Rash  ? Hydrocodone Itching  ? Oxycodone Itching  ? ?  ? ?  ?Medication List  ?  ? ?STOP taking these medications   ? ?celecoxib 200 MG capsule ?Commonly known as: CELEBREX ?  ?fluticasone 50 MCG/ACT nasal spray ?Commonly known as: FLONASE ?  ?gabapentin 300 MG capsule ?Commonly known as: NEURONTIN ?  ?lisinopril-hydrochlorothiazide 20-12.5 MG tablet ?Commonly known as: ZESTORETIC ?  ?meloxicam 15 MG tablet ?Commonly known as: MOBIC ?  ?ondansetron 4 MG disintegrating tablet ?Commonly known as: Zofran ODT ?  ?promethazine-dextromethorphan 6.25-15 MG/5ML syrup ?Commonly known as: PROMETHAZINE-DM ?  ? ?  ? ?TAKE these medications   ? ?amLODipine 5 MG tablet ?Commonly known as: NORVASC ?Take 5 mg by mouth daily. ?What changed: Another medication with the same name was removed. Continue taking this medication, and follow the directions you see here. ?  ?baclofen 10 MG tablet ?Commonly known as: LIORESAL ?Take 10 mg by mouth 2 (two) times daily. ?  ?Buprenorphine HCl 300 MCG Film ?Place 1 strip under the tongue 2 (two) times daily. ?  ?diclofenac sodium 1 % Gel ?Commonly known as: VOLTAREN ?APPLY 2 TO 4 GRAMS TOPICALLY 4 TIMES DAILY ?  ?HYDROcodone-acetaminophen 5-325 MG tablet ?Commonly known as: Norco ?Take 1 tablet by mouth every 4 (four) hours as needed for up to 3 days for moderate pain. ?  ?hydrOXYzine 25  MG tablet ?Commonly known as: ATARAX ?Can take 1-2 tablets 3 times a day as needed for anxiety ?  ?omeprazole 20 MG capsule ?Commonly known as: PRILOSEC ?Take 20 mg by mouth daily. ?  ?pregabalin 50 MG capsule ?Commonly known as: LYRICA ?Take 50 mg by mouth 3 (three) times daily. ?   ?rOPINIRole 0.5 MG tablet ?Commonly known as: REQUIP ?Take 1 tablet (0.5 mg total) by mouth at bedtime. ?  ?topiramate 50 MG tablet ?Commonly known as: TOPAMAX ?Take 50 mg by mouth at bedtime. ?  ?venlafaxine XR 37.5 MG 24 hr capsule ?Commonly known as: Effexor XR ?Take 1 capsule (37.5 mg total) by mouth daily. ?  ?Victoza 18 MG/3ML Sopn ?Generic drug: liraglutide ?1.2 mg daily. ?  ?Vitamin D (Ergocalciferol) 1.25 MG (50000 UNIT) Caps capsule ?Commonly known as: DRISDOL ?Take 50,000 Units by mouth once a week. ?  ? ?  ? ? Follow-up Information   ? ? Herschell Dimes, NP. Go on 06/24/2021.   ?Specialty: Emergency Medicine ?Why: 10:40am appointment ?Contact information: ?[address] ?[facility] ?[place] [address] ?[phone] ? ? ?  ?  ? ? Carolan Shiver, MD. Go on 06/30/2021.   ?Specialty: General Surgery ?Why: 8:45am appointment ?Contact information: ?[address] ?[place] [address] ?[phone] ? ? ?  ?  ? ?  ?  ? ?  ? ?Discharge Exam: ?Filed Weights  ? 06/16/21 0500 06/17/21 0420 06/18/21 0444  ?Weight: 106.7 kg 111.9 kg 112.9 kg  ? ?Physical Exam ?HENT:  ?   Head: Normocephalic.  ?   Mouth/Throat:  ?   Pharynx: No oropharyngeal exudate.  ?Eyes:  ?   General: Lids are normal.  ?   Conjunctiva/sclera: Conjunctivae normal.  ?Cardiovascular:  ?   Rate and Rhythm: Normal rate and regular rhythm.  ?   Heart sounds: Normal heart sounds, S1 normal and S2 normal.  ?Pulmonary:  ?   Breath sounds: No decreased breath sounds, wheezing, rhonchi or rales.  ?Abdominal:  ?   Palpations: Abdomen is soft.  ?   Tenderness: There is no abdominal tenderness.  ?Musculoskeletal:  ?   Right lower leg: No swelling.  ?   Left lower leg: No swelling.  ?Neurological:  ?   Mental Status: She is alert and oriented to person, place, and time.  ?  ? ?Condition at discharge: stable ? ?The results of significant diagnostics from this hospitalization (including imaging, microbiology, ancillary and laboratory) are  listed below for reference.  ? ?Imaging Studies: ?DG Cholangiogram Operative ? ?Result Date: 06/16/2021 ?CLINICAL DATA:  Laparoscopic cholecystectomy with intraoperative cholangiogram EXAM: INTRAOPERATIVE CHOLANGIOGRAM TECHNIQUE: Cholangiographic images from the C-arm fluoroscopic device were submitted for interpretation post-operatively. Please see the procedural report for the amount of contrast and the fluoroscopy time utilized. FLUOROSCOPY: 2 minutes 6 seconds COMPARISON:  None. FINDINGS: Multiple cine clips are submitted for review. The images demonstrate and intraoperative cholangiogram. The intrahepatic and common bile duct are mildly dilated. Two subtle filling defects are present in the distal common bile duct. Contrast is visualized passing through the ampulla and into the duodenum on at least 1 occasion. IMPRESSION: Intraoperative cholangiogram suspicious for nonocclusive choledocholithiasis with 2 small filling defects in the distal common bile duct. Electronically Signed   By: Malachy Moan M.D.   On: 06/16/2021 16:00  ? ?MR 3D Recon At Scanner ? ?Result Date: 06/15/2021 ?CLINICAL DATA:  Jaundice, nausea, vomiting, right upper quadrant pain EXAM: MRI ABDOMEN WITH CONTRAST (WITH MRCP) TECHNIQUE: Multiplanar  multisequence MR imaging of the abdomen was performed following the administration of intravenous contrast. Heavily T2-weighted images of the biliary and pancreatic ducts were obtained, and three-dimensional MRCP images were rendered by post processing. CONTRAST:  10mL GADAVIST GADOBUTROL 1 MMOL/ML IV SOLN COMPARISON:  Right upper quadrant ultrasound, 06/15/2021 FINDINGS: Lower chest: No acute findings. Hepatobiliary: Mild hepatic steatosis. No mass or other parenchymal abnormality identified. Tiny gallstones and sludge in the dependent gallbladder (series 4, image 23). Small volume pericholecystic fluid. Minimal intra and extrahepatic biliary ductal dilatation, common bile duct measuring up to 0.8  cm in caliber. Pancreas: No mass, inflammatory changes, or other parenchymal abnormality identified.No pancreatic ductal dilatation. Spleen:  Within normal limits in size and appearance. Adrenals/Urinary Tr

## 2021-06-19 ENCOUNTER — Encounter: Payer: Self-pay | Admitting: Gastroenterology

## 2021-06-20 LAB — CULTURE, BLOOD (ROUTINE X 2)
Culture: NO GROWTH
Culture: NO GROWTH
Special Requests: ADEQUATE
Special Requests: ADEQUATE

## 2021-06-22 ENCOUNTER — Encounter: Payer: Medicare Other | Admitting: Physical Therapy

## 2021-06-24 ENCOUNTER — Encounter: Payer: Medicare Other | Admitting: Physical Therapy

## 2021-06-29 ENCOUNTER — Ambulatory Visit: Payer: Medicare Other | Admitting: Physical Therapy

## 2021-06-29 ENCOUNTER — Encounter: Payer: Self-pay | Admitting: Physical Therapy

## 2021-06-29 ENCOUNTER — Other Ambulatory Visit: Payer: Self-pay

## 2021-06-29 VITALS — BP 119/73 | HR 84

## 2021-06-29 DIAGNOSIS — R262 Difficulty in walking, not elsewhere classified: Secondary | ICD-10-CM | POA: Diagnosis present

## 2021-06-29 DIAGNOSIS — M6281 Muscle weakness (generalized): Secondary | ICD-10-CM | POA: Diagnosis present

## 2021-06-29 DIAGNOSIS — G8929 Other chronic pain: Secondary | ICD-10-CM

## 2021-06-29 DIAGNOSIS — M5442 Lumbago with sciatica, left side: Secondary | ICD-10-CM | POA: Diagnosis present

## 2021-06-29 DIAGNOSIS — M25552 Pain in left hip: Secondary | ICD-10-CM

## 2021-06-29 DIAGNOSIS — M25661 Stiffness of right knee, not elsewhere classified: Secondary | ICD-10-CM

## 2021-06-29 DIAGNOSIS — M25561 Pain in right knee: Secondary | ICD-10-CM | POA: Diagnosis present

## 2021-06-29 NOTE — Therapy (Signed)
?OUTPATIENT PHYSICAL THERAPY RE-EVALUATION / TREATMENT NOTE  ?Dates of reporting 06/10/2021 to 06/29/2021 ?Reason for Re-Evaluation: change in medical status after being hospitalized on 06/15/2021 and undergoing robotic cholecystectomy done by Dr. Windell Moment on 06/16/2021 and ERCP on 06/17/2021 with complete removal of choledocholithiasis was done by biliary sphincterotomy and balloon extraction. ? ? ?Patient Name: Ariel Davis ?MRN: [id number] ?DOB:1965/07/31, 56 y.o., female ?Today's Date: 06/29/2021 ? ?PCP: Willaim Rayas, NP ?REFERRING PROVIDER: Merlene Morse, MD ? ? PT End of Session - 06/29/21 1322   ? ? Visit Number 13   ? Number of Visits 24   ? Date for PT Re-Evaluation 09/02/21   ? Authorization Type [employer] reporting period from 05/28/2021   ? Progress Note Due on Visit 10   ? PT Start Time 1303   ? PT Stop Time [id number]   ? PT Time Calculation (min) 40 min   ? Activity Tolerance Patient tolerated treatment well   ? Behavior During Therapy [facility] for tasks assessed/performed   ? ?  ?  ? ?  ? ? ?Past Medical History:  ?Diagnosis Date  ? Anxiety   ? Chronic back pain   ? H/O degenerative disc disease   ? Hypertension   ? Sciatica   ? ?Past Surgical History:  ?Procedure Laterality Date  ? breast bioosy    ? CHOLECYSTECTOMY    ? ENDOSCOPIC RETROGRADE CHOLANGIOPANCREATOGRAPHY (ERCP) WITH PROPOFOL N/A 06/17/2021  ? Procedure: ENDOSCOPIC RETROGRADE CHOLANGIOPANCREATOGRAPHY (ERCP) WITH PROPOFOL;  Surgeon: Lucilla Lame, MD;  Location: [facility] ENDOSCOPY;  Service: Endoscopy;  Laterality: N/A;  ? HYSTEROSCOPY    ? INTRAOPERATIVE CHOLANGIOGRAM N/A 06/16/2021  ? Procedure: INTRAOPERATIVE CHOLANGIOGRAM;  Surgeon: Herbert Pun, MD;  Location: [facility] ORS;  Service: General;  Laterality: N/A;  ? TOTAL KNEE ARTHROPLASTY Right 04/09/2021  ? ?Patient Active Problem List  ? Diagnosis Date Noted  ? Obesity, Class III, BMI 40-49.9 (morbid obesity) ([place]) 06/18/2021  ? Acute cholecystitis 06/17/2021  ?  Choledocholithiasis   ? Elevated LFTs 06/15/2021  ? Gallstones   ? Jaundice   ? Arthritis of right knee 01/31/2018  ? DDD (degenerative disc disease), lumbar 01/31/2018  ? Pain medication agreement signed 01/31/2018  ? Chronic back pain 01/02/2018  ? Pain in both feet 08/31/2017  ? Knee pain, bilateral 07/05/2017  ? Depression 06/07/2017  ? OSA (obstructive sleep apnea) 05/12/2017  ? Closed nondisplaced fracture of anterior wall of left acetabulum ([place]) 08/27/2016  ? Localized osteoarthritis of right knee 08/27/2016  ? Closed fracture of posterior wall of left acetabulum ([place]) 08/23/2016  ? Essential hypertension 06/21/2016  ? Allergic rhinitis 12/06/2013  ? Microcytic hypochromic anemia 12/06/2013  ? ? ?REFERRING DIAG: status post right total knee replacement, low back pain, L hip pain ? ?THERAPY DIAG:  ?Chronic pain of right knee ? ?Stiffness of right knee, not elsewhere classified ? ?Muscle weakness (generalized) ? ?Difficulty in walking, not elsewhere classified ? ?Chronic left-sided low back pain with left-sided sciatica ? ?Pain in left hip ? ?PERTINENT HISTORY: [name] who presents to outpatient physical therapy with a referral for medical diagnosis status post right total knee replacement, low back pain, L hip pain. This patient's chief complaints consist of right knee pain and stiffness following R TKA on 04/09/2021 as well as acute worsening of left low back and hip pain following TKA leading to the following functional deficits: difficulty with mobility including bed mobility, transfers, household and community ambulation, stairs; completing ADLs and IADLs such  as cooking and cleaning; makes her unhappy because she "cannot do anything."  Relevant past medical history and comorbidities include chronic back pain (does intermittently go down both legs), anxiety, sleep apnea, left hip fracture (2018 still bothers her, non surgical), bilateral foot pain, microcytic hypochromatic anemia,  bilateral knee pain, obesity.  Patient denies hx of cancer, stroke, seizures, lung problem, major cardiac events, diabetes, unexplained weight loss, changes in bowel or bladder problems, new onset stumbling or dropping things, spinal surgery, left hip surgery. she was hospitalized on 06/15/2021.  She had a robotic cholecystectomy done by Dr. Windell Moment on 06/16/2021.  The patient had an ERCP on 06/17/2021 which complete removal of choledocholithiasis was done by biliary sphincterotomy and balloon extraction. She was discharged home on 06/18/2021. She was advised on "no heavy lifting >20 pounds (children, pets, laundry, garbage) or strenuous activity until follow-up, but light activity and walking are encouraged" upon discharge ? ?PRECAUTIONS: Hospital discharge paperwork: "no heavy lifting >20 pounds (children, pets, laundry, garbage) or strenuous activity until follow-up, but light ?activity and walking are encouraged ? ?SUBJECTIVE: Patient reports she is feeling well today with no pain anywhere upon arrival. She arrives on RW. Since last PT session she was hospitalized on 06/15/2021.  She had a robotic cholecystectomy done by Dr. Windell Moment on 06/16/2021.  The patient had an ERCP on 06/17/2021 with complete removal of choledocholithiasis was done by biliary sphincterotomy and balloon extraction. She was discharged home on 06/18/2021. She was advised on "no heavy lifting >20 pounds (children, pets, laundry, garbage) or strenuous activity until follow-up, but light activity and walking are encouraged" upon discharge. She states her incision is healing well but she has not yet gone to her follow up appointment. She state she has not been doing her HEP or been up and walking much. Her left hip has not been hurting her much but she has had pain in her left low back and she can tell her right knee is not doing as well without doing her exercises. On 06/29/2021 Dr. Windell Moment stated she had "no specific restriction" and  that "basically her only limitation is the pain" when PT inquired by instant message if she was cleared to return to PT. Highest level of pain patient reports in her affected areas is maybe 8/10 in the left low back and left hip region. R knee pain up to 5/10 in the same time period. ? ?PAIN:  ?Are you having pain? No ? ? ?OBJECTIVE ? ?SELF-REPORTED FUNCTION ?FOTO score: 54/100 (knee questionnaire) ? ? ?Vitals:  ? 06/29/21 1318  ?BP: 119/73  ?Pulse: 84  ?SpO2: 98%  ?Seated, right arm, adult large automatic duff ?  ?FUNCTIONAL TESTING ?- 6 Minute Walk Test: [id number] with RW. Pain in lateral left thigh and in anterior left knee  while walking.  ? ?- 5 Time Sit To Stand Test: 12.31 seconds with B UE support from 18.5 inch plinth. Reports pain at bilateral knees.  ? ?TODAY'S TREATMENT:  ?Therapeutic exercise: to centralize symptoms and improve ROM, strength, muscular endurance, and activity tolerance required for successful completion of functional activities.  ?- seated R knee flexion with self overpressure using L LE with R foot on floor slider, 1x20 with 5 second holds.  ?- seated R LAQ with 10# AW, 3x10 ?  ?Pt required multimodal cuing for proper technique and to facilitate improved neuromuscular control, strength, range of motion, and functional ability resulting in improved performance and form. ?  ? ?PATIENT EDUCATION: ?Education details: Exercise purpose/form. Self  management techniques. Post-op precautions. Upcoming post-op appointment.  ?Person educated: Patient ?Education method: Explanation, Demonstration, Tactile cues, and Verbal cues ?Education comprehension: verbalized understanding, returned demonstration, verbal cues required, tactile cues required, and needs further education ? ? ?HOME EXERCISE PROGRAM: ?Access Code: [place] ?URL: [email or web address] ?Date: 06/11/2021 ?Prepared by: Rosita Kea ?  ?Exercises ?Seated Knee Flexion Stretch - 3 x daily - 2 sets - 10 reps - 10 seconds  hold ?Seated Long Arc Quad - 3 x daily - 3 sets - 10 reps - 5 seconds hold ?Supine Active Straight Leg Raise - 1 x daily - 3 sets - 5 reps ?Squat with Counter Support - 1 x daily - 3 sets - 10 reps ?Standing Knee Flexion Stretc

## 2021-07-01 ENCOUNTER — Ambulatory Visit: Payer: Medicare Other | Admitting: Physical Therapy

## 2021-07-01 ENCOUNTER — Encounter: Payer: Self-pay | Admitting: Physical Therapy

## 2021-07-01 DIAGNOSIS — M6281 Muscle weakness (generalized): Secondary | ICD-10-CM

## 2021-07-01 DIAGNOSIS — M25661 Stiffness of right knee, not elsewhere classified: Secondary | ICD-10-CM

## 2021-07-01 DIAGNOSIS — M25561 Pain in right knee: Secondary | ICD-10-CM | POA: Diagnosis not present

## 2021-07-01 DIAGNOSIS — G8929 Other chronic pain: Secondary | ICD-10-CM

## 2021-07-01 DIAGNOSIS — R262 Difficulty in walking, not elsewhere classified: Secondary | ICD-10-CM

## 2021-07-01 DIAGNOSIS — M25552 Pain in left hip: Secondary | ICD-10-CM

## 2021-07-01 NOTE — Therapy (Signed)
?OUTPATIENT PHYSICAL THERAPY TREATMENT NOTE  ? ? ? ?Patient Name: Ariel Davis ?MRN: [id number] ?DOB:[birth year], 56 y.o., female ?Today's Date: 07/01/2021 ? ?PCP: Herschell Dimes, NP ?REFERRING PROVIDER: Carola Rhine, MD ? ? PT End of Session - 07/01/21 1444   ? ? Visit Number 14   ? Number of Visits 24   ? Date for PT Re-Evaluation 09/02/21   ? Authorization Type [employer] MEDICARE reporting period from 05/28/2021   ? Progress Note Due on Visit 10   ? PT Start Time 1353   ? PT Stop Time 1431   ? PT Time Calculation (min) 38 min   ? Activity Tolerance Patient tolerated treatment well   ? Behavior During Therapy [facility] for tasks assessed/performed   ? ?  ?  ? ?  ? ? ? ?Past Medical History:  ?Diagnosis Date  ? Anxiety   ? Chronic back pain   ? H/O degenerative disc disease   ? Hypertension   ? Sciatica   ? ?Past Surgical History:  ?Procedure Laterality Date  ? breast bioosy    ? CHOLECYSTECTOMY    ? ENDOSCOPIC RETROGRADE CHOLANGIOPANCREATOGRAPHY (ERCP) WITH PROPOFOL N/A 06/17/2021  ? Procedure: ENDOSCOPIC RETROGRADE CHOLANGIOPANCREATOGRAPHY (ERCP) WITH PROPOFOL;  Surgeon: Midge Minium, MD;  Location: [facility] ENDOSCOPY;  Service: Endoscopy;  Laterality: N/A;  ? HYSTEROSCOPY    ? INTRAOPERATIVE CHOLANGIOGRAM N/A 06/16/2021  ? Procedure: INTRAOPERATIVE CHOLANGIOGRAM;  Surgeon: Carolan Shiver, MD;  Location: [facility] ORS;  Service: General;  Laterality: N/A;  ? TOTAL KNEE ARTHROPLASTY Right 04/09/2021  ? ?Patient Active Problem List  ? Diagnosis Date Noted  ? Obesity, Class III, BMI 40-49.9 (morbid obesity) ([facility]) 06/18/2021  ? Acute cholecystitis 06/17/2021  ? Choledocholithiasis   ? Elevated LFTs 06/15/2021  ? Gallstones   ? Jaundice   ? Arthritis of right knee 01/31/2018  ? DDD (degenerative disc disease), lumbar 01/31/2018  ? Pain medication agreement signed 01/31/2018  ? Chronic back pain 01/02/2018  ? Pain in both feet 08/31/2017  ? Knee pain, bilateral 07/05/2017  ? Depression 06/07/2017  ? OSA  (obstructive sleep apnea) 05/12/2017  ? Closed nondisplaced fracture of anterior wall of left acetabulum ([facility]) 08/27/2016  ? Localized osteoarthritis of right knee 08/27/2016  ? Closed fracture of posterior wall of left acetabulum ([facility]) 08/23/2016  ? Essential hypertension 06/21/2016  ? Allergic rhinitis 12/06/2013  ? Microcytic hypochromic anemia 12/06/2013  ? ? ?REFERRING DIAG: status post right total knee replacement, low back pain, L hip pain ? ?THERAPY DIAG:  ?Chronic pain of right knee ? ?Stiffness of right knee, not elsewhere classified ? ?Muscle weakness (generalized) ? ?Difficulty in walking, not elsewhere classified ? ?Chronic left-sided low back pain with left-sided sciatica ? ?Pain in left hip ? ?PERTINENT HISTORY: [name] who presents to outpatient physical therapy with a referral for medical diagnosis status post right total knee replacement, low back pain, L hip pain. This patient's chief complaints consist of right knee pain and stiffness following R TKA on 04/09/2021 as well as acute worsening of left low back and hip pain following TKA leading to the following functional deficits: difficulty with mobility including bed mobility, transfers, household and community ambulation, stairs; completing ADLs and IADLs such as cooking and cleaning; makes her unhappy because she "cannot do anything."  Relevant past medical history and comorbidities include chronic back pain (does intermittently go down both legs), anxiety, sleep apnea, left hip fracture (2018 still bothers her, non surgical), bilateral foot pain, microcytic  hypochromatic anemia, bilateral knee pain, obesity.  Patient denies hx of cancer, stroke, seizures, lung problem, major cardiac events, diabetes, unexplained weight loss, changes in bowel or bladder problems, new onset stumbling or dropping things, spinal surgery, left hip surgery. she was hospitalized on 06/15/2021.  She had a robotic cholecystectomy done by Dr.  Hazle Quantintron-Diaz on 06/16/2021.  The patient had an ERCP on 06/17/2021 which complete removal of choledocholithiasis was done by biliary sphincterotomy and balloon extraction. She was discharged home on 06/18/2021. She was advised on "no heavy lifting >20 pounds (children, pets, laundry, garbage) or strenuous activity until follow-up, but light activity and walking are encouraged" upon discharge ? ?PRECAUTIONS: Hospital discharge paperwork: "no heavy lifting >20 pounds (children, pets, laundry, garbage) or strenuous activity until follow-up, but light ?activity and walking are encouraged ? ?SUBJECTIVE: Patient reports her anterior right knee was really hurting her last night and this morning. She states she did not do much yesterday but did do some walking. She reports she felt pretty good after her last PT session. She has not been to her follow up for abdominal surgery yet. She states she has not concerns about her abdominal surgical site.  ? ?PAIN:  ?Are you having pain? Yes, 7/10 at anterior R knee, and 5/10 at left glute ? ?OBJECTIVE ?PROM ?R knee ?Flexion: 120 in closed chain position ? ?TODAY'S TREATMENT:  ?Therapeutic exercise: to centralize symptoms and improve ROM, strength, muscular endurance, and activity tolerance required for successful completion of functional activities.  ?- NuStep level 4-7 (5 is most appropriate) using bilateral lower extremities. Seat setting 11. For improved extremity mobility, muscular endurance, and activity tolerance; and to induce the analgesic effect of aerobic exercise, stimulate improved joint nutrition, and prepare body structures and systems for following interventions. x 6  minutes. Average SPM = 72.  ?- sit <> stand with no UE support from 24 inch surface 1x10.  ?- sit <> stand with no UE support from 21 inch surface 1x10, 1x5. Limited by pain at B anterior  knees, then wave of nausea at 5th rep of last set. (Discontinued) ?- provided cold pack and water (feeling better).   ?(Manual therapy - see below) ?- supine R SAQ, 3x10 R LE with 10# AW ?- supine L SAQ, 1x6@10 #, 1x10 AROM, 2x10 5#AW (limited by left glute pain that worsens with load and reps).  ?- standing R knee flexion stretch step standing on 12 inch step braced against stairs with B UE support, 1x15 with 5 second holds. ? ?Manual therapy: to reduce pain and tissue tension, improve range of motion, neuromodulation, in order to promote improved ability to complete functional activities. ?HOOKLYING ?- LAD through left hip 3x30-40 seconds  ?  ?Pt required multimodal cuing for proper technique and to facilitate improved neuromuscular control, strength, range of motion, and functional ability resulting in improved performance and form. ?  ? ?PATIENT EDUCATION: ?Education details: Exercise purpose/form. Self management techniques.  ?Person educated: Patient ?Education method: Explanation, Demonstration, Tactile cues, and Verbal cues ?Education comprehension: verbalized understanding, returned demonstration, verbal cues required, tactile cues required, and needs further education ? ? ?HOME EXERCISE PROGRAM: ?Access Code: [email or web address] ?URL: [email or web address] ?Date: 06/11/2021 ?Prepared by: Norton BlizzardSara Adeleigh Barletta ?  ?Exercises ?Seated Knee Flexion Stretch - 3 x daily - 2 sets - 10 reps - 10 seconds hold ?Seated Long Arc Quad - 3 x daily - 3 sets - 10 reps - 5 seconds hold ?Supine Active Straight Leg Raise - 1 x daily - 3 sets -  5 reps ?Squat with Counter Support - 1 x daily - 3 sets - 10 reps ?Standing Knee Flexion Stretch on Step - 1-3 x daily - 3 sets - 10 reps - 10 seconds hold ?Forward Step Up with Counter Support - 1 x daily - 2-3 sets - 10 reps ?Lying Prone - 2 x daily - 5 minutes hold ?  ?Patient Education ?Scar Massage ? ? PT Short Term Goals   ? ?  ? PT SHORT TERM GOAL #1  ? Title Be independent with initial home exercise program for self-management of symptoms.   ? Baseline Initial HEP provided at IE (04/28/2021);   ? Time  2   ? Period Weeks   ? Status Achieved   ? Target Date 05/12/21   ? ?  ?  ? ?  ? ? ? PT Long Term Goals   ?TARGET DATE FOR ALL LONG TERM GOALS: 07/21/2021. TARGET DATE UPDATED to 09/02/2021 for all unmet/new goals ?  ? PT L

## 2021-07-06 ENCOUNTER — Ambulatory Visit: Payer: Medicare Other | Attending: Orthopedic Surgery | Admitting: Physical Therapy

## 2021-07-06 ENCOUNTER — Encounter: Payer: Self-pay | Admitting: Physical Therapy

## 2021-07-06 DIAGNOSIS — G8929 Other chronic pain: Secondary | ICD-10-CM | POA: Diagnosis present

## 2021-07-06 DIAGNOSIS — M25552 Pain in left hip: Secondary | ICD-10-CM

## 2021-07-06 DIAGNOSIS — M5459 Other low back pain: Secondary | ICD-10-CM | POA: Diagnosis present

## 2021-07-06 DIAGNOSIS — M25561 Pain in right knee: Secondary | ICD-10-CM | POA: Insufficient documentation

## 2021-07-06 DIAGNOSIS — M5442 Lumbago with sciatica, left side: Secondary | ICD-10-CM | POA: Diagnosis present

## 2021-07-06 DIAGNOSIS — M6281 Muscle weakness (generalized): Secondary | ICD-10-CM | POA: Diagnosis present

## 2021-07-06 DIAGNOSIS — R262 Difficulty in walking, not elsewhere classified: Secondary | ICD-10-CM | POA: Diagnosis present

## 2021-07-06 DIAGNOSIS — M25661 Stiffness of right knee, not elsewhere classified: Secondary | ICD-10-CM | POA: Diagnosis present

## 2021-07-06 NOTE — Therapy (Signed)
?OUTPATIENT PHYSICAL THERAPY TREATMENT NOTE  ? ? ? ?Patient Name: Ariel Davis ?MRN: [id number] ?DOB:10-Jan-1966, 56 y.o., female ?Today's Date: 07/06/2021 ? ?PCP: Willaim Rayas, NP ?REFERRING PROVIDER: Merlene Morse, MD ? ? PT End of Session - 07/06/21 1305   ? ? Visit Number 15   ? Number of Visits 24   ? Date for PT Re-Evaluation 09/02/21   ? Authorization Type [employer]ICARE reporting period from 05/28/2021   ? Progress Note Due on Visit 10   ? PT Start Time 1300   ? PT Stop Time 1340   ? PT Time Calculation (min) 40 min   ? Activity Tolerance Patient tolerated treatment well   ? Behavior During Therapy [facility] for tasks assessed/performed   ? ?  ?  ? ?  ? ? ? ? ?Past Medical History:  ?Diagnosis Date  ? Anxiety   ? Chronic back pain   ? H/O degenerative disc disease   ? Hypertension   ? Sciatica   ? ?Past Surgical History:  ?Procedure Laterality Date  ? breast bioosy    ? CHOLECYSTECTOMY    ? ENDOSCOPIC RETROGRADE CHOLANGIOPANCREATOGRAPHY (ERCP) WITH PROPOFOL N/A 06/17/2021  ? Procedure: ENDOSCOPIC RETROGRADE CHOLANGIOPANCREATOGRAPHY (ERCP) WITH PROPOFOL;  Surgeon: Lucilla Lame, MD;  Location: [facility] ENDOSCOPY;  Service: Endoscopy;  Laterality: N/A;  ? HYSTEROSCOPY    ? INTRAOPERATIVE CHOLANGIOGRAM N/A 06/16/2021  ? Procedure: INTRAOPERATIVE CHOLANGIOGRAM;  Surgeon: Herbert Pun, MD;  Location: [facility] ORS;  Service: General;  Laterality: N/A;  ? TOTAL KNEE ARTHROPLASTY Right 04/09/2021  ? ?Patient Active Problem List  ? Diagnosis Date Noted  ? Obesity, Class III, BMI 40-49.9 (morbid obesity) ([place]) 06/18/2021  ? Acute cholecystitis 06/17/2021  ? Choledocholithiasis   ? Elevated LFTs 06/15/2021  ? Gallstones   ? Jaundice   ? Arthritis of right knee 01/31/2018  ? DDD (degenerative disc disease), lumbar 01/31/2018  ? Pain medication agreement signed 01/31/2018  ? Chronic back pain 01/02/2018  ? Pain in both feet 08/31/2017  ? Knee pain, bilateral 07/05/2017  ? Depression 06/07/2017  ? OSA  (obstructive sleep apnea) 05/12/2017  ? Closed nondisplaced fracture of anterior wall of left acetabulum ([place]) 08/27/2016  ? Localized osteoarthritis of right knee 08/27/2016  ? Closed fracture of posterior wall of left acetabulum ([place]) 08/23/2016  ? Essential hypertension 06/21/2016  ? Allergic rhinitis 12/06/2013  ? Microcytic hypochromic anemia 12/06/2013  ? ? ?REFERRING DIAG: status post right total knee replacement, low back pain, L hip pain ? ?THERAPY DIAG:  ?Chronic pain of right knee ? ?Stiffness of right knee, not elsewhere classified ? ?Muscle weakness (generalized) ? ?Difficulty in walking, not elsewhere classified ? ?Chronic left-sided low back pain with left-sided sciatica ? ?Pain in left hip ? ?PERTINENT HISTORY: [name] who presents to outpatient physical therapy with a referral for medical diagnosis status post right total knee replacement, low back pain, L hip pain. This patient's chief complaints consist of right knee pain and stiffness following R TKA on 04/09/2021 as well as acute worsening of left low back and hip pain following TKA leading to the following functional deficits: difficulty with mobility including bed mobility, transfers, household and community ambulation, stairs; completing ADLs and IADLs such as cooking and cleaning; makes her unhappy because she "cannot do anything."  Relevant past medical history and comorbidities include chronic back pain (does intermittently go down both legs), anxiety, sleep apnea, left hip fracture (2018 still bothers her, non surgical), bilateral foot pain,  microcytic hypochromatic anemia, bilateral knee pain, obesity.  Patient denies hx of cancer, stroke, seizures, lung problem, major cardiac events, diabetes, unexplained weight loss, changes in bowel or bladder problems, new onset stumbling or dropping things, spinal surgery, left hip surgery. she was hospitalized on 06/15/2021.  She had a robotic cholecystectomy done by Dr.  Windell Moment on 06/16/2021.  The patient had an ERCP on 06/17/2021 which complete removal of choledocholithiasis was done by biliary sphincterotomy and balloon extraction. She was discharged home on 06/18/2021. She was advised on "no heavy lifting >20 pounds (children, pets, laundry, garbage) or strenuous activity until follow-up, but light activity and walking are encouraged" upon discharge ? ?PRECAUTIONS: Hospital discharge paperwork: "no heavy lifting >20 pounds (children, pets, laundry, garbage) or strenuous activity until follow-up, but light ?activity and walking are encouraged ? ?SUBJECTIVE: Patient reports she is feeling "so-so" today and her left glute/hip is bothering her. She rates her pain there 6/10. She reports her right knee has been bothering her knee "sort of a fit" and rates the pain there 6/10. States "it's not really bothering me right now, just a little bit, maybe a 6/10." She states she felt okay after last PT session. She states she has been doing her HEP. Reports she feels like she is healing well from her abdominal surgery.  ? ?PAIN:  ?Are you having pain? Yes, 6/10 at anterior R knee, and 6/10 at left glute ? ? ?TODAY'S TREATMENT:  ?Therapeutic exercise: to centralize symptoms and improve ROM, strength, muscular endurance, and activity tolerance required for successful completion of functional activities.  ?- NuStep level 5 using bilateral lower extremities. Seat setting 11. For improved extremity mobility, muscular endurance, and activity tolerance; and to induce the analgesic effect of aerobic exercise, stimulate improved joint nutrition, and prepare body structures and systems for following interventions. x 6  minutes. Average SPM = 67.  ?(Manual therapy - see below) ?- hooklying glue squeeze, 5 second hold, 1x10 ?- hooklying glue squeeze with B hip abduction against BlueTB around knees, 2x10 with 5 second hold. (Pain in R knee after).  ?- hooklying L knee to opposite shoulder stretch, 1x ~  15 seconds (discontinued due to R knee pain after last exercise).  ?- seated L knee to opposite shoulder piriformis stretch, 3x30 seconds. (Also tied figure 4 position but did not feel stretch in correct place).  ?- seated L sciatic nerve glide, 1x15 ?- seated B knee extension at [employer]ME[employer] machine, 2x10 at 10# ?- seated B hamstring curls at [place], 2x10 at 20# ?- Education on HEP including handout  ? ?Manual therapy: to reduce pain and tissue tension, improve range of motion, neuromodulation, in order to promote improved ability to complete functional activities. ?HOOKLYING ?- LAD through left hip 3x30-40 seconds  ?  ?Pt required multimodal cuing for proper technique and to facilitate improved neuromuscular control, strength, range of motion, and functional ability resulting in improved performance and form. ?  ? ?PATIENT EDUCATION: ?Education details: Exercise purpose/form. Self management techniques.  ?Person educated: Patient ?Education method: Explanation, Demonstration, Tactile cues, and Verbal cues ?Education comprehension: verbalized understanding, returned demonstration, verbal cues required, tactile cues required, and needs further education ? ? ?HOME EXERCISE PROGRAM: ?Access Code: [place] ?URL: [email or web address] ?Date: 07/06/2021 ?Prepared by: Rosita Kea ? ?Exercises ?- Seated Knee Flexion Stretch  - 3 x daily - 2 sets - 10 reps - 10 seconds hold ?- Seated Long Arc Quad  - 3 x daily - 3 sets - 10 reps - 5  seconds hold ?- Squat with Counter Support  - 1 x daily - 3 sets - 10 reps ?- Standing Knee Flexion Stretch on Step  - 1-3 x daily - 3 sets - 10 reps - 10 seconds hold ?- Forward Step Up with Counter Support  - 1 x daily - 2-3 sets - 10 reps ?- Lying Prone  - 2 x daily - 5 minutes hold ?- Seated Hip Abduction with Resistance  - 1 x daily - 3 sets - 10 reps - 5 seconds hold ?- Seated Piriformis Stretch  - 1 x daily - 3 sets - 30 seconds hold ?- Seated Slump Nerve Glide  - 2 x daily  - 1 sets - 15 reps ? ? PT Short Term Goals   ? ?  ? PT SHORT TERM GOAL #1  ? Title Be independent with initial home exercise program for self-management of symptoms.   ? Baseline Initial HEP provided at IE (04/28/2021

## 2021-07-08 ENCOUNTER — Ambulatory Visit: Payer: Medicare Other | Admitting: Physical Therapy

## 2021-07-14 ENCOUNTER — Ambulatory Visit: Payer: Medicare Other | Admitting: Physical Therapy

## 2021-07-14 ENCOUNTER — Encounter: Payer: Self-pay | Admitting: Physical Therapy

## 2021-07-14 DIAGNOSIS — M5459 Other low back pain: Secondary | ICD-10-CM

## 2021-07-14 DIAGNOSIS — R262 Difficulty in walking, not elsewhere classified: Secondary | ICD-10-CM

## 2021-07-14 DIAGNOSIS — M6281 Muscle weakness (generalized): Secondary | ICD-10-CM

## 2021-07-14 DIAGNOSIS — M25661 Stiffness of right knee, not elsewhere classified: Secondary | ICD-10-CM

## 2021-07-14 DIAGNOSIS — M25552 Pain in left hip: Secondary | ICD-10-CM

## 2021-07-14 DIAGNOSIS — M25561 Pain in right knee: Secondary | ICD-10-CM | POA: Diagnosis not present

## 2021-07-14 DIAGNOSIS — G8929 Other chronic pain: Secondary | ICD-10-CM

## 2021-07-14 NOTE — Therapy (Addendum)
?OUTPATIENT PHYSICAL THERAPY TREATMENT NOTE  ? ? ? ?Patient Name: Ariel Davis ?MRN: [id number] ?DOB:04/22/1965, 56 y.o., female ?Today's Date: 07/14/2021 ? ?PCP: Herschell Dimeslapp, Emorie J, NP ?REFERRING PROVIDER: Carola Rhinelcott, Christopher, MD ? ? PT End of Session - 07/14/21 1420   ? ? Visit Number 16   ? Number of Visits 24   ? Date for PT Re-Evaluation 09/02/21   ? Authorization Type [employer] MEDICARE reporting period from 05/28/2021   ? Progress Note Due on Visit 20   ? PT Start Time 1352   ? PT Stop Time 1430   ? PT Time Calculation (min) 38 min   ? Activity Tolerance Patient tolerated treatment well   ? [facility] assessed/performed   ? ?  ?  ? ?  ? ? ? ? ? ?Past Medical History:  ?Diagnosis Date  ? Anxiety   ? Chronic back pain   ? H/O degenerative disc disease   ? Hypertension   ? Sciatica   ? ?Past Surgical History:  ?Procedure Laterality Date  ? breast bioosy    ? CHOLECYSTECTOMY    ? ENDOSCOPIC RETROGRADE CHOLANGIOPANCREATOGRAPHY (ERCP) WITH PROPOFOL N/A 06/17/2021  ? Procedure: ENDOSCOPIC RETROGRADE CHOLANGIOPANCREATOGRAPHY (ERCP) WITH PROPOFOL;  Surgeon: Midge MiniumWohl, Darren, MD;  Location: [facility] ENDOSCOPY;  Service: Endoscopy;  Laterality: N/A;  ? HYSTEROSCOPY    ? INTRAOPERATIVE CHOLANGIOGRAM N/A 06/16/2021  ? Procedure: INTRAOPERATIVE CHOLANGIOGRAM;  Surgeon: Carolan Shiverintron-Diaz, Edgardo, MD;  Location: [facility] ORS;  Service: General;  Laterality: N/A;  ? TOTAL KNEE ARTHROPLASTY Right 04/09/2021  ? ?Patient Active Problem List  ? Diagnosis Date Noted  ? Obesity, Class III, BMI 40-49.9 (morbid obesity) ([facility]) 06/18/2021  ? Acute cholecystitis 06/17/2021  ? Choledocholithiasis   ? Elevated LFTs 06/15/2021  ? Gallstones   ? Jaundice   ? Arthritis of right knee 01/31/2018  ? DDD (degenerative disc disease), lumbar 01/31/2018  ? Pain medication agreement signed 01/31/2018  ? Chronic back pain 01/02/2018  ? Pain in both feet 08/31/2017  ? Knee pain, bilateral 07/05/2017  ? Depression 06/07/2017  ? OSA  (obstructive sleep apnea) 05/12/2017  ? Closed nondisplaced fracture of anterior wall of left acetabulum ([facility]) 08/27/2016  ? Localized osteoarthritis of right knee 08/27/2016  ? Closed fracture of posterior wall of left acetabulum ([facility]) 08/23/2016  ? Essential hypertension 06/21/2016  ? Allergic rhinitis 12/06/2013  ? Microcytic hypochromic anemia 12/06/2013  ? ? ?REFERRING DIAG: status post right total knee replacement, low back pain, L hip pain ? ?THERAPY DIAG:  ?Chronic pain of right knee ? ?Stiffness of right knee, not elsewhere classified ? ?Muscle weakness (generalized) ? ?Difficulty in walking, not elsewhere classified ? ?Other low back pain ? ?Pain in left hip ? ?PERTINENT HISTORY: [name] who presents to outpatient physical therapy with a referral for medical diagnosis status post right total knee replacement, low back pain, L hip pain. This patient's chief complaints consist of right knee pain and stiffness following R TKA on 04/09/2021 as well as acute worsening of left low back and hip pain following TKA leading to the following functional deficits: difficulty with mobility including bed mobility, transfers, household and community ambulation, stairs; completing ADLs and IADLs such as cooking and cleaning; makes her unhappy because she "cannot do anything."  Relevant past medical history and comorbidities include chronic back pain (does intermittently go down both legs), anxiety, sleep apnea, left hip fracture (2018 still bothers her, non surgical), bilateral foot pain, microcytic hypochromatic anemia,  bilateral knee pain, obesity.  Patient denies hx of cancer, stroke, seizures, lung problem, major cardiac events, diabetes, unexplained weight loss, changes in bowel or bladder problems, new onset stumbling or dropping things, spinal surgery, left hip surgery. she was hospitalized on 06/15/2021.  She had a robotic cholecystectomy done by Dr. Hazle Quant on 06/16/2021.  The patient had an  ERCP on 06/17/2021 which complete removal of choledocholithiasis was done by biliary sphincterotomy and balloon extraction. She was discharged home on 06/18/2021. She was advised on "no heavy lifting >20 pounds (children, pets, laundry, garbage) or strenuous activity until follow-up, but light activity and walking are encouraged" upon discharge ? ?PRECAUTIONS: Hospital discharge paperwork: "no heavy lifting >20 pounds (children, pets, laundry, garbage) or strenuous activity until follow-up, but light ?activity and walking are encouraged ? ?SUBJECTIVE: Patient reports she missed her last PT session because she was in so much pain at her left glute/hip. She states she has been having trouble getting her belbucca and her glute/hip got worse when she ran out. She thinks it will be ready today. She states she is feeling better today. She thinks she may be feeling better today because she took some pain medication she doesn't like to take (narcotic pain med). She states she has been doing her HEP and her right knee only hurts when she works it out. She slept very good yesterday after she took narcotic pain meds.  She states her left glute/hip pain has been bad but it was "critical" without the [place]. She state she felt good going out the door when she left PT last time. States the pain really kicks in when she sits down.  ? ?PAIN:  ?Are you having pain? Yes, 7-8/10 at left glute/buttocks, ? ?TODAY'S TREATMENT:  ?Therapeutic exercise: to centralize symptoms and improve ROM, strength, muscular endurance, and activity tolerance required for successful completion of functional activities.  ?- NuStep level 6 using bilateral lower extremities. Seat setting 11. For improved extremity mobility, muscular endurance, and activity tolerance; and to induce the analgesic effect of aerobic exercise, stimulate improved joint nutrition, and prepare body structures and systems for following interventions. x 9  minutes. Average SPM = 74.   ?(Manual therapy - see below) ?- hooklying LTR with green theraball under legs, 2 min ?- hooklying DKC with green theraball under legs, 2 min ?- supine glute set/bridge with B LE extended on green theraball (under lower legs), 3x10 (height as tolerated, closer to glute set at this point).  ?- hooklying glue squeeze with B hip abduction against BlueTB around knees, 1x20 with 1 second hold.  ?- seated B knee extension at [facility] machine, 2x15 at 10# (15# too heavy due to pain) ?- seated B hamstring curls at [facility] machine, 2x15 at 20# ? ?Manual therapy: to reduce pain and tissue tension, improve range of motion, neuromodulation, in order to promote improved ability to complete functional activities. ?HOOKLYING ?- LAD through left hip 3x30-40 seconds  ?  ?Pt required multimodal cuing for proper technique and to facilitate improved neuromuscular control, strength, range of motion, and functional ability resulting in improved performance and form. ?  ? ?PATIENT EDUCATION: ?Education details: Exercise purpose/form. Self management techniques.  ?Person educated: Patient ?Education method: Explanation, Demonstration, Tactile cues, and Verbal cues ?Education comprehension: verbalized understanding, returned demonstration, verbal cues required, tactile cues required, and needs further education ? ? ?HOME EXERCISE PROGRAM: ?Access Code: [email or web address] ?URL: [email or web address] ?Date: 07/06/2021 ?Prepared by: Norton Blizzard ? ?Exercises ?- Seated Knee Flexion Stretch  - 3  x daily - 2 sets - 10 reps - 10 seconds hold ?- Seated Long Arc Quad  - 3 x daily - 3 sets - 10 reps - 5 seconds hold ?- Squat with Counter Support  - 1 x daily - 3 sets - 10 reps ?- Standing Knee Flexion Stretch on Step  - 1-3 x daily - 3 sets - 10 reps - 10 seconds hold ?- Forward Step Up with Counter Support  - 1 x daily - 2-3 sets - 10 reps ?- Lying Prone  - 2 x daily - 5 minutes hold ?- Seated Hip Abduction with Resistance  - 1 x daily - 3 sets - 10  reps - 5 seconds hold ?- Seated Piriformis Stretch  - 1 x daily - 3 sets - 30 seconds hold ?- Seated Slump Nerve Glide  - 2 x daily - 1 sets - 15 reps ? ? PT Short Term Goals   ? ?  ? PT SHORT TERM GOAL #1

## 2021-07-16 ENCOUNTER — Ambulatory Visit: Payer: Medicare Other | Admitting: Physical Therapy

## 2021-07-16 ENCOUNTER — Encounter: Payer: Self-pay | Admitting: Physical Therapy

## 2021-07-16 VITALS — BP 98/68 | HR 86

## 2021-07-16 DIAGNOSIS — G8929 Other chronic pain: Secondary | ICD-10-CM

## 2021-07-16 DIAGNOSIS — M25552 Pain in left hip: Secondary | ICD-10-CM

## 2021-07-16 DIAGNOSIS — M25561 Pain in right knee: Secondary | ICD-10-CM | POA: Diagnosis not present

## 2021-07-16 DIAGNOSIS — M25661 Stiffness of right knee, not elsewhere classified: Secondary | ICD-10-CM

## 2021-07-16 DIAGNOSIS — M5459 Other low back pain: Secondary | ICD-10-CM

## 2021-07-16 DIAGNOSIS — M6281 Muscle weakness (generalized): Secondary | ICD-10-CM

## 2021-07-16 DIAGNOSIS — R262 Difficulty in walking, not elsewhere classified: Secondary | ICD-10-CM

## 2021-07-16 NOTE — Therapy (Signed)
?OUTPATIENT PHYSICAL THERAPY TREATMENT NOTE  ? ? ? ?Patient Name: Ariel Davis ?MRN: [id number] ?DOB:December 22, 1965, 56 y.o., female ?Today's Date: 07/16/2021 ? ?PCP: Herschell Dimes, NP ?REFERRING PROVIDER: Carola Rhine, MD ? ? PT End of Session - 07/16/21 1404   ? ? Visit Number 17   ? Number of Visits 24   ? Date for PT Re-Evaluation 09/02/21   ? Authorization Type [employer]ARE reporting period from 05/28/2021   ? Progress Note Due on Visit 20   ? PT Start Time 1353   ? PT Stop Time 1437   ? PT Time Calculation (min) 44 min   ? Activity Tolerance --   ? Behavior During Therapy [facility] for tasks assessed/performed   ? ?  ?  ? ?  ? ? ? ? ? ? ?Past Medical History:  ?Diagnosis Date  ? Anxiety   ? Chronic back pain   ? H/O degenerative disc disease   ? Hypertension   ? Sciatica   ? ?Past Surgical History:  ?Procedure Laterality Date  ? breast bioosy    ? CHOLECYSTECTOMY    ? ENDOSCOPIC RETROGRADE CHOLANGIOPANCREATOGRAPHY (ERCP) WITH PROPOFOL N/A 06/17/2021  ? Procedure: ENDOSCOPIC RETROGRADE CHOLANGIOPANCREATOGRAPHY (ERCP) WITH PROPOFOL;  Surgeon: Midge Minium, MD;  Location: [facility] ENDOSCOPY;  Service: Endoscopy;  Laterality: N/A;  ? HYSTEROSCOPY    ? INTRAOPERATIVE CHOLANGIOGRAM N/A 06/16/2021  ? Procedure: INTRAOPERATIVE CHOLANGIOGRAM;  Surgeon: Carolan Shiver, MD;  Location: [facility] ORS;  Service: General;  Laterality: N/A;  ? TOTAL KNEE ARTHROPLASTY Right 04/09/2021  ? ?Patient Active Problem List  ? Diagnosis Date Noted  ? Obesity, Class III, BMI 40-49.9 (morbid obesity) (HCC) 06/18/2021  ? Acute cholecystitis 06/17/2021  ? Choledocholithiasis   ? Elevated LFTs 06/15/2021  ? Gallstones   ? Jaundice   ? Arthritis of right knee 01/31/2018  ? DDD (degenerative disc disease), lumbar 01/31/2018  ? Pain medication agreement signed 01/31/2018  ? Chronic back pain 01/02/2018  ? Pain in both feet 08/31/2017  ? Knee pain, bilateral 07/05/2017  ? Depression 06/07/2017  ? OSA (obstructive sleep apnea)  05/12/2017  ? Closed nondisplaced fracture of anterior wall of left acetabulum (HCC) 08/27/2016  ? Localized osteoarthritis of right knee 08/27/2016  ? Closed fracture of posterior wall of left acetabulum (HCC) 08/23/2016  ? Essential hypertension 06/21/2016  ? Allergic rhinitis 12/06/2013  ? Microcytic hypochromic anemia 12/06/2013  ? ? ?REFERRING DIAG: status post right total knee replacement, low back pain, L hip pain ? ?THERAPY DIAG:  ?Chronic pain of right knee ? ?Stiffness of right knee, not elsewhere classified ? ?Muscle weakness (generalized) ? ?Difficulty in walking, not elsewhere classified ? ?Other low back pain ? ?Pain in left hip ? ?PERTINENT HISTORY: [name] who presents to outpatient physical therapy with a referral for medical diagnosis status post right total knee replacement, low back pain, L hip pain. This patient's chief complaints consist of right knee pain and stiffness following R TKA on 04/09/2021 as well as acute worsening of left low back and hip pain following TKA leading to the following functional deficits: difficulty with mobility including bed mobility, transfers, household and community ambulation, stairs; completing ADLs and IADLs such as cooking and cleaning; makes her unhappy because she "cannot do anything."  Relevant past medical history and comorbidities include chronic back pain (does intermittently go down both legs), anxiety, sleep apnea, left hip fracture (2018 still bothers her, non surgical), bilateral foot pain, microcytic hypochromatic anemia, bilateral knee  pain, obesity.  Patient denies hx of cancer, stroke, seizures, lung problem, major cardiac events, diabetes, unexplained weight loss, changes in bowel or bladder problems, new onset stumbling or dropping things, spinal surgery, left hip surgery. she was hospitalized on 06/15/2021.  She had a robotic cholecystectomy done by Dr. Hazle Quant on 06/16/2021.  The patient had an ERCP on 06/17/2021 which  complete removal of choledocholithiasis was done by biliary sphincterotomy and balloon extraction. She was discharged home on 06/18/2021. She was advised on "no heavy lifting >20 pounds (children, pets, laundry, garbage) or strenuous activity until follow-up, but light activity and walking are encouraged" upon discharge ? ?PRECAUTIONS: Hospital discharge paperwork: "no heavy lifting >20 pounds (children, pets, laundry, garbage) or strenuous activity until follow-up, but light ?activity and walking are encouraged ? ?SUBJECTIVE: Patient reports she felt very lightheaded and sick to her stomach after she got home from her last PT session. She states her left glute/hip is hurting today but her right knee is not hurting.  She states she last ate at breakfast. She is currently a little lightheaded but not too much and her mouth is dry. She states she has been drinking enough water and has not changed her BP meds recently. She got her belbucca and has stopped taking hydrocodone. She states she is sad that she cannot go out and work in the yard on this nice day because of her physical condition.  ? ?PAIN:  ?Are you having pain? Yes, 7/10 at left glute/buttocks ? ?OBJECTIVE ?  ?Vitals:  ? 07/16/21 1357 07/16/21 1407 07/16/21 1435  ?BP: (!) 109/57 116/80 98/68  ?Pulse: 85 (!) 101 86  ?SpO2: 97% 99% 98%  ?Seated right arm, adult large automatic cuff ?1st measurement prior to exercise ?2nd measurement after nustep.  ?3rd measurement end of session.  ? ? ?TODAY'S TREATMENT:  ?Therapeutic exercise: to centralize symptoms and improve ROM, strength, muscular endurance, and activity tolerance required for successful completion of functional activities.  ?- vitals measurement for safety (see above) ?- NuStep level 6 using bilateral lower extremities. Seat setting 11. For improved extremity mobility, muscular endurance, and activity tolerance; and to induce the analgesic effect of aerobic exercise, stimulate improved joint nutrition,  and prepare body structures and systems for following interventions. x 5  minutes. Average SPM = 79  ?- vitals measurement for safety (see above) ? ?(Manual therapy - see below) ? ?- seated glue squeeze with B hip abduction against BlueTB around knees, 1x20 with 1 second hold.  ?- seated B knee extension at [facility] machine, 2x15 at 10# ?- seated B hamstring curls at [facility] machine, 2x15 at 20# ?- seated lumbar flexion ball roll out on clear theraball, 2 min ?- vitals measurement for safety (see above) ? ?Manual therapy: to reduce pain and tissue tension, improve range of motion, neuromodulation, in order to promote improved ability to complete functional activities. ?HOOKLYING ?- LAD through left hip 3x30 seconds  ?  ?Pt required multimodal cuing for proper technique and to facilitate improved neuromuscular control, strength, range of motion, and functional ability resulting in improved performance and form. ?  ? ?PATIENT EDUCATION: ?Education details: Exercise purpose/form. Self management techniques.  ?Person educated: Patient ?Education method: Explanation, Demonstration, Tactile cues, and Verbal cues ?Education comprehension: verbalized understanding, returned demonstration, verbal cues required, tactile cues required, and needs further education ? ? ?HOME EXERCISE PROGRAM: ?Access Code: [email or web address] ?URL: [email or web address] ?Date: 07/06/2021 ?Prepared by: Norton Blizzard ? ?Exercises ?- Seated Knee Flexion Stretch  - 3 x daily -  2 sets - 10 reps - 10 seconds hold ?- Seated Long Arc Quad  - 3 x daily - 3 sets - 10 reps - 5 seconds hold ?- Squat with Counter Support  - 1 x daily - 3 sets - 10 reps ?- Standing Knee Flexion Stretch on Step  - 1-3 x daily - 3 sets - 10 reps - 10 seconds hold ?- Forward Step Up with Counter Support  - 1 x daily - 2-3 sets - 10 reps ?- Lying Prone  - 2 x daily - 5 minutes hold ?- Seated Hip Abduction with Resistance  - 1 x daily - 3 sets - 10 reps - 5 seconds hold ?- Seated  Piriformis Stretch  - 1 x daily - 3 sets - 30 seconds hold ?- Seated Slump Nerve Glide  - 2 x daily - 1 sets - 15 reps ? ? PT Short Term Goals   ? ?  ? PT SHORT TERM GOAL #1  ? Title Be independent with initial hom

## 2021-07-21 ENCOUNTER — Ambulatory Visit: Payer: Medicare Other | Admitting: Physical Therapy

## 2021-07-23 ENCOUNTER — Ambulatory Visit: Payer: Medicare Other | Admitting: Physical Therapy

## 2021-07-23 ENCOUNTER — Encounter: Payer: Self-pay | Admitting: Physical Therapy

## 2021-07-23 VITALS — BP 111/63 | HR 85

## 2021-07-23 DIAGNOSIS — R262 Difficulty in walking, not elsewhere classified: Secondary | ICD-10-CM

## 2021-07-23 DIAGNOSIS — M5459 Other low back pain: Secondary | ICD-10-CM

## 2021-07-23 DIAGNOSIS — G8929 Other chronic pain: Secondary | ICD-10-CM

## 2021-07-23 DIAGNOSIS — M6281 Muscle weakness (generalized): Secondary | ICD-10-CM

## 2021-07-23 DIAGNOSIS — M25552 Pain in left hip: Secondary | ICD-10-CM

## 2021-07-23 DIAGNOSIS — M25561 Pain in right knee: Secondary | ICD-10-CM | POA: Diagnosis not present

## 2021-07-23 DIAGNOSIS — M25661 Stiffness of right knee, not elsewhere classified: Secondary | ICD-10-CM

## 2021-07-23 NOTE — Therapy (Signed)
?OUTPATIENT PHYSICAL THERAPY TREATMENT NOTE  ? ? ? ?Patient Name: Ariel Davis ?MRN: [id number] ?DOB:[birth year], 56 y.o., female ?Today's Date: 07/23/2021 ? ?PCP: Willaim Rayas, NP ?REFERRING PROVIDER: Merlene Morse, MD ? ? PT End of Session - 07/23/21 1358   ? ? Visit Number 18   ? Number of Visits 24   ? Date for PT Re-Evaluation 09/02/21   ? Authorization Type [employer] MEDICARE reporting period from 05/28/2021   ? Progress Note Due on Visit 20   ? PT Start Time 4098   ? PT Stop Time 1191   ? PT Time Calculation (min) 30 min   ? Behavior During Therapy [facility] for tasks assessed/performed   ? ?  ?  ? ?  ? ? ? ? ? ? ? ?Past Medical History:  ?Diagnosis Date  ? Anxiety   ? Chronic back pain   ? H/O degenerative disc disease   ? Hypertension   ? Sciatica   ? ?Past Surgical History:  ?Procedure Laterality Date  ? breast bioosy    ? CHOLECYSTECTOMY    ? ENDOSCOPIC RETROGRADE CHOLANGIOPANCREATOGRAPHY (ERCP) WITH PROPOFOL N/A 06/17/2021  ? Procedure: ENDOSCOPIC RETROGRADE CHOLANGIOPANCREATOGRAPHY (ERCP) WITH PROPOFOL;  Surgeon: Lucilla Lame, MD;  Location: [facility] ENDOSCOPY;  Service: Endoscopy;  Laterality: N/A;  ? HYSTEROSCOPY    ? INTRAOPERATIVE CHOLANGIOGRAM N/A 06/16/2021  ? Procedure: INTRAOPERATIVE CHOLANGIOGRAM;  Surgeon: Herbert Pun, MD;  Location: [facility] ORS;  Service: General;  Laterality: N/A;  ? TOTAL KNEE ARTHROPLASTY Right 04/09/2021  ? ?Patient Active Problem List  ? Diagnosis Date Noted  ? Obesity, Class III, BMI 40-49.9 (morbid obesity) ([place]) 06/18/2021  ? Acute cholecystitis 06/17/2021  ? Choledocholithiasis   ? Elevated LFTs 06/15/2021  ? Gallstones   ? Jaundice   ? Arthritis of right knee 01/31/2018  ? DDD (degenerative disc disease), lumbar 01/31/2018  ? Pain medication agreement signed 01/31/2018  ? Chronic back pain 01/02/2018  ? Pain in both feet 08/31/2017  ? Knee pain, bilateral 07/05/2017  ? Depression 06/07/2017  ? OSA (obstructive sleep apnea) 05/12/2017  ? Closed  nondisplaced fracture of anterior wall of left acetabulum ([place]) 08/27/2016  ? Localized osteoarthritis of right knee 08/27/2016  ? Closed fracture of posterior wall of left acetabulum ([place]) 08/23/2016  ? Essential hypertension 06/21/2016  ? Allergic rhinitis 12/06/2013  ? Microcytic hypochromic anemia 12/06/2013  ? ? ?REFERRING DIAG: status post right total knee replacement, low back pain, L hip pain ? ?THERAPY DIAG:  ?Chronic pain of right knee ? ?Stiffness of right knee, not elsewhere classified ? ?Muscle weakness (generalized) ? ?Difficulty in walking, not elsewhere classified ? ?Other low back pain ? ?Pain in left hip ? ?PERTINENT HISTORY: [name] who presents to outpatient physical therapy with a referral for medical diagnosis status post right total knee replacement, low back pain, L hip pain. This patient's chief complaints consist of right knee pain and stiffness following R TKA on 04/09/2021 as well as acute worsening of left low back and hip pain following TKA leading to the following functional deficits: difficulty with mobility including bed mobility, transfers, household and community ambulation, stairs; completing ADLs and IADLs such as cooking and cleaning; makes her unhappy because she "cannot do anything."  Relevant past medical history and comorbidities include chronic back pain (does intermittently go down both legs), anxiety, sleep apnea, left hip fracture (2018 still bothers her, non surgical), bilateral foot pain, microcytic hypochromatic anemia, bilateral knee pain, obesity.  Patient denies  hx of cancer, stroke, seizures, lung problem, major cardiac events, diabetes, unexplained weight loss, changes in bowel or bladder problems, new onset stumbling or dropping things, spinal surgery, left hip surgery. she was hospitalized on 06/15/2021.  She had a robotic cholecystectomy done by Dr. Windell Moment on 06/16/2021.  The patient had an ERCP on 06/17/2021 which complete removal of  choledocholithiasis was done by biliary sphincterotomy and balloon extraction. She was discharged home on 06/18/2021. She was advised on "no heavy lifting >20 pounds (children, pets, laundry, garbage) or strenuous activity until follow-up, but light activity and walking are encouraged" upon discharge ? ?PRECAUTIONS: Plan from MD note 07/02/2021 for laproscopic surgery follow up: "Increase activity as tolerated." ? ?SUBJECTIVE: Patient states she is feeling well with no pain upon arrival. She arrives with RW. She states she had pain yesterday and her pain did not change after last PT session. She denies nausea.  ? ?PAIN:  ?Are you having pain? no ? ?OBJECTIVE ?  ?Vitals:  ? 07/23/21 1400  ?BP: 111/63  ?Pulse: 85  ?SpO2: 100%  ?Seated right arm, adult large automatic cuff ?1st measurement prior to exercise ? ? ?TODAY'S TREATMENT:  ?Therapeutic exercise: to centralize symptoms and improve ROM, strength, muscular endurance, and activity tolerance required for successful completion of functional activities.  ?- vitals measurement for safety (see above) ?- NuStep level 6 using bilateral lower extremities. Seat setting 11. For improved extremity mobility, muscular endurance, and activity tolerance; and to induce the analgesic effect of aerobic exercise, stimulate improved joint nutrition, and prepare body structures and systems for following interventions. x 5  minutes. Average SPM = 79 (hard, unable to continue conversation after 3:40 min). ?- seated B knee extension at [facility] machine, 3x15/15/10 at 15/25/35# ?- seated B hamstring curls at [place], 3x15 at 25/35/35# (45# too heavy) ? ?(Manual therapy - see below) ? ?- seated glue squeeze with B hip abduction against BlueTB around knees, 1x20 with 1 second hold.  ?- seated lumbar flexion ball roll out on clear theraball, 2 min ? ?Manual therapy: to reduce pain and tissue tension, improve range of motion, neuromodulation, in order to promote improved ability to complete  functional activities. ?HOOKLYING ?- LAD through left hip 3x30 seconds  ?  ?Pt required multimodal cuing for proper technique and to facilitate improved neuromuscular control, strength, range of motion, and functional ability resulting in improved performance and form. ?  ? ?PATIENT EDUCATION: ?Education details: Exercise purpose/form. Self management techniques.  ?Person educated: Patient ?Education method: Explanation, Demonstration, Tactile cues, and Verbal cues ?Education comprehension: verbalized understanding, returned demonstration, verbal cues required, tactile cues required, and needs further education ? ? ?HOME EXERCISE PROGRAM: ?Access Code: [email or web address] ?URL: [email or web address] ?Date: 07/06/2021 ?Prepared by: Rosita Kea ? ?Exercises ?- Seated Knee Flexion Stretch  - 3 x daily - 2 sets - 10 reps - 10 seconds hold ?- Seated Long Arc Quad  - 3 x daily - 3 sets - 10 reps - 5 seconds hold ?- Squat with Counter Support  - 1 x daily - 3 sets - 10 reps ?- Standing Knee Flexion Stretch on Step  - 1-3 x daily - 3 sets - 10 reps - 10 seconds hold ?- Forward Step Up with Counter Support  - 1 x daily - 2-3 sets - 10 reps ?- Lying Prone  - 2 x daily - 5 minutes hold ?- Seated Hip Abduction with Resistance  - 1 x daily - 3 sets - 10 reps - 5 seconds hold ?-  Seated Piriformis Stretch  - 1 x daily - 3 sets - 30 seconds hold ?- Seated Slump Nerve Glide  - 2 x daily - 1 sets - 15 reps ? ? PT Short Term Goals   ? ?  ? PT SHORT TERM GOAL #1  ? Title Be independent with initial home exercise program for self-management of symptoms.   ? Baseline Initial HEP provided at IE (04/28/2021);   ? Time 2   ? Period Weeks   ? Status Achieved   ? Target Date 05/12/21   ? ?  ?  ? ?  ? ? ? PT Long Term Goals   ?TARGET DATE FOR ALL LONG TERM GOALS: 07/21/2021. TARGET DATE UPDATED to 09/02/2021 for all unmet/new goals ?  ? PT LONG TERM GOAL #1  ? Title Be independent with a long-term home exercise program for self-management of  symptoms.   ? Baseline Initial HEP provided at IE (04/28/2021); patient reports good participation (05/28/2021); added exercises for low back/L hip (06/10/2021);   ? Time 12   ? Period Weeks   ? Status Partially Met

## 2021-07-28 ENCOUNTER — Ambulatory Visit: Payer: Medicare Other | Admitting: Physical Therapy

## 2021-07-28 ENCOUNTER — Encounter: Payer: Self-pay | Admitting: Physical Therapy

## 2021-07-28 VITALS — BP 113/69 | HR 74

## 2021-07-28 DIAGNOSIS — R262 Difficulty in walking, not elsewhere classified: Secondary | ICD-10-CM

## 2021-07-28 DIAGNOSIS — M6281 Muscle weakness (generalized): Secondary | ICD-10-CM

## 2021-07-28 DIAGNOSIS — M25552 Pain in left hip: Secondary | ICD-10-CM

## 2021-07-28 DIAGNOSIS — G8929 Other chronic pain: Secondary | ICD-10-CM

## 2021-07-28 DIAGNOSIS — M5459 Other low back pain: Secondary | ICD-10-CM

## 2021-07-28 DIAGNOSIS — M25661 Stiffness of right knee, not elsewhere classified: Secondary | ICD-10-CM

## 2021-07-28 DIAGNOSIS — M25561 Pain in right knee: Secondary | ICD-10-CM | POA: Diagnosis not present

## 2021-07-28 NOTE — Therapy (Signed)
?OUTPATIENT PHYSICAL THERAPY TREATMENT NOTE / PROGRESS NOTE ?Dates of reporting: 05/28/2021 through 07/28/2021 ? ? ? ?Patient Name: Ariel Davis ?MRN: [id number] ?DOB:08-05-1965, 56 y.o., female ?Today's Date: 07/28/2021 ? ?PCP: Willaim Rayas, NP ?REFERRING PROVIDER: Merlene Morse, MD ? ? PT End of Session - 07/28/21 1401   ? ? Visit Number 19   ? Number of Visits 24   ? Date for PT Re-Evaluation 09/02/21   ? Authorization Type [employer] MEDICARE reporting period from 05/28/2021   ? Progress Note Due on Visit 20   ? PT Start Time 1355   ? PT Stop Time 1435   ? PT Time Calculation (min) 40 min   ? Activity Tolerance Patient limited by pain   ? Behavior During Therapy [facility] for tasks assessed/performed   ? ?  ?  ? ?  ? ? ? ? ? ? ? ? ?Past Medical History:  ?Diagnosis Date  ? Anxiety   ? Chronic back pain   ? H/O degenerative disc disease   ? Hypertension   ? Sciatica   ? ?Past Surgical History:  ?Procedure Laterality Date  ? breast bioosy    ? CHOLECYSTECTOMY    ? ENDOSCOPIC RETROGRADE CHOLANGIOPANCREATOGRAPHY (ERCP) WITH PROPOFOL N/A 06/17/2021  ? Procedure: ENDOSCOPIC RETROGRADE CHOLANGIOPANCREATOGRAPHY (ERCP) WITH PROPOFOL;  Surgeon: Lucilla Lame, MD;  Location: [facility] ENDOSCOPY;  Service: Endoscopy;  Laterality: N/A;  ? HYSTEROSCOPY    ? INTRAOPERATIVE CHOLANGIOGRAM N/A 06/16/2021  ? Procedure: INTRAOPERATIVE CHOLANGIOGRAM;  Surgeon: Herbert Pun, MD;  Location: [facility] ORS;  Service: General;  Laterality: N/A;  ? TOTAL KNEE ARTHROPLASTY Right 04/09/2021  ? ?Patient Active Problem List  ? Diagnosis Date Noted  ? Obesity, Class III, BMI 40-49.9 (morbid obesity) ([place]) 06/18/2021  ? Acute cholecystitis 06/17/2021  ? Choledocholithiasis   ? Elevated LFTs 06/15/2021  ? Gallstones   ? Jaundice   ? Arthritis of right knee 01/31/2018  ? DDD (degenerative disc disease), lumbar 01/31/2018  ? Pain medication agreement signed 01/31/2018  ? Chronic back pain 01/02/2018  ? Pain in both feet 08/31/2017  ? Knee  pain, bilateral 07/05/2017  ? Depression 06/07/2017  ? OSA (obstructive sleep apnea) 05/12/2017  ? Closed nondisplaced fracture of anterior wall of left acetabulum ([place]) 08/27/2016  ? Localized osteoarthritis of right knee 08/27/2016  ? Closed fracture of posterior wall of left acetabulum ([place]) 08/23/2016  ? Essential hypertension 06/21/2016  ? Allergic rhinitis 12/06/2013  ? Microcytic hypochromic anemia 12/06/2013  ? ? ?REFERRING DIAG: status post right total knee replacement, low back pain, L hip pain ? ?THERAPY DIAG:  ?Chronic pain of right knee ? ?Stiffness of right knee, not elsewhere classified ? ?Muscle weakness (generalized) ? ?Difficulty in walking, not elsewhere classified ? ?Other low back pain ? ?Pain in left hip ? ?Chronic left-sided low back pain with left-sided sciatica ? ?PERTINENT HISTORY: [name] who presents to outpatient physical therapy with a referral for medical diagnosis status post right total knee replacement, low back pain, L hip pain. This patient's chief complaints consist of right knee pain and stiffness following R TKA on 04/09/2021 as well as acute worsening of left low back and hip pain following TKA leading to the following functional deficits: difficulty with mobility including bed mobility, transfers, household and community ambulation, stairs; completing ADLs and IADLs such as cooking and cleaning; makes her unhappy because she "cannot do anything."  Relevant past medical history and comorbidities include chronic back pain (does intermittently go down  both legs), anxiety, sleep apnea, left hip fracture (2018 still bothers her, non surgical), bilateral foot pain, microcytic hypochromatic anemia, bilateral knee pain, obesity.  Patient denies hx of cancer, stroke, seizures, lung problem, major cardiac events, diabetes, unexplained weight loss, changes in bowel or bladder problems, new onset stumbling or dropping things, spinal surgery, left hip surgery. she was  hospitalized on 06/15/2021.  She had a robotic cholecystectomy done by Dr. Windell Moment on 06/16/2021.  The patient had an ERCP on 06/17/2021 which complete removal of choledocholithiasis was done by biliary sphincterotomy and balloon extraction. She was discharged home on 06/18/2021. She was advised on "no heavy lifting >20 pounds (children, pets, laundry, garbage) or strenuous activity until follow-up, but light activity and walking are encouraged" upon discharge ? ?PRECAUTIONS: Plan from MD note 07/02/2021 for laproscopic surgery follow up: "Increase activity as tolerated." ? ?SUBJECTIVE: Patient reports her left hip/glute is really hurting today. She states it has been like this for the last 2-3 days. She states this pain is 8/10 and especially bad when she tries to step up the stairs with her left foot leading. She state she felt okay after her last PT session. She states she is out of celebrex as of yesterday. She finds if she misses a couple of days of celebrex then her pain get severe. She did miss a couple of days this weekend and that is when her pain got better. Right knee is doing pretty good. She had no soreness in R knee after last PT session. HEP has been going well. Patient states she has not noticed any real lasting improvement in her left hip/glute since PT has focused more on this region and she does not think PT is helping there. She states her R knee hurts at night but otherwise is feeling good and she is confident in her recovery from R TKA.  ? ?PAIN:  ?Are you having pain? Yes 8/10 in the left hip/glute ? ?OBJECTIVE ?  ?Vitals:  ? 07/28/21 1402  ?BP: 113/69  ?Pulse: 74  ?SpO2: 100%  ?Seated right arm, adult large automatic cuff ?1st measurement prior to exercise ? ? ?TODAY'S TREATMENT:  ?Therapeutic exercise: to centralize symptoms and improve ROM, strength, muscular endurance, and activity tolerance required for successful completion of functional activities.  ?- vitals measurement for safety (see  above) ?- NuStep level 6 using bilateral lower extremities. Seat setting 11. For improved extremity mobility, muscular endurance, and activity tolerance; and to induce the analgesic effect of aerobic exercise, stimulate improved joint nutrition, and prepare body structures and systems for following interventions. x 5:49 minutes. Average SPM = 71 (less hip pain, left knee pain).  ? ?Superset:  ?- seated B knee extension at [facility] machine, 3x15/10/10 at 35/45/45# ?- seated B hamstring curls at [place], 3x15 at 35# (complains of left hip/glute pain starting with 2nd set).  ? ?(Manual therapy - see below) ? ?- seated glue squeeze with B hip abduction against Blue[employer] around knees, 1x20 with 1 second hold.  ?- seated lumbar flexion ball roll out on clear theraball, 2 min ? ?Manual therapy: to reduce pain and tissue tension, improve range of motion, neuromodulation, in order to promote improved ability to complete functional activities. ?HOOKLYING ?- LAD through left hip 3x30 seconds  ?  ?Pt required multimodal cuing for proper technique and to facilitate improved neuromuscular control, strength, range of motion, and functional ability resulting in improved performance and form. ?  ? ?PATIENT EDUCATION: ?Education details: Exercise purpose/form. Self management  techniques. Importance of being on time to get full benefit of PT sessions. POC, recommendation to return to referring physician for further advice on left hip/glute pain due to lack of improvement with PT so far.  ?Person educated: Patient ?Education method: Explanation, Demonstration, Tactile cues, and Verbal cues ?Education comprehension: verbalized understanding, returned demonstration, verbal cues required, tactile cues required, and needs further education ? ? ?HOME EXERCISE PROGRAM: ?Access Code: [email or web address] ?URL: [email or web address] ?Date: 07/06/2021 ?Prepared by: Rosita Kea ? ?Exercises ?- Seated Knee Flexion Stretch  - 3 x daily - 2 sets  - 10 reps - 10 seconds hold ?- Seated Long Arc Quad  - 3 x daily - 3 sets - 10 reps - 5 seconds hold ?- Squat with Counter Support  - 1 x daily - 3 sets - 10 reps ?- Standing Knee Flexion Stretch on Step

## 2021-07-30 ENCOUNTER — Encounter: Payer: Medicare Other | Admitting: Physical Therapy

## 2021-12-28 ENCOUNTER — Other Ambulatory Visit: Payer: Self-pay

## 2021-12-28 ENCOUNTER — Emergency Department: Payer: Medicare Other

## 2021-12-28 ENCOUNTER — Emergency Department
Admission: EM | Admit: 2021-12-28 | Discharge: 2021-12-28 | Disposition: A | Discharge status: Home / Self Care | Payer: Medicare Other | Attending: Emergency Medicine | Admitting: Emergency Medicine

## 2021-12-28 DIAGNOSIS — M5137 Other intervertebral disc degeneration, lumbosacral region: Secondary | ICD-10-CM | POA: Insufficient documentation

## 2021-12-28 DIAGNOSIS — M545 Low back pain, unspecified: Secondary | ICD-10-CM | POA: Diagnosis present

## 2021-12-28 DIAGNOSIS — M5416 Radiculopathy, lumbar region: Secondary | ICD-10-CM | POA: Insufficient documentation

## 2021-12-28 MED ORDER — KETOROLAC TROMETHAMINE 60 MG/2ML IM SOLN
60.0000 mg | Freq: Once | INTRAMUSCULAR | Status: AC
  Administered 2021-12-28: 60 mg via INTRAMUSCULAR
  Filled 2021-12-28: qty 2

## 2021-12-28 MED ORDER — TIZANIDINE HCL 2 MG PO TABS
4.0000 mg | ORAL_TABLET | Freq: Once | ORAL | Status: AC
Start: 2021-12-28 — End: 2021-12-28
  Administered 2021-12-28: 4 mg via ORAL
  Filled 2021-12-28: qty 2

## 2021-12-28 MED ORDER — PREDNISONE 20 MG PO TABS: 40.0000 mg | ORAL_TABLET | Freq: Every day | ORAL | 0 refills | Status: AC

## 2021-12-28 MED ORDER — PREGABALIN 100 MG PO CAPS: 200.0000 mg | ORAL_CAPSULE | Freq: Every day | ORAL | 0 refills | Status: AC

## 2021-12-28 MED ORDER — PREGABALIN 50 MG PO CAPS
200.0000 mg | ORAL_CAPSULE | Freq: Once | ORAL | Status: AC
  Administered 2021-12-28: 200 mg via ORAL
  Filled 2021-12-28: qty 4

## 2021-12-28 MED ORDER — TIZANIDINE HCL 2 MG PO TABS
2.0000 mg | ORAL_TABLET | Freq: Three times a day (TID) | ORAL | 0 refills | Status: AC
Start: 2021-12-28 — End: 2022-01-07

## 2021-12-28 NOTE — ED Provider Notes (Signed)
[facility] Emergency Department Provider Note     Event Date/Time   First MD Initiated Contact with Patient 12/28/21 1923     (approximate)   History   Leg Pain and Back Pain   HPI  Ariel Davis is a 56 y.o. female with a history of low back pain and sciatica, Ariel Davis presents to the ED for ongoing symptoms.  Patient denies any recent injury, trauma, or fall but she reports onset of acute symptoms about 2 days prior.  She does report she had a recent MRI performed, but has not had follow-up related to the results and or ongoing treatment plan.  She denies any bladder or bowel incontinence, foot drop, or saddle anesthesia.  Physical Exam   Triage Vital Signs: ED Triage Vitals [12/28/21 1743]  Enc Vitals Group     BP (!) 135/91     Pulse Rate 70     Resp 17     Temp 98.1 F (36.7 C)     Temp Source Oral     SpO2 99 %     Weight      Height      Head Circumference      Peak Flow      Pain Score 9     Pain Loc      Pain Edu?      Excl. in [place]?     Most recent vital signs: Vitals:   12/28/21 1743  BP: (!) 135/91  Pulse: 70  Resp: 17  Temp: 98.1 F (36.7 C)  SpO2: 99%    General Awake, no distress. NAD CV:  Good peripheral perfusion.  RESP:  Normal effort. CTA ABD:  No distention.  MSK:  Normal spinal alignment without midline tenderness, spasm, deformity, or step-off.  Patient with left lower extremity symptoms on exam.  Full active range of motion is appreciated. NEURO: Cranial nerves II through XII grossly intact.  Positive supine straight leg raise on the left.  Normal LE DTRs bilaterally.   ED Results / Procedures / Treatments   Labs (all labs ordered are listed, but only abnormal results are displayed) Labs Reviewed - No data to display   EKG   RADIOLOGY  I personally viewed and evaluated these images as part of my medical decision making, as well as reviewing the written report by the radiologist.  ED Provider  Interpretation: no acute findings  DG Lumbar Spine 2-3 Views  Result Date: 12/28/2021 CLINICAL DATA:  Back pain radiating to leg. EXAM: LUMBAR SPINE - 2-3 VIEW COMPARISON:  December 16, 2019 FINDINGS: Curvature lumbar spine, apex to the left. 7 mm anterolisthesis of L4 versus L5 today versus 4 mm on the previous study. 5 mm anterolisthesis of L3 versus L4 today versus 2 mm on the previous study. No other malalignment. No fractures. Lumbar facet degenerative changes identified. No other acute abnormalities. IMPRESSION: Increasing grade 1 anterolisthesis of L3 versus L4 and L4 versus L5. Facet degenerative changes. No other abnormalities. Electronically Signed   By: Dorise Bullion III M.D.   On: 12/28/2021 18:19    MRI Lumbar Spine [facility] 12/01/21)  Impression  Moderate to marked multilevel degenerative changes, overall progressed from prior, most prominently:  1. Mildly progressed severe spinal canal narrowing at L3-L4, with similar to prior moderate to severe spinal canal narrowing at L4-L5.  2. Neural foraminal narrowing, mildly progressed and including moderate to severe right and mild left L3-L4, similar to prior moderate bilateral L4-L5, and  aggressive moderate left and mild to moderate right neural foraminal narrowing at L5-S1.  3. New/enlarging right subarticular disc protrusion at L5-S1 contacting the descending right S1 nerve root.  PROCEDURES:  Critical Care performed: No  Procedures   MEDICATIONS ORDERED IN ED: Medications  ketorolac (TORADOL) injection 60 mg (has no administration in time range)  tiZANidine (ZANAFLEX) tablet 4 mg (has no administration in time range)  pregabalin (LYRICA) capsule 200 mg (has no administration in time range)     IMPRESSION / MDM / ASSESSMENT AND PLAN / ED COURSE  I reviewed the triage vital signs and the nursing notes.                              Differential diagnosis includes, but is not limited to, radiculopathy, HNP, sciatica, spinal  cord compression,  Patient's presentation is most consistent with acute complicated illness / injury requiring diagnostic workup.  Patient's diagnosis is consistent with left lower extremity sciatic irritation with MRI evidence of moderate to severe foraminal narrowing and multilevel DDD.  No red flags on exam.  Patient reports improvement of her symptoms after ED medication administration. Patient will be discharged home with prescriptions for prednisone, and increased dose of her pregabalin, and tizanidine. Patient is to follow up with her Ortho specialist as scheduled, as needed or otherwise directed. Patient is given ED precautions to return to the ED for any worsening or new symptoms.     FINAL CLINICAL IMPRESSION(S) / ED DIAGNOSES   Final diagnoses:  Acute left lumbar radiculopathy  DDD (degenerative disc disease), lumbosacral     Rx / DC Orders   ED Discharge Orders     None        Note:  This document was prepared using Dragon voice recognition software and may include unintentional dictation errors.    Lissa Hoard, PA-C 12/29/21 2336    Chesley Noon, MD 01/06/22 [phone]

## 2021-12-28 NOTE — Discharge Instructions (Addendum)
Your exam and recent MRI confirm DDD and nerve impingement. Take your Pregabalin 50 mg AM & Midday, then take the new Pregabalin RX 200 mg tablet at bedtime. Take either your Baclofen or the new RX Tizanidine up to 3 times a day for muscle spasms. Take the steroid daily as directed. Follow-up with your spine specialist or PCP for ongoing care. Return to the ED as needed.

## 2021-12-28 NOTE — ED Triage Notes (Signed)
Pt presents to ED with /co of lower back pain that radiates down L leg, pt states HX of sciatica. Pt denies any recent injury or trauma. Pain started 2 days ago.

## 2021-12-28 NOTE — ED Provider Triage Note (Signed)
Emergency Medicine Provider Triage Evaluation Note  Ariel Davis , a 56 y.o. female  was evaluated in triage.  Pt complains of low back pain radiating down the leg.  Patient falls history of chronic low back pain with radicular symptoms.  Worsening pain over the last 2 days.  No bowel or bladder function, saddle anesthesia or paresthesias.  No recent trauma.  No urinary or GI complaints..  Review of Systems  Positive: Low back pain with radiation down the left leg Negative: , Bladder stricture, saddle anesthesia or paresthesias  Physical Exam  BP (!) 135/91 (BP Location: Right Arm)   Pulse 70   Temp 98.1 F (36.7 C) (Oral)   Resp 17   LMP 12/16/2014   SpO2 99%  Gen:   Awake, no distress   Resp:  Normal effort  MSK:   Moves extremities without difficulty  Other:    Medical Decision Making  Medically screening exam initiated at 5:52 PM.  Appropriate orders placed.  Ariel Davis was informed that the remainder of the evaluation will be completed by another provider, this initial triage assessment does not replace that evaluation, and the importance of remaining in the ED until their evaluation is complete.  Patient with low back pain radiating down the left leg.  No recent trauma.  No urinary or GI complaints.  History of same.  Will have x-ray.   Ariel Moll, PA-C 12/28/21 1752

## 2022-09-20 ENCOUNTER — Emergency Department
Admission: EM | Admit: 2022-09-20 | Discharge: 2022-09-20 | Disposition: A | Discharge status: Home / Self Care | Payer: 59 | Attending: Emergency Medicine | Admitting: Emergency Medicine

## 2022-09-20 ENCOUNTER — Other Ambulatory Visit: Payer: Self-pay

## 2022-09-20 DIAGNOSIS — G8929 Other chronic pain: Secondary | ICD-10-CM

## 2022-09-20 DIAGNOSIS — M5441 Lumbago with sciatica, right side: Secondary | ICD-10-CM | POA: Diagnosis not present

## 2022-09-20 DIAGNOSIS — I10 Essential (primary) hypertension: Secondary | ICD-10-CM | POA: Diagnosis not present

## 2022-09-20 DIAGNOSIS — M545 Low back pain, unspecified: Secondary | ICD-10-CM | POA: Diagnosis present

## 2022-09-20 DIAGNOSIS — M5416 Radiculopathy, lumbar region: Secondary | ICD-10-CM | POA: Insufficient documentation

## 2022-09-20 DIAGNOSIS — M5442 Lumbago with sciatica, left side: Secondary | ICD-10-CM | POA: Insufficient documentation

## 2022-09-20 MED ORDER — KETOROLAC TROMETHAMINE 30 MG/ML IJ SOLN
30.0000 mg | Freq: Once | INTRAMUSCULAR | Status: AC
Start: 2022-09-20 — End: 2022-09-20
  Administered 2022-09-20: 30 mg via INTRAMUSCULAR
  Filled 2022-09-20: qty 1

## 2022-09-20 MED ORDER — CELECOXIB 100 MG PO CAPS: 100.0000 mg | ORAL_CAPSULE | Freq: Two times a day (BID) | ORAL | 0 refills | Status: AC

## 2022-09-20 MED ORDER — LIDOCAINE 5 % EX PTCH: 1.0000 | MEDICATED_PATCH | Freq: Two times a day (BID) | CUTANEOUS | 0 refills | Status: AC

## 2022-09-20 MED ORDER — LIDOCAINE 5 % EX PTCH
1.0000 | MEDICATED_PATCH | CUTANEOUS | Status: DC
  Administered 2022-09-20: 1 via TRANSDERMAL
  Filled 2022-09-20: qty 1

## 2022-09-20 NOTE — ED Triage Notes (Signed)
Pt presents to ED with c/o of lower back pain and bilateral leg pain, pt states HX of sciatica. Pt states this is chronic pain but pain meds aren't working. NAD noted.   Pt arrives with bojangles drink, pt educated not to eat or drink .

## 2022-09-20 NOTE — ED Provider Notes (Signed)
[facility] Provider Note    Event Date/Time   First MD Initiated Contact with Patient 09/20/22 [phone]     (approximate)   History   Chief Complaint Back Pain   HPI  Ariel Davis is a 57 y.o. female with past medical history of hypertension and chronic back pain who presents to the ED complaining of back pain.  Patient reports that she has had a "flareup" of pain in both sides of her lower back over the past 2 days.  She denies any falls or other injuries to her back.  She describes pain radiating down both of her legs with some associated numbness, which is typical for her.  She has not noticed any weakness in her legs and denies any numbness in her groin or incontinence.  She states she has been taking baclofen at home without significant relief.  She denies taking any other medication for her back.  She currently follows with [facility] pain management for back issues, states she had a procedure on her back a few months ago that seem to be helping up until recently.      Physical Exam   Triage Vital Signs: ED Triage Vitals  Enc Vitals Group     BP 09/20/22 0842 (!) 153/100     Pulse Rate 09/20/22 0842 75     Resp 09/20/22 0842 17     Temp 09/20/22 0842 98.3 F (36.8 C)     Temp Source 09/20/22 0842 Oral     SpO2 09/20/22 0842 100 %     Weight 09/20/22 0854 248 lb 14.4 oz (112.9 kg)     Height 09/20/22 0854 5\' 6"  (1.676 m)     Head Circumference --      Peak Flow --      Pain Score 09/20/22 0842 10     Pain Loc --      Pain Edu? --      Excl. in [place]? --     Most recent vital signs: Vitals:   09/20/22 0842  BP: (!) 153/100  Pulse: 75  Resp: 17  Temp: 98.3 F (36.8 C)  SpO2: 100%    Constitutional: Alert and oriented. Eyes: Conjunctivae are normal. Head: Atraumatic. Nose: No congestion/rhinnorhea. Mouth/Throat: Mucous membranes are moist.  Cardiovascular: Normal rate, regular rhythm. Grossly normal heart sounds.  2+ radial and DP pulses  bilaterally. Respiratory: Normal respiratory effort.  No retractions. Lungs CTAB. Gastrointestinal: Soft and nontender. No distention. Musculoskeletal: Bilateral lumbar paraspinal tenderness to palpation noted.  No lower extremity tenderness nor edema.  Neurologic:  Normal speech and language. No gross focal neurologic deficits are appreciated.    ED Results / Procedures / Treatments   Labs (all labs ordered are listed, but only abnormal results are displayed) Labs Reviewed - No data to display   PROCEDURES:  Critical Care performed: No  Procedures   MEDICATIONS ORDERED IN ED: Medications  lidocaine (LIDODERM) 5 % 1 patch (1 patch Transdermal Patch Applied 09/20/22 0921)  ketorolac (TORADOL) 30 MG/ML injection 30 mg (30 mg Intramuscular Given 09/20/22 0919)     IMPRESSION / MDM / ASSESSMENT AND PLAN / ED COURSE  I reviewed the triage vital signs and the nursing notes.                              57 y.o. female with past medical history of hypertension and chronic back pain who presents to  the ED complaining of increasing pain in both sides of her lower back radiating down both legs.  Patient's presentation is most consistent with acute, uncomplicated illness.  Differential diagnosis includes, but is not limited to, lumbar radiculopathy, lumbar strain, cauda equina, chronic back pain.  Patient nontoxic-appearing and in no acute distress, vital signs are unremarkable.  She is neurovascular intact to her bilateral lower extremities with no findings concerning for cauda equina.  She appears to have an exacerbation of her known chronic back pain with features of sciatica.  Given no recent trauma and no concerning findings on exam, do not feel imaging warranted at this time.  Patient had improvement in pain following Lidoderm patch and IM Toradol.  It appears she was previously on celecoxib for her back but is not taking this recently, we will restart this medication and also  prescribe Lidoderm patches.  She was counseled to follow-up with her pain management provider and to return to the ED for new or worsening symptoms, patient agrees with plan.      FINAL CLINICAL IMPRESSION(S) / ED DIAGNOSES   Final diagnoses:  Lumbar radiculopathy  Chronic bilateral low back pain with bilateral sciatica     Rx / DC Orders   ED Discharge Orders          Ordered    celecoxib (CELEBREX) 100 MG capsule  2 times daily        09/20/22 1026    lidocaine (LIDODERM) 5 %  Every 12 hours        09/20/22 1026             Note:  This document was prepared using Dragon voice recognition software and may include unintentional dictation errors.   Chesley Noon, MD 09/20/22 1032

## 2022-09-20 NOTE — ED Notes (Signed)
See triage note  Presents with lower back pain States has a hx of same  Is currently on meds for same  but feels like the meds are not working   Denies any new injury

## 2023-04-06 DIAGNOSIS — S86311A Strain of muscle(s) and tendon(s) of peroneal muscle group at lower leg level, right leg, initial encounter: Secondary | ICD-10-CM

## 2023-04-06 HISTORY — DX: Strain of muscle(s) and tendon(s) of peroneal muscle group at lower leg level, right leg, initial encounter: S86.311A
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
## 2023-04-18 ENCOUNTER — Emergency Department
Admission: EM | Admit: 2023-04-18 | Discharge: 2023-04-18 | Disposition: A | Discharge status: Home / Self Care | Payer: 59 | Attending: Emergency Medicine | Admitting: Emergency Medicine

## 2023-04-18 ENCOUNTER — Other Ambulatory Visit: Payer: Self-pay

## 2023-04-18 DIAGNOSIS — M5442 Lumbago with sciatica, left side: Secondary | ICD-10-CM | POA: Insufficient documentation

## 2023-04-18 DIAGNOSIS — M549 Dorsalgia, unspecified: Secondary | ICD-10-CM | POA: Diagnosis present

## 2023-04-18 DIAGNOSIS — M5441 Lumbago with sciatica, right side: Secondary | ICD-10-CM | POA: Diagnosis not present

## 2023-04-18 DIAGNOSIS — G8929 Other chronic pain: Secondary | ICD-10-CM

## 2023-04-18 DIAGNOSIS — I10 Essential (primary) hypertension: Secondary | ICD-10-CM | POA: Diagnosis not present

## 2023-04-18 MED ORDER — DEXAMETHASONE SODIUM PHOSPHATE 10 MG/ML IJ SOLN
10.0000 mg | Freq: Once | INTRAMUSCULAR | Status: AC
  Administered 2023-04-18: 10 mg via INTRAMUSCULAR
  Filled 2023-04-18: qty 1

## 2023-04-18 MED ORDER — HYDROCODONE-ACETAMINOPHEN 5-325 MG PO TABS: 1.0000 | ORAL_TABLET | ORAL | 0 refills | Status: DC | PRN

## 2023-04-18 MED ORDER — KETOROLAC TROMETHAMINE 15 MG/ML IJ SOLN
15.0000 mg | Freq: Once | INTRAMUSCULAR | Status: AC
  Administered 2023-04-18: 15 mg via INTRAMUSCULAR
  Filled 2023-04-18: qty 1

## 2023-04-18 MED ORDER — PREDNISONE 50 MG PO TABS: ORAL_TABLET | ORAL | 0 refills | Status: AC

## 2023-04-18 NOTE — Discharge Instructions (Addendum)
 You can take the Norco every 4 hours as needed for pain.  Keep in mind that this may make you feel itchy, I recommend taking Benadryl if you experience this.  It is also sedating so you cannot drive after taking it. Please take the prednisone  once a day for 5 days.  Wait 24 hours before taking any NSAIDs like ibuprofen , naproxen, celecoxib . You can continue to take the tylenol .   Do not take the buprenophine while taking the norco as it will decrease the pain relieving effects of it.   Please follow up with the pain management center at [facility].

## 2023-04-18 NOTE — ED Notes (Signed)
 See triage notes. Patient c/o back pain for the past three days. Patient has a hx of chronic back pain. Denies urinary symptoms.

## 2023-04-18 NOTE — ED Triage Notes (Signed)
 Pt to ED for back pain x3 days. Reports has chronic back pain. Denies urinary sx.

## 2023-04-18 NOTE — ED Provider Notes (Signed)
 [facility] Provider Note    Event Date/Time   First MD Initiated Contact with Patient 04/18/23 [phone]     (approximate)   History   Back Pain   HPI   Ariel Davis is a 58 y.o. female with PMH of HTN, anxiety, sciatica and chronic back pain presents for evaluation of back pain.  Patient is followed by [facility] pain management.  She reports that she has had worsening of her symptoms over the past couple days.  She feels like this is because she is due for more back injections.  She denies changes in bladder and bowel function, no saddle anesthesia, but does feel like her legs are weak due to the pain.   Review of patient's chart shows that she has significant spinal pathology including multilevel DDD worst at L4-5 resulting in severe canal stenosis and mild to moderate foraminal narrowing as well as large facet effusions at L3-4 possibly degenerative as noted from lumbar MRI in 2017 and 2019.      Physical Exam   Triage Vital Signs: ED Triage Vitals  Encounter Vitals Group     BP 04/18/23 0810 (!) 132/95     Systolic BP Percentile --      Diastolic BP Percentile --      Pulse Rate 04/18/23 0809 86     Resp 04/18/23 0809 18     Temp 04/18/23 0809 98.6 F (37 C)     Temp src --      SpO2 04/18/23 0809 97 %     Weight 04/18/23 0809 212 lb (96.2 kg)     Height 04/18/23 0809 5' 8 (1.727 m)     Head Circumference --      Peak Flow --      Pain Score 04/18/23 0809 10     Pain Loc --      Pain Education --      Exclude from Growth Chart --     Most recent vital signs: Vitals:   04/18/23 0809 04/18/23 0810  BP:  (!) 132/95  Pulse: 86   Resp: 18   Temp: 98.6 F (37 C)   SpO2: 97%     General: Awake, no distress.  CV:  Good peripheral perfusion.  RRR. Resp:  Normal effort.  CTAB. Abd:  No distention.  Other:  Very tender to palpation all along the lower back, posterior tibialis pulses are 2+ and regular bilaterally, decreased sensation along  the lateral right ankle otherwise sensation intact across the rest of the dermatomes.  4/5 strength in the right lower extremity when compared to the left, any movement worsens pain.  ED Results / Procedures / Treatments   Labs (all labs ordered are listed, but only abnormal results are displayed) Labs Reviewed - No data to display   PROCEDURES:  Critical Care performed: No  Procedures   MEDICATIONS ORDERED IN ED: Medications  dexamethasone  (DECADRON ) injection 10 mg (has no administration in time range)  ketorolac  (TORADOL ) 15 MG/ML injection 15 mg (has no administration in time range)     IMPRESSION / MDM / ASSESSMENT AND PLAN / ED COURSE  I reviewed the triage vital signs and the nursing notes.                             58 year old female presents for evaluation of back pain with a history of chronic back pain.  Patient was hypertensive in triage  otherwise vital signs are stable.  Patient in moderate distress on exam due to her pain.  Differential diagnosis includes, but is not limited to, chronic back pain, muscle strain, lumbar radiculopathy, cauda equina syndrome.  Patient's presentation is most consistent with exacerbation of chronic illness.  I do not suspect cauda equina at this time as patient does not have saddle anesthesia, and there is been no change in bladder or bowel function.  I do not feel that repeat imaging is warranted as patient has not had any new injuries and does not have worsening weakness or sensation changes from her normal.  Sounds like patient may be due for more intra-articular steroid injections.  She has an appointment scheduled with the pain clinic for later this month which I advised her to keep.  In the meantime we will try to control her pain acutely.  She was given Toradol  and dexamethasone  while in the ED.  I will also send her home with a few days of steroids.  It sounds like she may have needed opioid pain medications in the past to  control her pain but has not been on them recently.  I offered a couple days to help with this acute exacerbation of her pain which she was agreeable to.  All questions were answered and she was stable at discharge.     FINAL CLINICAL IMPRESSION(S) / ED DIAGNOSES   Final diagnoses:  Chronic bilateral low back pain with bilateral sciatica     Rx / DC Orders   ED Discharge Orders          Ordered    HYDROcodone -acetaminophen  (NORCO/VICODIN) 5-325 MG tablet  Every 4 hours PRN        04/18/23 0952    predniSONE  (DELTASONE ) 50 MG tablet        04/18/23 9047             Note:  This document was prepared using Dragon voice recognition software and may include unintentional dictation errors.   Cleaster Tinnie LABOR, PA-C 04/18/23 0957    Levander Slate, MD 04/19/23 [phone]

## 2023-05-31 ENCOUNTER — Other Ambulatory Visit: Payer: Self-pay | Admitting: Podiatry

## 2023-05-31 DIAGNOSIS — M7671 Peroneal tendinitis, right leg: Secondary | ICD-10-CM

## 2023-05-31 DIAGNOSIS — M79671 Pain in right foot: Secondary | ICD-10-CM

## 2023-05-31 DIAGNOSIS — S86311A Strain of muscle(s) and tendon(s) of peroneal muscle group at lower leg level, right leg, initial encounter: Secondary | ICD-10-CM

## 2023-06-16 ENCOUNTER — Ambulatory Visit
Admission: RE | Admit: 2023-06-16 | Discharge: 2023-06-16 | Disposition: A | Discharge status: Home / Self Care | Payer: 59 | Source: Ambulatory Visit | Attending: Podiatry | Admitting: Podiatry

## 2023-06-16 DIAGNOSIS — M7671 Peroneal tendinitis, right leg: Secondary | ICD-10-CM

## 2023-06-16 DIAGNOSIS — S86311A Strain of muscle(s) and tendon(s) of peroneal muscle group at lower leg level, right leg, initial encounter: Secondary | ICD-10-CM

## 2023-06-16 DIAGNOSIS — M79671 Pain in right foot: Secondary | ICD-10-CM

## 2023-07-01 ENCOUNTER — Emergency Department
Admission: EM | Admit: 2023-07-01 | Discharge: 2023-07-01 | Disposition: A | Discharge status: Home / Self Care | Attending: Emergency Medicine | Admitting: Emergency Medicine

## 2023-07-01 ENCOUNTER — Other Ambulatory Visit: Payer: Self-pay

## 2023-07-01 DIAGNOSIS — G8929 Other chronic pain: Secondary | ICD-10-CM | POA: Diagnosis not present

## 2023-07-01 DIAGNOSIS — Z79899 Other long term (current) drug therapy: Secondary | ICD-10-CM | POA: Insufficient documentation

## 2023-07-01 DIAGNOSIS — M79604 Pain in right leg: Secondary | ICD-10-CM | POA: Diagnosis not present

## 2023-07-01 DIAGNOSIS — M545 Low back pain, unspecified: Secondary | ICD-10-CM | POA: Diagnosis present

## 2023-07-01 MED ORDER — CYCLOBENZAPRINE HCL 10 MG PO TABS
5.0000 mg | ORAL_TABLET | Freq: Once | ORAL | Status: AC
  Administered 2023-07-01: 5 mg via ORAL
  Filled 2023-07-01: qty 1

## 2023-07-01 MED ORDER — CYCLOBENZAPRINE HCL 5 MG PO TABS: 5.0000 mg | ORAL_TABLET | Freq: Three times a day (TID) | ORAL | 0 refills | Status: AC | PRN

## 2023-07-01 MED ORDER — HYDROCODONE-ACETAMINOPHEN 5-325 MG PO TABS
1.0000 | ORAL_TABLET | Freq: Once | ORAL | Status: AC
  Administered 2023-07-01: 1 via ORAL
  Filled 2023-07-01: qty 1

## 2023-07-01 NOTE — ED Provider Notes (Signed)
 [place] EMERGENCY DEPARTMENT AT [facility] Provider Note   CSN: [id number] Arrival date & time: 07/01/23  1027     History  Chief Complaint  Patient presents with   Back Pain    Ariel Davis is a 58 y.o. female.  Patient here for back pain.  She notes low back pain, right leg pain.  Unsure exactly if it is her back or her foot.  Does have history of degenerative disc disease and sciatica followed by [facility] pain clinic on Celebrex.  Also with a recent diagnosis of tendinopathy of right foot followed by podiatry for planned upcoming procedure.  Has not been using crutches or wearing walking boot as directed.  Drove herself here today thus could not wear the boot.  Denies numbness, weakness, bowel or bladder dysfunction.  No abdominal pain or dysuria. no new injury   Back Pain Associated symptoms: no abdominal pain, no chest pain, no fever, no numbness and no weakness        Home Medications Prior to Admission medications   Medication Sig Start Date End Date Taking? Authorizing Provider  cyclobenzaprine (FLEXERIL) 5 MG tablet Take 1 tablet (5 mg total) by mouth 3 (three) times daily as needed for muscle spasms. 07/01/23  Yes Christen Bame, PA-C  amLODipine (NORVASC) 5 MG tablet Take 5 mg by mouth daily. 07/12/16   [provider]  Buprenorphine HCl 300 MCG FILM Place 1 strip under the tongue 2 (two) times daily. 12/25/18   [provider]  hydrOXYzine (ATARAX/VISTARIL) 25 MG tablet Can take 1-2 tablets 3 times a day as needed for anxiety 01/02/18   [provider]  lidocaine (LIDODERM) 5 % Place 1 patch onto the skin every 12 (twelve) hours. Remove & Discard patch within 12 hours or as directed by MD 09/20/22 09/20/23  Chesley Noon, MD  omeprazole (PRILOSEC) 20 MG capsule Take 20 mg by mouth daily. 03/19/18 06/15/21  [provider]  predniSONE (DELTASONE) 50 MG tablet Take one tablet per day for 5 days. 04/18/23   Cameron Ali, PA-C   pregabalin (LYRICA) 100 MG capsule Take 2 capsules (200 mg total) by mouth at bedtime for 20 days. 12/28/21 01/17/22  Menshew, Charlesetta Ivory, PA-C  pregabalin (LYRICA) 50 MG capsule Take 50 mg by mouth 3 (three) times daily. 12/22/20   [provider]  rOPINIRole (REQUIP) 0.5 MG tablet Take 1 tablet (0.5 mg total) by mouth at bedtime. 05/17/17   Fisher, Roselyn Bering, PA-C  topiramate (TOPAMAX) 50 MG tablet Take 50 mg by mouth at bedtime. 08/01/18   [provider]  venlafaxine XR (EFFEXOR XR) 37.5 MG 24 hr capsule Take 1 capsule (37.5 mg total) by mouth daily. 10/16/20 10/16/21  Nita Sickle, MD  VICTOZA 18 MG/3ML SOPN 1.2 mg daily. 06/06/21   [provider]  Vitamin D, Ergocalciferol, (DRISDOL) 1.25 MG (50000 UT) CAPS capsule Take 50,000 Units by mouth once a week. 08/23/18   [provider]      Allergies    Amoxicillin, Duloxetine, Hydrocodone, and Oxycodone    Review of Systems   Review of Systems  Constitutional:  Negative for chills and fever.  Respiratory:  Negative for cough.   Cardiovascular:  Negative for chest pain.  Gastrointestinal:  Negative for abdominal pain.  Musculoskeletal:  Positive for arthralgias and back pain.  Neurological:  Negative for weakness and numbness.    Physical Exam Updated Vital Signs BP (!) 152/87   Pulse 72   Temp  98.1 F (36.7 C) (Oral)   Resp 16   Wt 96.2 kg   LMP 12/16/2014   SpO2 99%   BMI 32.25 kg/m  Physical Exam Vitals reviewed.  Constitutional:      General: She is not in acute distress.    Appearance: Normal appearance. She is not ill-appearing or toxic-appearing.  HENT:     Head: Normocephalic and atraumatic.     Nose: Nose normal.  Cardiovascular:     Pulses: Normal pulses.  Pulmonary:     Effort: Pulmonary effort is normal.  Abdominal:     Tenderness: There is no abdominal tenderness. There is no right CVA tenderness.  Musculoskeletal:     Cervical back: Normal range of motion.      Comments: No lumbar bony tenderness.  Lower extremity sensation equal bilateral.  Dorsal and plantarflexion intact strength 5/5.  Equal bilateral patellar reflexes, 2+ bilateral dorsal pedal pulse.  Neurological:     Mental Status: She is alert and oriented to person, place, and time. Mental status is at baseline.  Psychiatric:        Mood and Affect: Mood normal.        Behavior: Behavior normal.     ED Results / Procedures / Treatments   Labs (all labs ordered are listed, but only abnormal results are displayed) Labs Reviewed - No data to display  EKG None  Radiology No results found.  Procedures Procedures    Medications Ordered in ED Medications  HYDROcodone-acetaminophen (NORCO/VICODIN) 5-325 MG per tablet 1 tablet (has no administration in time range)  cyclobenzaprine (FLEXERIL) tablet 5 mg (has no administration in time range)    ED Course/ Medical Decision Making/ A&P                                 Medical Decision Making Patient here for low back pain with history of similar no new injury.  No numbness weakness or bowel or bladder dysfunction.  Has no abdominal pain or tenderness no flank pain or dysuria no bony tenderness and no CVA tenderness hemodynamically stable afebrile overall well-appearing.  Physical exam is reassuring without any red flag symptoms.  Patient does have known right foot tendinopathy, plan for podiatry follow-up April 3.  Do not suspect fracture or cauda equina.  Does appear to be consistent with her low back pain likely exacerbated by walking boot and crutches due to her foot injury/plantar fasciitis. She is already on Celebrex and acetaminophen.  Will add Flexeril and provide pain relief right now.  Recommend close follow-up with her pain management specialist.  Given return precautions here for new or worse symptoms such as red flag symptoms listed above.  Risk Prescription drug management.           Final Clinical Impression(s) /  ED Diagnoses Final diagnoses:  Chronic low back pain, unspecified back pain laterality, unspecified whether sciatica present    Rx / DC Orders ED Discharge Orders          Ordered    cyclobenzaprine (FLEXERIL) 5 MG tablet  3 times daily PRN        07/01/23 1118              Christen Bame, PA-C 07/01/23 1119    Janith Lima, MD 07/01/23 1359

## 2023-07-01 NOTE — ED Triage Notes (Signed)
 C/O right lower back pain radiating down right leg into foot.  Has a broken bone in right foot, has been wearing a boot and plans to have surgery on foot in near future.

## 2023-07-01 NOTE — Discharge Instructions (Addendum)
 Please continue Tylenol Celebrex and take muscle relaxer as directed.  Follow-up with pain specialist for further evaluation.  Return here for new or worse symptoms including numbness weakness or bowel or bladder dysfunction.  Please do not drive while using boot or while having severe right foot pain as you could get into an accident.

## 2023-07-08 ENCOUNTER — Other Ambulatory Visit: Payer: Self-pay | Admitting: Podiatry

## 2023-07-14 ENCOUNTER — Other Ambulatory Visit: Payer: Self-pay

## 2023-07-14 ENCOUNTER — Encounter
Admission: RE | Admit: 2023-07-14 | Discharge: 2023-07-14 | Disposition: A | Discharge status: Home / Self Care | Source: Ambulatory Visit | Attending: Podiatry | Admitting: Podiatry

## 2023-07-14 DIAGNOSIS — Z01812 Encounter for preprocedural laboratory examination: Secondary | ICD-10-CM

## 2023-07-14 DIAGNOSIS — I10 Essential (primary) hypertension: Secondary | ICD-10-CM

## 2023-07-14 DIAGNOSIS — E119 Type 2 diabetes mellitus without complications: Secondary | ICD-10-CM

## 2023-07-14 HISTORY — DX: Anemia, unspecified: D64.9

## 2023-07-14 HISTORY — DX: Sleep apnea, unspecified: G47.30

## 2023-07-14 HISTORY — DX: Type 2 diabetes mellitus without complications: E11.9

## 2023-07-14 HISTORY — DX: Unspecified osteoarthritis, unspecified site: M19.90

## 2023-07-14 NOTE — Patient Instructions (Addendum)
 Your procedure is scheduled on: 07/22/23 - Friday Report to the Registration Desk on the 1st floor of the [facility]. To find out your arrival time, please call [phone] between 1PM - 3PM on: 07/21/23 - Thursday If your arrival time is 6:00 am, do not arrive before that time as the [facility] entrance doors do not open until 6:00 am. [facility] for labs on 04/15/ at 9 am located at [facility] , [address] , 1st office on the right as soon as you enter the building.   REMEMBER: Instructions that are not followed completely may result in serious medical risk, up to and including death; or upon the discretion of your surgeon and anesthesiologist your surgery may need to be rescheduled.  Do not eat food after midnight the night before surgery.  No gum chewing or hard candies.  You may however, drink CLEAR liquids up to 2 hours before you are scheduled to arrive for your surgery. Do not drink anything within 2 hours of your scheduled arrival time.  Clear liquids include: - water   In addition, your doctor has ordered for you to drink the provided:  Gatorade G2 Drinking this carbohydrate drink up to two hours before surgery helps to reduce insulin resistance and improve patient outcomes. Please complete drinking 2 hours before scheduled arrival time.  One week prior to surgery: Stop Anti-inflammatories (NSAIDS) such as Advil, Aleve, Ibuprofen, Motrin, Naproxen, Naprosyn and Aspirin based products such as Excedrin, Goody's Powder, BC Powder. You may continue to take Tylenol if needed for pain up until the day of surgery.  Stop ANY OVER THE COUNTER supplements until after surgery. Women Vitamin  HOLD lisinopril-hydrochlorothiazide on the day of surgery  HOLD metFORMIN (GLUCOPHAGE) beginning 07/20/23, may resume after surgery.   ON THE DAY OF SURGERY ONLY TAKE THESE MEDICATIONS WITH SIPS OF WATER:  amLODipine (NORVASC)  baclofen  (LIORESAL)  celecoxib (CELEBREX)  fluticasone (FLONASE)  omeprazole (PRILOSEC)    No Alcohol for 24 hours before or after surgery.  No Smoking including e-cigarettes for 24 hours before surgery.  No chewable tobacco products for at least 6 hours before surgery.  No nicotine patches on the day of surgery.  Do not use any "recreational" drugs for at least a week (preferably 2 weeks) before your surgery.  Please be advised that the combination of cocaine and anesthesia may have negative outcomes, up to and including death. If you test positive for cocaine, your surgery will be cancelled.  On the morning of surgery brush your teeth with toothpaste and water, you may rinse your mouth with mouthwash if you wish. Do not swallow any toothpaste or mouthwash.  Use CHG Soap or wipes as directed on instruction sheet.  Do not wear jewelry, make-up, hairpins, clips or nail polish.  For welded (permanent) jewelry: bracelets, anklets, waist bands, etc.  Please have this removed prior to surgery.  If it is not removed, there is a chance that hospital personnel will need to cut it off on the day of surgery.  Do not wear lotions, powders, or perfumes.   Do not shave body hair from the neck down 48 hours before surgery.  Contact lenses, hearing aids and dentures may not be worn into surgery.  Do not bring valuables to the hospital. [facility] is not responsible for any missing/lost belongings or valuables.   Notify your doctor if there is any change in your medical condition (cold, fever, infection).  Wear comfortable clothing (specific to your surgery type) to the hospital.  After surgery, you can help prevent lung complications by doing breathing exercises.  Take deep breaths and cough every 1-2 hours. Your doctor may order a device called an Incentive Spirometer to help you take deep breaths.  When coughing or sneezing, hold a pillow firmly against your incision with both hands. This is  called "splinting." Doing this helps protect your incision. It also decreases belly discomfort.  If you are being admitted to the hospital overnight, leave your suitcase in the car. After surgery it may be brought to your room.  In case of increased patient census, it may be necessary for you, the patient, to continue your postoperative care in the Same Day Surgery department.  If you are being discharged the day of surgery, you will not be allowed to drive home. You will need a responsible individual to drive you home and stay with you for 24 hours after surgery.   If you are taking public transportation, you will need to have a responsible individual with you.  Please call the Pre-admissions Testing Dept. at [phone] if you have any questions about these instructions.  Surgery Visitation Policy:  Patients having surgery or a procedure may have two visitors.  Children under the age of 22 must have an adult with them who is not the patient.  Inpatient Visitation:    Visiting hours are 7 a.m. to 8 p.m. Up to four visitors are allowed at one time in a patient room. The visitors may rotate out with other people during the day.  One visitor age 60 or older may stay with the patient overnight and must be in the room by 8 p.m.     Preparing for Surgery with CHLORHEXIDINE GLUCONATE (CHG) Soap  Chlorhexidine Gluconate (CHG) Soap  o An antiseptic cleaner that kills germs and bonds with the skin to continue killing germs even after washing  o Used for showering the night before surgery and morning of surgery  Before surgery, you can play an important role by reducing the number of germs on your skin.  CHG (Chlorhexidine gluconate) soap is an antiseptic cleanser which kills germs and bonds with the skin to continue killing germs even after washing.  Please do not use if you have an allergy to CHG or antibacterial soaps. If your skin becomes reddened/irritated stop using the  CHG.  1. Shower the NIGHT BEFORE SURGERY and the MORNING OF SURGERY with CHG soap.  2. If you choose to wash your hair, wash your hair first as usual with your normal shampoo.  3. After shampooing, rinse your hair and body thoroughly to remove the shampoo.  4. Use CHG as you would any other liquid soap. You can apply CHG directly to the skin and wash gently with a scrungie or a clean washcloth.  5. Apply the CHG soap to your body only from the neck down. Do not use on open wounds or open sores. Avoid contact with your eyes, ears, mouth, and genitals (private parts). Wash face and genitals (private parts) with your normal soap.  6. Wash thoroughly, paying special attention to the area where your surgery will be performed.  7. Thoroughly rinse your body with warm water.  8. Do not shower/wash with your normal soap after using and rinsing off the CHG soap.  9. Pat yourself dry with a clean towel.  10. Wear clean pajamas to bed the night before surgery.  12. Place  clean sheets on your bed the night of your first shower and do not sleep with pets.  13. Shower again with the CHG soap on the day of surgery prior to arriving at the hospital.  14. Do not apply any deodorants/lotions/powders.  15. Please wear clean clothes to the hospital.

## 2023-07-19 ENCOUNTER — Encounter
Admission: RE | Admit: 2023-07-19 | Discharge: 2023-07-19 | Disposition: A | Discharge status: Home / Self Care | Source: Ambulatory Visit | Attending: Podiatry | Admitting: Podiatry

## 2023-07-19 DIAGNOSIS — Z01818 Encounter for other preprocedural examination: Secondary | ICD-10-CM | POA: Diagnosis present

## 2023-07-19 DIAGNOSIS — E119 Type 2 diabetes mellitus without complications: Secondary | ICD-10-CM

## 2023-07-19 DIAGNOSIS — Z01812 Encounter for preprocedural laboratory examination: Secondary | ICD-10-CM

## 2023-07-19 DIAGNOSIS — I10 Essential (primary) hypertension: Secondary | ICD-10-CM

## 2023-07-22 ENCOUNTER — Other Ambulatory Visit: Payer: Self-pay

## 2023-07-22 ENCOUNTER — Encounter
Admission: RE | Disposition: A | Discharge status: Home / Self Care | Payer: Self-pay | Source: Home / Self Care | Attending: Podiatry

## 2023-07-22 ENCOUNTER — Ambulatory Visit: Admitting: Anesthesiology

## 2023-07-22 ENCOUNTER — Ambulatory Visit
Admission: RE | Admit: 2023-07-22 | Discharge: 2023-07-22 | Disposition: A | Discharge status: Home / Self Care | Attending: Podiatry | Admitting: Podiatry

## 2023-07-22 ENCOUNTER — Encounter: Payer: Self-pay | Admitting: Podiatry

## 2023-07-22 ENCOUNTER — Ambulatory Visit

## 2023-07-22 ENCOUNTER — Ambulatory Visit: Admitting: Urgent Care

## 2023-07-22 DIAGNOSIS — F32A Depression, unspecified: Secondary | ICD-10-CM | POA: Insufficient documentation

## 2023-07-22 DIAGNOSIS — E66813 Obesity, class 3: Secondary | ICD-10-CM | POA: Diagnosis not present

## 2023-07-22 DIAGNOSIS — Z6841 Body Mass Index (BMI) 40.0 and over, adult: Secondary | ICD-10-CM | POA: Diagnosis not present

## 2023-07-22 DIAGNOSIS — X58XXXA Exposure to other specified factors, initial encounter: Secondary | ICD-10-CM | POA: Insufficient documentation

## 2023-07-22 DIAGNOSIS — K449 Diaphragmatic hernia without obstruction or gangrene: Secondary | ICD-10-CM | POA: Insufficient documentation

## 2023-07-22 DIAGNOSIS — G4733 Obstructive sleep apnea (adult) (pediatric): Secondary | ICD-10-CM | POA: Insufficient documentation

## 2023-07-22 DIAGNOSIS — S86311A Strain of muscle(s) and tendon(s) of peroneal muscle group at lower leg level, right leg, initial encounter: Secondary | ICD-10-CM | POA: Insufficient documentation

## 2023-07-22 DIAGNOSIS — I10 Essential (primary) hypertension: Secondary | ICD-10-CM | POA: Diagnosis not present

## 2023-07-22 DIAGNOSIS — E119 Type 2 diabetes mellitus without complications: Secondary | ICD-10-CM | POA: Insufficient documentation

## 2023-07-22 DIAGNOSIS — G8929 Other chronic pain: Secondary | ICD-10-CM | POA: Insufficient documentation

## 2023-07-22 DIAGNOSIS — F419 Anxiety disorder, unspecified: Secondary | ICD-10-CM | POA: Insufficient documentation

## 2023-07-22 DIAGNOSIS — Z01812 Encounter for preprocedural laboratory examination: Secondary | ICD-10-CM

## 2023-07-22 DIAGNOSIS — K219 Gastro-esophageal reflux disease without esophagitis: Secondary | ICD-10-CM | POA: Insufficient documentation

## 2023-07-22 HISTORY — PX: LIGAMENT REPAIR: SHX5444

## 2023-07-22 LAB — GLUCOSE, CAPILLARY
Glucose-Capillary: 96 mg/dL (ref 70–99)
Glucose-Capillary: 112 mg/dL — ABNORMAL HIGH (ref 70–99)

## 2023-07-22 SURGERY — REPAIR, LIGAMENT
Anesthesia: General | Site: Ankle | Laterality: Right

## 2023-07-22 MED ORDER — ONDANSETRON HCL 4 MG/2ML IJ SOLN
INTRAMUSCULAR | Status: AC
  Filled 2023-07-22: qty 2

## 2023-07-22 MED ORDER — 0.9 % SODIUM CHLORIDE (POUR BTL) OPTIME
TOPICAL | Status: DC | PRN
  Administered 2023-07-22: 500 mL

## 2023-07-22 MED ORDER — LIDOCAINE HCL (PF) 2 % IJ SOLN
INTRAMUSCULAR | Status: AC
  Filled 2023-07-22: qty 5

## 2023-07-22 MED ORDER — PHENYLEPHRINE HCL-NACL 20-0.9 MG/250ML-% IV SOLN
INTRAVENOUS | Status: AC
  Filled 2023-07-22: qty 250

## 2023-07-22 MED ORDER — BUPIVACAINE-EPINEPHRINE (PF) 0.25% -1:200000 IJ SOLN
INTRAMUSCULAR | Status: DC | PRN
  Administered 2023-07-22: 10 mL

## 2023-07-22 MED ORDER — ASPIRIN 325 MG PO TBEC: 325.0000 mg | DELAYED_RELEASE_TABLET | Freq: Two times a day (BID) | ORAL | 3 refills | Status: AC

## 2023-07-22 MED ORDER — DEXAMETHASONE SODIUM PHOSPHATE 10 MG/ML IJ SOLN
INTRAMUSCULAR | Status: AC
  Filled 2023-07-22: qty 1

## 2023-07-22 MED ORDER — LACTATED RINGERS IV SOLN: INTRAVENOUS | Status: DC

## 2023-07-22 MED ORDER — SODIUM CHLORIDE 0.9% FLUSH: 3.0000 mL | INTRAVENOUS | Status: DC | PRN

## 2023-07-22 MED ORDER — FENTANYL CITRATE (PF) 100 MCG/2ML IJ SOLN
INTRAMUSCULAR | Status: AC
  Filled 2023-07-22: qty 2

## 2023-07-22 MED ORDER — PROPOFOL 10 MG/ML IV BOLUS
INTRAVENOUS | Status: DC | PRN
  Administered 2023-07-22: 100 mg via INTRAVENOUS

## 2023-07-22 MED ORDER — FENTANYL CITRATE (PF) 100 MCG/2ML IJ SOLN
INTRAMUSCULAR | Status: DC | PRN
  Administered 2023-07-22: 25 ug via INTRAVENOUS

## 2023-07-22 MED ORDER — LIDOCAINE HCL (CARDIAC) PF 100 MG/5ML IV SOSY
PREFILLED_SYRINGE | INTRAVENOUS | Status: DC | PRN
  Administered 2023-07-22: 100 mg via INTRAVENOUS

## 2023-07-22 MED ORDER — CHLORHEXIDINE GLUCONATE 0.12 % MT SOLN
15.0000 mL | Freq: Once | OROMUCOSAL | Status: AC
  Administered 2023-07-22: 15 mL via OROMUCOSAL

## 2023-07-22 MED ORDER — BUPIVACAINE HCL (PF) 0.5 % IJ SOLN
INTRAMUSCULAR | Status: AC
  Filled 2023-07-22: qty 30

## 2023-07-22 MED ORDER — EPHEDRINE 5 MG/ML INJ
INTRAVENOUS | Status: AC
Start: 2023-07-22 — End: ?
  Filled 2023-07-22: qty 5

## 2023-07-22 MED ORDER — CEFAZOLIN SODIUM-DEXTROSE 2-4 GM/100ML-% IV SOLN
2.0000 g | INTRAVENOUS | Status: AC
  Administered 2023-07-22: 2 g via INTRAVENOUS

## 2023-07-22 MED ORDER — CHLORHEXIDINE GLUCONATE 0.12 % MT SOLN
OROMUCOSAL | Status: AC
  Filled 2023-07-22: qty 15

## 2023-07-22 MED ORDER — OXYCODONE-ACETAMINOPHEN 5-325 MG PO TABS: 1.0000 | ORAL_TABLET | Freq: Four times a day (QID) | ORAL | 0 refills | Status: DC | PRN

## 2023-07-22 MED ORDER — ONDANSETRON HCL 4 MG/2ML IJ SOLN: 4.0000 mg | Freq: Four times a day (QID) | INTRAMUSCULAR | Status: DC | PRN

## 2023-07-22 MED ORDER — PROPOFOL 10 MG/ML IV BOLUS
INTRAVENOUS | Status: AC
  Filled 2023-07-22: qty 20

## 2023-07-22 MED ORDER — PHENYLEPHRINE 80 MCG/ML (10ML) SYRINGE FOR IV PUSH (FOR BLOOD PRESSURE SUPPORT)
PREFILLED_SYRINGE | INTRAVENOUS | Status: DC | PRN
  Administered 2023-07-22 (×5): 80 ug via INTRAVENOUS

## 2023-07-22 MED ORDER — ORAL CARE MOUTH RINSE: 15.0000 mL | Freq: Once | OROMUCOSAL | Status: AC

## 2023-07-22 MED ORDER — ONDANSETRON HCL 4 MG/2ML IJ SOLN
INTRAMUSCULAR | Status: DC | PRN
  Administered 2023-07-22: 4 mg via INTRAVENOUS

## 2023-07-22 MED ORDER — LIDOCAINE HCL (PF) 1 % IJ SOLN
INTRAMUSCULAR | Status: AC
  Filled 2023-07-22: qty 30

## 2023-07-22 MED ORDER — ACETAMINOPHEN 10 MG/ML IV SOLN: 1000.0000 mg | Freq: Once | INTRAVENOUS | Status: DC | PRN

## 2023-07-22 MED ORDER — SODIUM CHLORIDE 0.9 % IV SOLN: INTRAVENOUS | Status: DC | PRN

## 2023-07-22 MED ORDER — EPHEDRINE SULFATE-NACL 50-0.9 MG/10ML-% IV SOSY
PREFILLED_SYRINGE | INTRAVENOUS | Status: DC | PRN
  Administered 2023-07-22 (×2): 10 mg via INTRAVENOUS
  Administered 2023-07-22: 5 mg via INTRAVENOUS

## 2023-07-22 MED ORDER — PHENYLEPHRINE HCL-NACL 20-0.9 MG/250ML-% IV SOLN
INTRAVENOUS | Status: DC | PRN
  Administered 2023-07-22: 40 ug/min via INTRAVENOUS

## 2023-07-22 MED ORDER — METOCLOPRAMIDE HCL 10 MG PO TABS: 5.0000 mg | ORAL_TABLET | Freq: Three times a day (TID) | ORAL | Status: DC | PRN

## 2023-07-22 MED ORDER — PHENYLEPHRINE 80 MCG/ML (10ML) SYRINGE FOR IV PUSH (FOR BLOOD PRESSURE SUPPORT)
PREFILLED_SYRINGE | INTRAVENOUS | Status: AC
  Filled 2023-07-22: qty 10

## 2023-07-22 MED ORDER — BUPIVACAINE LIPOSOME 1.3 % IJ SUSP
INTRAMUSCULAR | Status: DC | PRN
  Administered 2023-07-22: 10 mL

## 2023-07-22 MED ORDER — MIDAZOLAM HCL 2 MG/2ML IJ SOLN
INTRAMUSCULAR | Status: DC | PRN
  Administered 2023-07-22: 2 mg via INTRAVENOUS

## 2023-07-22 MED ORDER — GLYCOPYRROLATE 0.2 MG/ML IJ SOLN
INTRAMUSCULAR | Status: AC
  Filled 2023-07-22: qty 1

## 2023-07-22 MED ORDER — METOCLOPRAMIDE HCL 5 MG/ML IJ SOLN: 5.0000 mg | Freq: Three times a day (TID) | INTRAMUSCULAR | Status: DC | PRN

## 2023-07-22 MED ORDER — SODIUM CHLORIDE 0.9% FLUSH: 3.0000 mL | Freq: Two times a day (BID) | INTRAVENOUS | Status: DC

## 2023-07-22 MED ORDER — DEXAMETHASONE SODIUM PHOSPHATE 10 MG/ML IJ SOLN
INTRAMUSCULAR | Status: DC | PRN
  Administered 2023-07-22: 10 mg via INTRAVENOUS

## 2023-07-22 MED ORDER — ONDANSETRON HCL 4 MG/2ML IJ SOLN: 4.0000 mg | Freq: Once | INTRAMUSCULAR | Status: DC | PRN

## 2023-07-22 MED ORDER — ONDANSETRON HCL 4 MG PO TABS: 4.0000 mg | ORAL_TABLET | Freq: Four times a day (QID) | ORAL | Status: DC | PRN

## 2023-07-22 MED ORDER — BUPIVACAINE LIPOSOME 1.3 % IJ SUSP
INTRAMUSCULAR | Status: AC
  Filled 2023-07-22: qty 10

## 2023-07-22 MED ORDER — MIDAZOLAM HCL 2 MG/2ML IJ SOLN
INTRAMUSCULAR | Status: AC
  Filled 2023-07-22: qty 2

## 2023-07-22 MED ORDER — BUPIVACAINE-EPINEPHRINE (PF) 0.25% -1:200000 IJ SOLN
INTRAMUSCULAR | Status: AC
  Filled 2023-07-22: qty 30

## 2023-07-22 MED ORDER — FENTANYL CITRATE (PF) 100 MCG/2ML IJ SOLN: 25.0000 ug | INTRAMUSCULAR | Status: DC | PRN

## 2023-07-22 MED ORDER — CEFAZOLIN SODIUM-DEXTROSE 2-4 GM/100ML-% IV SOLN
INTRAVENOUS | Status: AC
  Filled 2023-07-22: qty 100

## 2023-07-22 MED ORDER — GLYCOPYRROLATE 0.2 MG/ML IJ SOLN
INTRAMUSCULAR | Status: DC | PRN
  Administered 2023-07-22: .2 mg via INTRAVENOUS

## 2023-07-22 SURGICAL SUPPLY — 56 items
BENZOIN TINCTURE PRP APPL 2/3 (GAUZE/BANDAGES/DRESSINGS) ×1 IMPLANT
BIT DRILL 4X4.5 FOOTPRINT STR (BIT) ×1 IMPLANT
BLADE SURG 15 STRL LF DISP TIS (BLADE) ×1 IMPLANT
BLADE SURG MINI STRL (BLADE) ×1 IMPLANT
BNDG ELASTIC 4X5.8 VLCR NS LF (GAUZE/BANDAGES/DRESSINGS) ×2 IMPLANT
BNDG ESMARCH 4X12 STRL LF (GAUZE/BANDAGES/DRESSINGS) ×1 IMPLANT
BNDG ESMARCH 6X12 STRL LF (GAUZE/BANDAGES/DRESSINGS) ×1 IMPLANT
BNDG GAUZE DERMACEA FLUFF 4 (GAUZE/BANDAGES/DRESSINGS) ×1 IMPLANT
BNDG STRETCH GAUZE 3IN X12FT (GAUZE/BANDAGES/DRESSINGS) ×1 IMPLANT
DRAPE FLUOR MINI C-ARM 54X84 (DRAPES) ×1 IMPLANT
DRILL 4X4.5 FOOTPRINT STR (BIT) IMPLANT
DURAPREP 26ML APPLICATOR (WOUND CARE) ×1 IMPLANT
ELECT REM PT RETURN 9FT ADLT (ELECTROSURGICAL) ×1 IMPLANT
ELECTRODE REM PT RTRN 9FT ADLT (ELECTROSURGICAL) ×1 IMPLANT
GAUZE SPONGE 4X4 12PLY STRL (GAUZE/BANDAGES/DRESSINGS) ×1 IMPLANT
GAUZE STRETCH 2X75IN STRL (MISCELLANEOUS) ×1 IMPLANT
GAUZE XEROFORM 1X8 LF (GAUZE/BANDAGES/DRESSINGS) ×1 IMPLANT
GLOVE BIO SURGEON STRL SZ7.5 (GLOVE) ×1 IMPLANT
GLOVE INDICATOR 8.0 STRL GRN (GLOVE) ×1 IMPLANT
GOWN STRL REUS W/ TWL XL LVL3 (GOWN DISPOSABLE) ×2 IMPLANT
HANDLE YANKAUER SUCT BULB TIP (MISCELLANEOUS) ×1 IMPLANT
IV NS 500ML BAXH (IV SOLUTION) ×1 IMPLANT
KIT TURNOVER KIT A (KITS) ×1 IMPLANT
LABEL OR SOLS (LABEL) IMPLANT
MANIFOLD NEPTUNE II (INSTRUMENTS) ×1 IMPLANT
NDL FILTER BLUNT 18X1 1/2 (NEEDLE) ×1 IMPLANT
NDL HYPO 18GX1.5 BLUNT FILL (NEEDLE) ×1 IMPLANT
NDL HYPO 25X1 1.5 SAFETY (NEEDLE) ×3 IMPLANT
NDL SUT 5 .5 CRC TPR PNT MAYO (NEEDLE) ×1 IMPLANT
NEEDLE FILTER BLUNT 18X1 1/2 (NEEDLE) IMPLANT
NEEDLE HYPO 18GX1.5 BLUNT FILL (NEEDLE) IMPLANT
NEEDLE HYPO 25X1 1.5 SAFETY (NEEDLE) ×2 IMPLANT
NS IRRIG 500ML POUR BTL (IV SOLUTION) ×1 IMPLANT
PACK EXTREMITY ARMC (MISCELLANEOUS) ×1 IMPLANT
RASP SM TEAR CROSS CUT (RASP) IMPLANT
SPLINT CAST 1 STEP 5X30 WHT (MISCELLANEOUS) ×1 IMPLANT
SPLINT PLASTER CAST FAST 5X30 (CAST SUPPLIES) ×1 IMPLANT
SPONGE T-LAP 18X18 ~~LOC~~+RFID (SPONGE) ×1 IMPLANT
STOCKINETTE IMPERVIOUS 9X36 MD (GAUZE/BANDAGES/DRESSINGS) ×1 IMPLANT
STOCKINETTE M/LG 89821 (MISCELLANEOUS) ×1 IMPLANT
STRIP CLOSURE SKIN 1/2X4 (GAUZE/BANDAGES/DRESSINGS) ×1 IMPLANT
SUT ETHILON 3-0 FS-10 30 BLK (SUTURE) ×1 IMPLANT
SUT MNCRL 4-0 27 PS-2 XMFL (SUTURE) IMPLANT
SUT PDS AB 0 CT1 27 (SUTURE) IMPLANT
SUT ULTRABRAID #2 38 (SUTURE) ×1 IMPLANT
SUT VIC AB 0 SH 27 (SUTURE) ×1 IMPLANT
SUT VIC AB 2-0 SH 27XBRD (SUTURE) ×2 IMPLANT
SUT VIC AB 3-0 SH 27X BRD (SUTURE) ×1 IMPLANT
SUT VICRYL AB 3-0 FS1 BRD 27IN (SUTURE) ×1 IMPLANT
SUTURE EHLN 3-0 FS-10 30 BLK (SUTURE) IMPLANT
SUTURE MNCRL 4-0 27XMF (SUTURE) ×1 IMPLANT
SYR 10ML LL (SYRINGE) ×2 IMPLANT
SYR 3ML LL SCALE MARK (SYRINGE) ×1 IMPLANT
TRAP FLUID SMOKE EVACUATOR (MISCELLANEOUS) ×1 IMPLANT
WAND TOPAZ MICRO DEBRIDER (MISCELLANEOUS) IMPLANT
WATER STERILE IRR 500ML POUR (IV SOLUTION) ×1 IMPLANT

## 2023-07-22 NOTE — Anesthesia Preprocedure Evaluation (Addendum)
 Anesthesia Evaluation  Patient identified by MRN, date of birth, ID band Patient awake    Reviewed: Allergy & Precautions, NPO status , Patient's Chart, lab work & pertinent test results  History of Anesthesia Com

## 2023-07-22 NOTE — Op Note (Signed)
 Operative note   Surgeon:Asianna Brundage [employer] forces logistics/support/administrative officer: None    Preop diagnosis: 1.  Peroneal longus tendon tear right lateral ankle    Postop diagnosis: 1.  Peroneal longus tendon tear right lateral ankle 2.  Tenosynovitis peroneus brevis right lateral ankle    Procedure: 1.  Repair peroneal longus tendon with tubularization and anastomosis to peroneus brevis tendon 2.  Tenolysis peroneus brevis tendon right lateral ankle 3.  Intraoperative fluoroscopy use    EBL: Minimal    Anesthesia:local and general    Hemostasis: Mid calf tourniquet inflated to 200 mmHg for 45 minutes    Specimen: Peroneal longus tendon tear with bone right lateral ankle    Complications: None    Operative indications:Ariel Davis is an 58 y.o. that presents today for surgical intervention.  The risks/benefits/alternatives/complications have been discussed and consent has been given.    Procedure:  Patient was brought into the OR and placed on the operating table in thesupine position. After anesthesia was obtained theright lower extremity was prepped and draped in usual sterile fashion.  Attention was directed to the lateral aspect of the right ankle where a longitudinal incision was performed along the peroneal tendon region from the distal fibula to the fifth metatarsal base.  Sharp and blunt dissection carried down to the tendon sheath.  Care was taken to retract all vital neurovascular structures and Bovie cauterize bleeders as appropriate.  Next the peroneal tendon sheaths were then entered.  At this time a large bulbous peroneal longus tendon was noted with longitudinal tearing.  Incision was made within the peroneal longus tendon and a large os peroneum was noted within the tendon sheath at the bulbous thickened area.  This was excised.  Remaining longitudinal tearing of the peroneal tendon was excised as well and sent for pathological examination.  The wound was flushed with copious amounts of irrigation.   The peroneal brevis tendon was then evaluated with a large amount of tenosynovitis and fibrotic tissue.  This was then excised.  Tubularization of the proximal two thirds of the peroneal longus repair was performed with a 3-0 Vicryl.  An anastomosis of the distal one third of the peroneal longus tear was performed to the brevis with a 3-0 Vicryl.  All areas were then infiltrated with the Topaz wand.  The wound was flushed 1 final time and closure was performed with a 3-0 Vicryl for the peroneal tendon sheath, 3-0 Vicryl for the subcutaneous tissue and a 3-0 nylon for the skin.  Patient was placed in a bulky sterile dressing with the foot in neutral position.    Patient tolerated the procedure and anesthesia well.  Was transported from the OR to the PACU with all vital signs stable and vascular status intact. To be discharged per routine protocol.  Will follow up in approximately 1 week in the outpatient clinic.

## 2023-07-22 NOTE — H&P (Signed)
 HISTORY AND PHYSICAL INTERVAL NOTE:  07/22/2023  7:45 AM  Ariel Davis  has presented today for surgery, with the diagnosis of Peroneal tendon rupture, right, initial encounter.  The various methods of treatment have been discussed with the patient.  No guarantees were given.  After consideration of risks, benefits and other options for treatment, the patient has consented to surgery.  I have reviewed the patients' chart and labs.     A history and physical examination was performed in my office.  The patient was reexamined.  There have been no changes to this history and physical examination.  Anell Baptist A

## 2023-07-22 NOTE — Anesthesia Procedure Notes (Signed)
 Procedure Name: LMA Insertion Date/Time: 07/22/2023 7:46 AM  Performed by: Ellwood Haber, CRNAPre-anesthesia Checklist: Patient identified, Patient being monitored, Timeout performed, Emergency Drugs available and Suction available Patient Re-evaluated:Patient Re-evaluated prior to induction Oxygen Delivery Method: Circle system utilized Preoxygenation: Pre-oxygenation with 100% oxygen Induction Type: IV induction Ventilation: Mask ventilation without difficulty LMA: LMA inserted LMA Size: 4.0 Tube type: Oral Number of attempts: 1 Placement Confirmation: positive ETCO2 and breath sounds checked- equal and bilateral Tube secured with: Tape Dental Injury: Teeth and Oropharynx as per pre-operative assessment  Comments: Smooth atraumatic LMA placement, no complications noted.

## 2023-07-22 NOTE — Anesthesia Postprocedure Evaluation (Signed)
 Anesthesia Post Note  Patient: Ariel Davis  Procedure(s) Performed: REPAIR, LIGAMENT (Right: Ankle)  Patient location during evaluation: PACU Anesthesia Type: General Level of consciousness: awake and alert, oriented and patient cooperative Pain management: pain level controlled Vital Signs Assessment: post-procedure vital signs reviewed and stable Respiratory status: spontaneous breathing, nonlabored ventilation and respiratory function stable Cardiovascular status: blood pressure returned to baseline and stable Postop Assessment: adequate PO intake Anesthetic complications: no   No notable events documented.   Last Vitals:  Vitals:   07/22/23 0915 07/22/23 0939  BP: 115/72 139/88  Pulse: 78 79  Resp: 19 16  Temp:  (!) 36.1 C  SpO2: 94% 94%    Last Pain:  Vitals:   07/22/23 0939  TempSrc: Temporal  PainSc: 0-No pain                 Dorothey Gate

## 2023-07-22 NOTE — Transfer of Care (Signed)
 Immediate Anesthesia Transfer of Care Note  Patient: Ariel Davis  Procedure(s) Performed: REPAIR, LIGAMENT (Right: Ankle)  Patient Location: PACU  Anesthesia Type:General  Level of Consciousness: drowsy  Airway & Oxygen Therapy: Patient Spontanous Breathing and Patient connected to face mask oxygen  Post-op Assessment: Report given to RN and Post -op Vital signs reviewed and stable  Post vital signs: Reviewed and stable  Last Vitals:  Vitals Value Taken Time  BP 115/60 07/22/23 0857  Temp 35.9 0857  Pulse 76 07/22/23 0900  Resp 19 07/22/23 0900  SpO2 98 % 07/22/23 0900  Vitals shown include unfiled device data.  Last Pain:  Vitals:   07/22/23 0616  TempSrc: Temporal  PainSc: 6          Complications: No notable events documented.

## 2023-07-22 NOTE — Discharge Instructions (Signed)
 [facility]  POST OPERATIVE INSTRUCTIONS FOR DR. Althea Atkinson AND DR. BAKER [facility] CLINIC PODIATRY DEPARTMENT   Take your medication as prescribed.  Pain medication should be taken only as needed.  Take an Aspirin  twice daily with food.  Keep the dressing clean, dry and intact.  Keep your foot elevated above the heart level for the first 48 hours.  We have instructed you to be non-weight bearing.  Always wear your post-op shoe when walking.  Always use your crutches if you are to be non-weight bearing.  Do not take a shower. Baths are permissible as long as the foot is kept out of the water.   Every hour you are awake:  Bend your knee 15 times. Flex foot 15 times Massage calf 15 times  Call [facility] [phone]) if any of the following problems occur: You develop a temperature or fever. The bandage becomes saturated with blood. Medication does not stop your pain. Injury of the foot occurs. Any symptoms of infection including redness, odor, or red streaks running from wound.

## 2023-07-23 ENCOUNTER — Encounter: Payer: Self-pay | Admitting: Podiatry

## 2023-07-25 LAB — SURGICAL PATHOLOGY

## 2024-01-26 NOTE — Progress Notes (Addendum)
 [facility] Follow Up  Assessment/Plan:   Chief Complaint  Patient presents with  . Weight Management   Problem List Items Addressed This Visit       Unprioritized   OSA (obstructive sleep apnea) - Primary   Relevant Medications   semaglutide (OZEMPIC) 2 mg/dose (8 mg/3 mL) PnIj   Class 3 obesity (CMS-HCC)   Ariel Davis is a 58 y.o. 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 Comorbidities include preDM, OSA, HTN, depression, and OA. Barriers include limited mobility, but pt has achieved her goal weight (~234 lb) for knee replacement surgery. She used to follow the bariatric surgery team but decided to lose without surgery. Assets include Pt's puppy Mallissa who increases her activity levels.   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 Today's weight/BMI: (!) 117.6 kg (259 lb 4.8 oz), BMI (!) 40.6 (01/26/2024)  % Body weight loss: N/A Today's   (01/26/2024) <= 48 (10/17/19) INBODY 01/26/2024 ske muscle 73 lbs, 48% body fat%    Referred by: Jon Lynn, MD in PM&R.  GOALS:  1) swap sweet tea 1 lg cup/day to unsweet tea with stevia 2) swap Frosted Flakes for Cheerios, replace apple juice with whole fruit, switch soda to diet soda, and replace Chinese food with healthier frozen meals. 3) swap potato chips to skinny popcorn, string cheese.  I have reviewed the patient's medical history, lifestyle history and labs/tests.  My recommendations include the following:  Lifestyle Pattern Summary: - See GOALS above.   Sleep: Currently sleeping 6-7 hours with CPAP. -- Recommend 7-8 hours of sleep at night.  Stress management: Current stressors: health and family related  Weight gain causing medication:  Hydroxyzine , Gabapentin, Lyrica , Effexor  -- depression followed closely by a counselor. Hx of Trazodone.  Medication: ozempic microdosing. 0.25mg  weekly --9 clicks per week. After 4 weeks go up to 18 clicks per week. Pt verbalized understanding of using microdosing and was trained on counting clicks. Extra needles sent. Ozempic 2mg  pen prescribed.  TRIED in past: - Victoza (liraglutide): started 01/01/20 at 252 lbs; restarted 11/04/20 at 235 lbs--stoppped--lost coverage - Topamax : started on 06/13/18 at 244 lb. stoppped - Metformin: started on 08/21/19 at 248 lb.  stopped --insurance--does nto cover GLP1 even with OSA-- -[facility]--not qualify.   Obesity Surgery: again--pt declined. We revisited surgical option today due to minimal WL with medication. Discussed benefits and risks. Provided Pt with educational handout. Pt used to follow a bariatric surgery team in early 2021 but she decided to lose weight without surgery. Will continue with surgery education and work on lifestyle modifications and pharmacotherapy. (Updated 09/29/2021)  Medical conditions: Glaucoma: no Seizures: no Medullary thyroid  cancer (personal or family hx): no Multiple Endocrine Neoplasia: no Palpitations/Tachycardia: no Chest Pain: no past history of MI.  Headaches/Migraines: no Nephrolithiasis: no H/o pancreatitis: no GERD: no   Prior Surgeries: Choleycystectomy: yes Hysterectomy: no  Birth Control Methods: N/A      Elevated LFTs   Elev lft in April 2025. Repeat LFT. FIB-4 Calculation: 1.06 at 07/18/2023 11:46 AM Calculated from: SGOT/AST: 35 U/L at 07/18/2023 11:46 AM SGPT/ALT: 42 U/L at 07/18/2023 11:46 AM Platelets: 305 10*9/L at 07/18/2023 11:46 AM Age: 46 years  No etoh. Check hepatitis panel.  Relevant Orders   Hepatic Function Panel   Hepatitis Panel, Acute   Other Visit Diagnoses       Elevation of levels of liver transaminase levels       Relevant Orders   Hepatitis Panel, Acute       Assessment & Plan Class 3 obesity due to excess calories Class 3 obesity with significant weight gain due to stress and dietary habits. She has regained weight due to stress related to caregiving responsibilities and dietary choices. Unable to exercise due to chronic back pain and neuropathy. Discussed the potential benefits of weight loss on pain reduction and overall health. Explored pharmacological options for weight management, including the use of Ozempic (semaglutide) for appetite suppression and weight loss. Discussed financial constraints and potential assistance programs for medication affordability. Emphasized the importance of dietary changes and lifestyle modifications as a foundation for weight management. Surgery was discussed as the most effective option for durable weight loss, but she expressed fear and declined referral at this time. - Prescribe Ozempic 0.25 mg weekly (9 clicks per week) for 4 weeks, then increase to 18 clicks per week. Instruct to keep the medication refrigerated. - Instruct to sign up for GoodRx to obtain a coupon for Ozempic to reduce cost to $499. - Advise dietary modifications: swap Frosted Flakes for Cheerios, replace apple juice with whole fruit, switch soda to diet soda, and replace Chinese food with healthier frozen meals. - Encourage reduction of sugar intake and use of artificial sweeteners like Stevia. - Schedule follow-up in 2 months to assess progress and adjust treatment as needed.  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 Reports significant pain and difficulty with mobility, impacting her ability to exercise and perform daily activities. Neuropathy is contributing to the pain experienced in both legs. The old pelvic fracture continues to cause pain, and she is awaiting a follow-up appointment for further evaluation. -  Await follow-up appointment for further evaluation of pelvic fracture. - Encourage weight loss to potentially alleviate some of the pain symptoms.  Fatty liver disease Elevated liver function tests suggestive of fatty liver disease, likely secondary to obesity and weight gain. Discussed the impact of excess weight on liver health and the potential for improvement with weight loss. - Order liver function tests to monitor liver health. - Emphasize the importance of weight loss to improve liver function.  Return in about 2 months (around 03/27/2024) for Weight clinic F/U one slot..  I have reviewed and addressed the patient's adherence and response to prescribed medications. I have identified patient barriers to following the proposed medication and treatment plan, and have noted opportunities to optimize healthy behaviors. I have answered the patient's questions to satisfaction and the patient voices understanding.  Time: Greater than 50% of this encounter was spent in direct consultation with the patient in evaluation and discussing all of the above. Duration of encounter: 20 minutes.   HPI:   Ariel Davis is a 58 y.o. female who  has a past medical history of Arthritis, Chronic back pain, Clotting disorder (HHS-HCC), Depression, Femur fracture, left (CMS-HCC), GERD (gastroesophageal reflux disease), Heart palpitations, Hypertension, Knee pain, bilateral, Obesity (BMI 30-39.9), Osteoporosis, and Sleep apnea on cpap. who presents today for [facility] follow up.  Weight Management History Wt Readings from Last 6 Encounters:  01/26/24 (!) 117.6 kg (259 lb 4.8 oz)  01/11/24 ROLLEN)  113.4 kg (250 lb)  12/12/23 (!) 115.7 kg (255 lb)  12/01/23 (!) 117.2 kg (258 lb 4.8 oz)  11/21/23 (!) 113.4 kg (250 lb)  10/19/23 (!) 119 kg (262 lb 6.4 oz)      10/17/2019   10:58 AM 01/01/2020    1:40 PM 06/26/2020   10:29 AM 11/04/2020    8:31 AM 03/17/2021    7:00 AM 09/29/2021    9:55 AM  01/06/2022   10:05 AM  Waist Circumference  Waist Circumference 48 inches 47 inches 42 inches 45.6 inches 44 inches 45 inches 43.5 inches    History of Present Illness Wannetta Langland Helvie is a 58 year old female who presents for weight management and back pain.  Obesity and weight management - Difficulty managing weight over the past three years, coinciding with increased caregiving responsibilities and stress - Limited ability to exercise due to persistent back pain and neuropathy - Current weight management medications include topiramate  and metformin, without efficacy - Previous use of liraglutide without side effects - Diet includes frequent consumption of restaurant food (Chinese cuisine approximately three times weekly), sugary drinks (apple juice, sweet tea), Frosted Flakes for breakfast, and potato chips as snacks - Occasional soda consumption and use of artificial sweeteners in coffee  Chronic back pain and neuropathy - Persistent back pain, worsened by neuropathy affecting both legs - History of longstanding left pelvic bone fracture - Pain limits mobility and ability to perform daily activities, including transporting her mother to medical appointments  Psychosocial stressors - Significant stress over the past three years related to caregiving for her mother with dementia, now in a care facility - On disability with limited income (approximately $900 monthly)  Sleep apnea - Uses CPAP machine for sleep apnea    Lab Results  Component Value Date   A1C 5.6 07/18/2023   GLU 91 07/18/2023   CHOL 154 07/18/2023   LDL 90 07/18/2023   HDL 47 07/18/2023   TRIG 85 07/18/2023   AST 35 (H) 07/18/2023   ALT 42 07/18/2023   PLT 305 07/18/2023   TSH 1.259 11/12/2022   VITDTOTAL 20.9 03/23/2021   Past Medical/Surgical History:   Past Medical History[1] Past Surgical History[2]  Allergies:   Amoxicillin , Cymbalta [duloxetine], Hydrocodone , and Oxycodone   Social History:    Social History[3]  Family History:   Family History[4]  Medication list:   Current Medications[5] Above medication list reviewed and updated.  ROS:   A 12 point review of systems was negative except for pertinent items noted in the HPI   Current Medications:   Current Medications[6]  I have reviewed and (if needed) updated the patient's problem list, medications, allergies, past medical and surgical history, social and family history.  Vital Signs:   Body mass index is 40.6 kg/m.     Wt Readings from Last 3 Encounters:  01/26/24 (!) 117.6 kg (259 lb 4.8 oz)  01/11/24 (!) 113.4 kg (250 lb)  12/12/23 (!) 115.7 kg (255 lb)   Temp Readings from Last 3 Encounters:  12/12/23 36.4 C (97.5 F)  12/01/23 36.6 C (97.8 F)  09/15/23 36.7 C (98 F) (Temporal)   BP Readings from Last 3 Encounters:  01/26/24 132/85  01/11/24 133/87  12/12/23 131/80   Pulse Readings from Last 3 Encounters:  01/26/24 79  01/11/24 65  12/12/23 73     Physical Exam:   General: well appearing, in NAD, Body mass index is 40.6 kg/m. Ambulatory without help. Body fat distribution: General adiposity.  No supraclavicular adiposity. No dorsal adiposity.      Head: normocephalic atraumatic. Oral--mod crowding.  Eyes: PERRLA, EOMI, Sclera WNL.  Neck: supple, no LAD, no thyromegaly. Mod acanthosis,  CV: RRR no rubs or murmurs. Lungs: clear bilaterally to auscultation. No wheezing. ABD--difficulty palp organomegaly due to adiposity Extremities: no clubbing, cyanosis, No edema. Skin: no concerning lesions, rashes observed. No lipomas Neuro: Alert and oriented X 3.  Labs:   No visits with results within 1 Month(s) from this visit.  Latest known visit with results is:  Office Visit on 07/18/2023  Component Date Value Ref Range Status  . WBC 07/18/2023 4.4  3.6 - 11.2 10*9/L Final  . RBC 07/18/2023 4.94  3.95 - 5.13 10*12/L Final  . HGB 07/18/2023 12.8  11.3 - 14.9 g/dL Final  . HCT  95/85/7974 41.3  34.0 - 44.0 % Final  . MCV 07/18/2023 83.6  77.6 - 95.7 fL Final  . MCH 07/18/2023 25.9  25.9 - 32.4 pg Final  . MCHC 07/18/2023 30.9 (L)  32.0 - 36.0 g/dL Final  . RDW 95/85/7974 14.8  12.2 - 15.2 % Final  . MPV 07/18/2023 7.8  6.8 - 10.7 fL Final  . Platelet 07/18/2023 305  150 - 450 10*9/L Final  . Sodium 07/18/2023 144  135 - 145 mmol/L Final  . Potassium 07/18/2023 4.3  3.4 - 4.8 mmol/L Final  . Chloride 07/18/2023 106  98 - 107 mmol/L Final  . CO2 07/18/2023 27.0  20.0 - 31.0 mmol/L Final  . Anion Gap 07/18/2023 11  5 - 14 mmol/L Final  . BUN 07/18/2023 15  9 - 23 mg/dL Final  . Creatinine 95/85/7974 0.69  0.55 - 1.02 mg/dL Final  . BUN/Creatinine Ratio 07/18/2023 22   Final  . eGFR CKD-EPI (2021) Female 07/18/2023 >90  >=60 mL/min/1.25m2 Final   eGFR calculated with CKD-EPI 2021 equation in accordance with [employer] and [employer] of Nephrology Task Force recommendations.  . Glucose 07/18/2023 91  70 - 179 mg/dL Final  . Calcium 95/85/7974 10.4  8.7 - 10.4 mg/dL Final  . Albumin 95/85/7974 4.2  3.4 - 5.0 g/dL Final  . Total Protein 07/18/2023 7.5  5.7 - 8.2 g/dL Final  . Total Bilirubin 07/18/2023 0.7  0.3 - 1.2 mg/dL Final  . AST 95/85/7974 35 (H)  <=34 U/L Final  . ALT 07/18/2023 42  10 - 49 U/L Final  . Alkaline Phosphatase 07/18/2023 56  46 - 116 U/L Final  . Hemoglobin A1C 07/18/2023 5.6  4.8 - 5.6 % Final  . Estimated Average Glucose 07/18/2023 114  mg/dL Final  . Triglycerides 07/18/2023 85  0 - 150 mg/dL Final  . Cholesterol, Total 07/18/2023 154  <=200 mg/dL Final  . Cholesterol, HDL 07/18/2023 47  40 - 60 mg/dL Final  . Cholesterol, LDL, Calculated 07/18/2023 90  40 - 99 mg/dL Final   NHLBI Recommended Ranges, LDL Cholesterol, for Adults (20+yrs) (ATPIII), mg/dL Optimal              <899 Near Optimal        100-129 Borderline High     130-159 High                160-189 Very High            >=190 NHLBI Recommended Ranges,  LDL Cholesterol, for Children (2-19 yrs), mg/dL Desirable            <889 Borderline High  110-129 High                 >=130   . VLDL Cholesterol Cal 07/18/2023 17  11 - 41 mg/dL Final  . Chol/HDL Ratio 07/18/2023 3.3  1.0 - 4.5 Final  . Cholesterol, Non-HDL, Calculated 07/18/2023 107  70 - 130 mg/dL Final   Non-HDL Cholesterol Recommended Ranges (mg/dL) Optimal       <869 Near Optimal 130 - 159 Borderline High 160 - 189 High             190 - 219 Very High       >220   . FASTING 07/18/2023 Unknown   Final    Follow-up:   Return in about 2 months (around 03/27/2024) for Weight clinic F/U one slot..  I attest that I, [facility] RO, personally documented this note for [facility], MD.    LAURAINE J RO 01/26/2024   I attest that I have reviewed the note and that the components of the history of the present illness, the physical exam, and the assessment and plan documented were performed by me or were performed in my presence where I verified the documentation and performed (or re-performed) the exam and medical decision making.   LAURAINE JINNY HUTCH, MD       [1] Past Medical History: Diagnosis Date  . Arthritis   . Chronic back pain   . Clotting disorder (HHS-HCC)   . Depression   . Femur fracture, left (CMS-HCC)   . GERD (gastroesophageal reflux disease)   . Heart palpitations   . Hypertension   . Knee pain, bilateral   . Obesity (BMI 30-39.9)   . Osteoporosis   . Sleep apnea on cpap    cpap  [2] Past Surgical History: Procedure Laterality Date  . BREAST BIOPSY Left 2015   benign per pt  . JOINT REPLACEMENT    . KNEE SURGERY    . PR COLONOSCOPY FLX DX W/COLLJ SPEC WHEN PFRMD N/A 07/19/2017   Procedure: COLONOSCOPY, FLEXIBLE, PROXIMAL TO SPLENIC FLEXURE; DIAGNOSTIC, W/WO COLLECTION SPECIMEN BY BRUSH OR WASH;  Surgeon: Dorn Lynwood Lauth, MD;  Location: [facility];  Service: Gastroenterology  . PR HYSTEROSCOPY,W/ENDO BX Midline 05/19/2017   Procedure:  HYSTEROSCOPY, SURGICAL; WITH SAMPLING (BIOPSY) OF ENDOMETRIUM &/OR POLYPECTOMY, W/WO D&C;  Surgeon: Zachary Dallas Grounds, MD;  Location[facility] OR [facility];  Service: [facility] Primary Gynecology  . PR TOTAL KNEE ARTHROPLASTY Right 04/09/2021   Procedure: ARTHROPLASTY, KNEE, CONDYLE & PLATEAU; MEDIAL & LAT COMPARTMENT W/WO PATELLA RESURFACE (TOTAL KNEE ARTHROP);  Surgeon: Lonni Elsie Ditty, MD;  Location[facility] OR [facility];  Service: Orthopedics  . PR UPPER GI ENDOSCOPY,BIOPSY N/A 01/22/2019   Procedure: UGI ENDOSCOPY; WITH BIOPSY, SINGLE OR MULTIPLE;  Surgeon: Eleanor Dewey Sorrel, MD;  Location: [facility] PROCEDURES [place];  Service: Gastroenterology  . TUBAL LIGATION    [3] Social History Socioeconomic History  . Marital status: Single    Spouse name: None  . Number of children: None  . Years of education: None  . Highest education level: None  Occupational History  . Occupation: On disability  Tobacco Use  . Smoking status: Never    Passive exposure: Past  . Smokeless tobacco: Never  Vaping Use  . Vaping status: Never Used  Substance and Sexual Activity  . Alcohol use: Never  . Drug use: Never  . Sexual activity: Not Currently    Partners: Male    Birth control/protection: Post-menopausal  Other Topics Concern  . Exercise Yes    Comment: leg/walk  . Living Situation Yes    Comment: daughter  Social History Narrative   The patient alone lives in [place], [place] .  She is disabled.  She does not use tobacco, alcohol, or other drugs   She has worked as a LAWYER in past   She has 4 children, 27, 29, 34, 35: 3 of them live in [place]       Social Drivers of Health   Financial Resource Strain: Low Risk  (01/21/2024)   Overall Physicist, Medical Strain (CARDIA)   . Difficulty of Paying Living Expenses: Not hard at all  Food Insecurity: No Food Insecurity (01/21/2024)   Hunger Vital Sign   . Worried About Programme Researcher, Broadcasting/film/video in the Last Year: Never true   . Ran  Out of Food in the Last Year: Never true  Transportation Needs: No Transportation Needs (01/21/2024)   PRAPARE - Transportation   . Lack of Transportation (Medical): No   . Lack of Transportation (Non-Medical): No  Physical Activity: Insufficiently Active (06/26/2020)   Exercise Vital Sign   . Days of Exercise per Week: 3 days   . Minutes of Exercise per Session: 10 min  Housing: Low Risk  (01/21/2024)   Housing   . Within the past 12 months, have you ever stayed: outside, in a car, in a tent, in an overnight shelter, or temporarily in someone else's home (i.e. couch-surfing)?: No   . Are you worried about losing your housing?: No  [4] Family History Problem Relation Age of Onset  . Hypertension Mother   . Arrhythmia Mother        irregular heart beat  . Heart disease Mother        unclear diagnosis  . COPD Mother   . Neuropathy Mother   . Hypertension Father   . Aneurysm Father   . Alcohol abuse Father   . No Known Problems Sister   . No Known Problems Maternal Grandmother   . No Known Problems Maternal Grandfather   . No Known Problems Paternal Grandmother   . No Known Problems Paternal Grandfather   . No Known Problems Brother   . No Known Problems Maternal Aunt   . No Known Problems Maternal Uncle   . No Known Problems Paternal Aunt   . No Known Problems Paternal Uncle   . No Known Problems Other   . Anesthesia problems Neg Hx   . Broken bones Neg Hx   . Cancer Neg Hx   . Clotting disorder Neg Hx   . Collagen disease Neg Hx   . Diabetes Neg Hx   . Dislocations Neg Hx   . Fibromyalgia Neg Hx   . Gout Neg Hx   . Hemophilia Neg Hx   . Osteoporosis Neg Hx   . Rheumatologic disease Neg Hx   . Scoliosis Neg Hx   . Severe sprains Neg Hx   . Sickle cell anemia Neg Hx   . Spinal Compression Fracture Neg Hx   . Breast cancer Neg Hx   [5]  Current Outpatient Medications:  .  acetaminophen  (TYLENOL ) 500 MG tablet, Take 2 tablets (1,000 mg total) by mouth every eight  (8) hours as needed for pain (mild to moderate)., Disp: 90 tablet, Rfl: 0 .  amlodipine  (NORVASC ) 10 MG tablet, Take 1 tablet (10 mg total) by mouth daily., Disp: 100 tablet, Rfl: 3 .  celecoxib  (CELEBREX ) 100 MG capsule, Take  1 capsule (100 mg total) by mouth two (2) times a day., Disp: 60 capsule, Rfl: 2 .  cyclobenzaprine  (FLEXERIL ) 5 MG tablet, Take 1 tablet (5 mg total) by mouth every eight (8) hours as needed., Disp: 60 tablet, Rfl: 2 .  fluticasone  propionate (FLONASE ) 50 mcg/actuation nasal spray, 1 spray into each nostril daily., Disp: 16 g, Rfl: 0 .  hydrOXYzine  (ATARAX ) 25 MG tablet, Can take 1-2 tablets 3 times a day as needed for anxiety, Disp: 90 tablet, Rfl: 0 .  lidocaine  (XYLOCAINE ) 5 % ointment, Apply 2-4 grams(1-2 inch ribbon) to affected areas up to three times a day as needed for pain., Disp: 200 g, Rfl: 2 .  lisinopril-hydroCHLOROthiazide (PRINZIDE,ZESTORETIC) 20-12.5 mg per tablet, Take 1 tablet by mouth daily., Disp: 100 tablet, Rfl: 3 .  metFORMIN (GLUCOPHAGE) 500 MG tablet, TAKE 1 TABLET BY MOUTH TWICE  DAILY WITH MORNING AND EVENING  MEALS, Disp: 90 tablet, Rfl: 0 .  omeprazole (PRILOSEC) 20 MG capsule, Take 1 capsule (20 mg total) by mouth daily., Disp: 90 capsule, Rfl: 3 .  ondansetron  (ZOFRAN ) 4 MG tablet, Take 1 tablet (4 mg total) by mouth every eight (8) hours as needed for nausea for up to 14 doses., Disp: 14 tablet, Rfl: 0 .  pen needle, diabetic (INSUPEN PEN NEEDLE) 32 gauge x 1/4 (6 mm) Ndle, 1 each by Miscellaneous route once a week., Disp: 12 each, Rfl: 3 .  pregabalin  (LYRICA ) 100 MG capsule, Take 1 capsule (100 mg total) by mouth Three (3) times a day., Disp: 90 capsule, Rfl: 2 .  rOPINIRole  (REQUIP ) 0.5 MG tablet, Take 1 tablet (0.5 mg total) by mouth nightly., Disp: 90 tablet, Rfl: 3 .  semaglutide (OZEMPIC) 2 mg/dose (8 mg/3 mL) PnIj, Inject 2 mg under the skin every seven (7) days. OR use as directed by MD, Disp: 3 mL, Rfl: 2 [6] Current Outpatient  Medications  Medication Sig Dispense Refill  . acetaminophen  (TYLENOL ) 500 MG tablet Take 2 tablets (1,000 mg total) by mouth every eight (8) hours as needed for pain (mild to moderate). 90 tablet 0  . amlodipine  (NORVASC ) 10 MG tablet Take 1 tablet (10 mg total) by mouth daily. 100 tablet 3  . celecoxib  (CELEBREX ) 100 MG capsule Take 1 capsule (100 mg total) by mouth two (2) times a day. 60 capsule 2  . cyclobenzaprine  (FLEXERIL ) 5 MG tablet Take 1 tablet (5 mg total) by mouth every eight (8) hours as needed. 60 tablet 2  . fluticasone  propionate (FLONASE ) 50 mcg/actuation nasal spray 1 spray into each nostril daily. 16 g 0  . hydrOXYzine  (ATARAX ) 25 MG tablet Can take 1-2 tablets 3 times a day as needed for anxiety 90 tablet 0  . lidocaine  (XYLOCAINE ) 5 % ointment Apply 2-4 grams(1-2 inch ribbon) to affected areas up to three times a day as needed for pain. 200 g 2  . lisinopril-hydroCHLOROthiazide (PRINZIDE,ZESTORETIC) 20-12.5 mg per tablet Take 1 tablet by mouth daily. 100 tablet 3  . metFORMIN (GLUCOPHAGE) 500 MG tablet TAKE 1 TABLET BY MOUTH TWICE  DAILY WITH MORNING AND EVENING  MEALS 90 tablet 0  . omeprazole (PRILOSEC) 20 MG capsule Take 1 capsule (20 mg total) by mouth daily. 90 capsule 3  . ondansetron  (ZOFRAN ) 4 MG tablet Take 1 tablet (4 mg total) by mouth every eight (8) hours as needed for nausea for up to 14 doses. 14 tablet 0  . pen needle, diabetic (INSUPEN PEN NEEDLE) 32 gauge x 1/4 (6 mm) Ndle  1 each by Miscellaneous route once a week. 12 each 3  . pregabalin  (LYRICA ) 100 MG capsule Take 1 capsule (100 mg total) by mouth Three (3) times a day. 90 capsule 2  . rOPINIRole  (REQUIP ) 0.5 MG tablet Take 1 tablet (0.5 mg total) by mouth nightly. 90 tablet 3  . semaglutide (OZEMPIC) 2 mg/dose (8 mg/3 mL) PnIj Inject 2 mg under the skin every seven (7) days. OR use as directed by MD 3 mL 2   No current facility-administered medications for this visit.

## 2024-02-03 ENCOUNTER — Other Ambulatory Visit: Payer: Self-pay

## 2024-02-03 ENCOUNTER — Emergency Department
Admission: EM | Admit: 2024-02-03 | Discharge: 2024-02-03 | Disposition: A | Discharge status: Home / Self Care | Attending: Emergency Medicine | Admitting: Emergency Medicine

## 2024-02-03 ENCOUNTER — Encounter: Payer: Self-pay | Admitting: Emergency Medicine

## 2024-02-03 DIAGNOSIS — E119 Type 2 diabetes mellitus without complications: Secondary | ICD-10-CM | POA: Insufficient documentation

## 2024-02-03 DIAGNOSIS — M545 Low back pain, unspecified: Secondary | ICD-10-CM | POA: Diagnosis present

## 2024-02-03 DIAGNOSIS — I10 Essential (primary) hypertension: Secondary | ICD-10-CM | POA: Diagnosis not present

## 2024-02-03 DIAGNOSIS — M5442 Lumbago with sciatica, left side: Secondary | ICD-10-CM | POA: Diagnosis not present

## 2024-02-03 MED ORDER — MUSCLE RUB 10-15 % EX CREA: 1.0000 | TOPICAL_CREAM | CUTANEOUS | 0 refills | Status: AC | PRN

## 2024-02-03 MED ORDER — OXYCODONE HCL 5 MG PO TABS: 5.0000 mg | ORAL_TABLET | Freq: Three times a day (TID) | ORAL | 0 refills | Status: AC | PRN

## 2024-02-03 MED ORDER — OXYCODONE HCL 5 MG PO TABS
5.0000 mg | ORAL_TABLET | Freq: Once | ORAL | Status: AC
  Administered 2024-02-03: 5 mg via ORAL
  Filled 2024-02-03: qty 1

## 2024-02-03 MED ORDER — LIDOCAINE 5 % EX PTCH: 1.0000 | MEDICATED_PATCH | CUTANEOUS | 0 refills | Status: AC

## 2024-02-03 MED ORDER — DIAZEPAM 5 MG PO TABS: 5.0000 mg | ORAL_TABLET | Freq: Once | ORAL | Status: DC

## 2024-02-03 MED ORDER — LIDOCAINE 5 % EX PTCH
1.0000 | MEDICATED_PATCH | CUTANEOUS | Status: DC
  Administered 2024-02-03: 1 via TRANSDERMAL
  Filled 2024-02-03: qty 1

## 2024-02-03 MED ORDER — ACETAMINOPHEN 500 MG PO TABS
1000.0000 mg | ORAL_TABLET | Freq: Once | ORAL | Status: AC
  Administered 2024-02-03: 1000 mg via ORAL
  Filled 2024-02-03: qty 2

## 2024-02-03 MED ORDER — DEXAMETHASONE SOD PHOSPHATE PF 10 MG/ML IJ SOLN
10.0000 mg | Freq: Once | INTRAMUSCULAR | Status: AC
  Administered 2024-02-03: 10 mg via INTRAMUSCULAR

## 2024-02-03 MED ORDER — KETOROLAC TROMETHAMINE 30 MG/ML IJ SOLN
30.0000 mg | Freq: Once | INTRAMUSCULAR | Status: AC
  Administered 2024-02-03: 30 mg via INTRAMUSCULAR
  Filled 2024-02-03: qty 1

## 2024-02-03 MED ORDER — METHOCARBAMOL 500 MG PO TABS: 500.0000 mg | ORAL_TABLET | Freq: Four times a day (QID) | ORAL | 0 refills | Status: AC

## 2024-02-03 NOTE — Discharge Instructions (Addendum)
 Take acetaminophen  650 mg and ibuprofen  400 mg every 6 hours for pain.  Take with food. Use lidocaine  patches or the muscle cream as prescribed which can help with back pain as well. Take methocarbamol  muscle relaxant as prescribed (warning: Do not take this medication if you take baclofen )  For severe pain only, you may take oxycodone  as prescribed.  Follow-up with your primary doctor.  If you do not have a spine specialist, you can call Dr. Reeves Nine for an appointment  Thank you for choosing us  for your health care today!  Please see your primary doctor this week for a follow up appointment.   If you have any new, worsening, or unexpected symptoms call your doctor right away or come back to the emergency department for reevaluation.  It was my pleasure to care for you today.   Ginnie EDISON Cyrena, MD

## 2024-02-03 NOTE — ED Triage Notes (Addendum)
 Pt arrives ambulatory to triage, gait steady but slow, no acute distress noted c/o low back pain that started 02/01/24 after carrying a heavy bag, pain is worse. Pt took tylenol  and muscle relaxer around 1800, no relief. Pt reports pain is now radiating down left leg, that started today. Pain described as burning.

## 2024-02-03 NOTE — ED Provider Notes (Addendum)
 [facility] Provider Note    Event Date/Time   First MD Initiated Contact with Patient 02/03/24 [phone]     (approximate)   History   Back Pain   HPI  Ariel Davis is a 58 y.o. female   Past medical history of chronic back pain left-sided with sciatica, diabetes, hypertension, sleep apnea who presents to the emergent apartment with left-sided lower back pain with sciatica down the left leg.  Similar to her sciatica exacerbations in the past this time exacerbated by lifting a heavy bag 2 days ago.  Denies abdominal pain, motor or sensory changes, incontinence.  No urinary symptoms.  External Medical Documents Reviewed: Prior outpatient notes      Physical Exam   Triage Vital Signs: ED Triage Vitals [02/03/24 0034]  Encounter Vitals Group     BP 133/73     Girls Systolic BP Percentile      Girls Diastolic BP Percentile      Boys Systolic BP Percentile      Boys Diastolic BP Percentile      Pulse Rate 76     Resp 18     Temp 98.8 F (37.1 C)     Temp Source Oral     SpO2 100 %     Weight      Height      Head Circumference      Peak Flow      Pain Score 10     Pain Loc      Pain Education      Exclude from Growth Chart     Most recent vital signs: Vitals:   02/03/24 0034 02/03/24 0511  BP: 133/73 113/69  Pulse: 76 (!) 52  Resp: 18 16  Temp: 98.8 F (37.1 C) 97.8 F (36.6 C)  SpO2: 100% 95%    General: Awake, no distress.  CV:  Good peripheral perfusion.  Resp:  Normal effort.  Abd:  No distention.  Other:  Point tenderness to the left paraspinal lumbar area, lower.  Motor and sensory intact bilateral lower extremities.  Soft benign abdominal exam.  No skin rash to the affected area.   ED Results / Procedures / Treatments   Labs (all labs ordered are listed, but only abnormal results are displayed) Labs Reviewed - No data to display    PROCEDURES:  Critical Care performed: No  Procedures   MEDICATIONS ORDERED  IN ED: Medications  lidocaine  (LIDODERM ) 5 % 1 patch (1 patch Transdermal Patch Applied 02/03/24 0519)  oxyCODONE  (Oxy IR/ROXICODONE ) immediate release tablet 5 mg (has no administration in time range)  ketorolac  (TORADOL ) 30 MG/ML injection 30 mg (30 mg Intramuscular Given 02/03/24 0513)  dexamethasone  (DECADRON ) injection 10 mg (10 mg Intramuscular Given 02/03/24 0514)  acetaminophen  (TYLENOL ) tablet 1,000 mg (1,000 mg Oral Given 02/03/24 0512)    IMPRESSION / MDM / ASSESSMENT AND PLAN / ED COURSE  I reviewed the triage vital signs and the nursing notes.                                Patient's presentation is most consistent with acute presentation with potential threat to life or bodily function.  Differential diagnosis includes, but is not limited to, back pain with sciatica, considered but less likely cord compression, fractures, kidney stones, intra-abdominal infections or shingles   MDM:    Pain consistent with her longstanding back pain with sciatica exacerbated  by lifting a heavy bag, considered alternative diagnoses here as above but unlikely.  No evidence of cord compression.  Will give multimodal pain control, reassess and anticipate discharge with outpatient follow-up.  Reassessed, pain improving.  Appropriate for discharge.      FINAL CLINICAL IMPRESSION(S) / ED DIAGNOSES   Final diagnoses:  Acute left-sided low back pain with left-sided sciatica     Rx / DC Orders   ED Discharge Orders          Ordered    lidocaine  (LIDODERM ) 5 %  Every 24 hours        02/03/24 0526    oxyCODONE  (ROXICODONE ) 5 MG immediate release tablet  Every 8 hours PRN        02/03/24 0526    methocarbamol  (ROBAXIN ) 500 MG tablet  4 times daily        02/03/24 9473             Note:  This document was prepared using Dragon voice recognition software and may include unintentional dictation errors.    Cyrena Mylar, MD 02/03/24 9472    Cyrena Mylar, MD 02/03/24 [phone]

## 2024-02-22 NOTE — Therapy (Signed)
 OUTPATIENT PHYSICAL THERAPY EVALUATION   Patient Name: Ariel Davis MRN: [id number] DOB:April 25, 1965, 58 y.o., female Today's Date: 02/23/2024  END OF SESSION:  PT End of Session - 02/23/24 1630     Visit Number 1    Number of Visits 17    Date for Recertification  05/17/24    Authorization Type [employer]ICARE reporting period from 02/23/2024    Progress Note Due on Visit 10    PT Start Time 1521    PT Stop Time 1610    PT Time Calculation (min) 49 min    Activity Tolerance Patient limited by pain    Behavior During Therapy WFL for tasks assessed/performed          Past Medical History:  Diagnosis Date   Anemia    Anxiety    Arthritis    Chronic back pain    Diabetes mellitus without complication (HCC)    H/O degenerative disc disease    Hypertension    Peroneal tendon rupture, right, initial encounter 2025   Sciatica    Sleep apnea    cpap   Past Surgical History:  Procedure Laterality Date   breast bioosy     CHOLECYSTECTOMY     ENDOSCOPIC RETROGRADE CHOLANGIOPANCREATOGRAPHY (ERCP) WITH PROPOFOL  N/A 06/17/2021   Procedure: ENDOSCOPIC RETROGRADE CHOLANGIOPANCREATOGRAPHY (ERCP) WITH PROPOFOL ;  Surgeon: Jinny Carmine, MD;  Location: [facility];  Service: Endoscopy;  Laterality: N/A;   HYSTEROSCOPY     INTRAOPERATIVE CHOLANGIOGRAM N/A 06/16/2021   Procedure: INTRAOPERATIVE CHOLANGIOGRAM;  Surgeon: Rodolph Romano, MD;  Location: [facility];  Service: General;  Laterality: N/A;   LIGAMENT REPAIR Right 07/22/2023   Procedure: REPAIR, LIGAMENT;  Surgeon: Ashley Soulier, DPM;  Location: [facility];  Service: Orthopedics/Podiatry;  Laterality: Right;   TOTAL KNEE ARTHROPLASTY Right 04/09/2021   Patient Active Problem List   Diagnosis Date Noted   Obesity, Class III, BMI 40-49.9 (morbid obesity) (HCC) 06/18/2021   Acute cholecystitis 06/17/2021   Choledocholithiasis    Elevated LFTs 06/15/2021   Gallstones    Jaundice    Arthritis of right knee  01/31/2018   DDD (degenerative disc disease), lumbar 01/31/2018   Pain medication agreement signed 01/31/2018   Chronic back pain 01/02/2018   Pain in both feet 08/31/2017   Knee pain, bilateral 07/05/2017   Depression 06/07/2017   OSA (obstructive sleep apnea) 05/12/2017   Closed nondisplaced fracture of anterior wall of left acetabulum (HCC) 08/27/2016   Localized osteoarthritis of right knee 08/27/2016   Closed fracture of posterior wall of left acetabulum (HCC) 08/23/2016   Essential hypertension 06/21/2016   Allergic rhinitis 12/06/2013   Microcytic hypochromic anemia 12/06/2013    PCP: Gayle Verneita PARAS, NP  REFERRING PROVIDER: Curtistine DOROTHA Crandall, DO  REFERRING DIAG:  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 Patient has a history of an acetabular fracture (2018). MRI shows partial tear at insertion of glute med (confirmed on L and suspected on R). She has previously gotten relief from back pain but not hip pain with lumbar injections.   What are your expectations for today? To start the exercise to help it work.  Manner of onset (traumatic, sudden, insidious): Patient states this problem happened about 3 years ago and it started getting worse and worse and worse. She fell 2018 and fractured her acetabulum. It recently got worse about 3 months ago. It gradually got worse over a month or more.  Related signs and symptoms: numbness and burning down left leg, weakness in left leg.  Previous episodes: prior history of low back pain with left sided radiation and she thinks this also started when she fell in  2018 (nerve ablation in 07/2022 helped a lot, but her back pain returned about a year ago), pain in left hip after she broke her hip in 2018.  Occupation: on disability for low back Recreational Activities and hobbies: get in the yard, cleaning the yard, walking, going to the gym and exercise in the water (has not been going since this pain started - the last 3 months). Functional limitations: difficulty with usual activities including basic mobility, exercising, walking, standing, homemaking, yardwork, lifting, social life, sleeping, standing, traveling.   PERTINENT HISTORY: Patient is a 58 y.o. 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 This patient's chief complaints consist of left lateral hip pain radiating down left leg with numbness and tingling under foot, and also radiating at low back, leading to the following functional deficits: difficulty with usual activities including basic mobility, exercising, walking, standing, homemaking, yardwork, lifting, social life, sleeping, standing, traveling. Relevant past medical history and comorbidities include the following: she has Allergic rhinitis; Arthritis of right knee; Chronic back pain; Closed fracture of posterior wall of left acetabulum (HCC); Closed nondisplaced fracture of anterior wall of left acetabulum (HCC); DDD (degenerative disc disease), lumbar; Depression; Essential hypertension; Knee pain, bilateral; Localized osteoarthritis of right knee; Microcytic hypochromic anemia; OSA (obstructive sleep apnea); Pain in both feet; Pain medication agreement signed; Elevated LFTs; Gallstones; Jaundice; Choledocholithiasis; and Obesity, Class III, BMI 40-49.9 (morbid obesity) (HCC) on their problem list. she  has a past medical history of Anemia, Anxiety, Arthritis, Chronic back pain, Diabetes mellitus (HCC), H/O degenerative disc disease,  Hypertension, Peroneal tendon rupture, right, initial encounter (2025), Sciatica, and Sleep apnea. she  has a past surgical history that includes Total knee arthroplasty (Right, 04/09/2021); breast bioosy; lumbar osteopenia (2023); Hysteroscopy; Intraoperative cholangiogram (N/A, 06/16/2021); Cholecystectomy; and Repair peroneal longus tendon with tubularization and anastomosis to peroneus brevis tendon 2. Tenolysis peroneus brevis tendon right lateral ankle (Right, 07/22/2023). Patient denies hx of cancer, stroke, seizures, lung problems, heart problems, unexplained weight loss, and spinal surgery. She states she urinates a lot. This has been going on for 4-5 months. She has not spoken to her doctors about it. She states she cannot hold her urine long if she has an urge to go. She states her hands cramp up and she drops things sometimes. She states her blood is not circulating like it should in the left leg. She states her left hand goes to sleep all the time too.   PAIN: Are you having pain? Yes NPRS: Current: 7/10 not that bad,  Best: 7/10, Worst: 10/10, Worst in the last 2 weeks: 10/10 (went to hospital).  Pain location: left glute Pain description: constant with burning (before injection) at left glute, intermittent radiation down left leg to bottom of foot. Also goes up towards the back. Numbness/tingling: left leg and foot. Lateral ankle and foot on the right.  Aggravating factors: jumping around in the pool, standing up  too long, sitting down too long Relieving factors: injection 3 days ago (less burning), heat helped at first, ice helped at first 24 hour clock: no time of day is better  PRECAUTIONS: Fall, not supposed to lift over 5-10 lbs.   WEIGHT BEARING RESTRICTIONS: No  FALLS:  Has patient fallen in last 6 months? Yes. Number of falls 1, she was coming in the house and she is not sure if she fell over the threshold. She fell on the left side.  She has a cane and walker in the car and  she uses those when her pain is at it's worst.   LIVING ENVIRONMENT: Lives with: alone Lives in: single story home Stairs: 4 steps with bilateral handrails that can be reached at the same time.  Has following equipment at home: Single point cane and Walker - 4 wheeled  PLOF: going to the gym, walking (I stayed at the track), liked water aerobics, and loved yardwork. Was not limited in her ability to do these things.   PATIENT GOALS: to get better and to be able to do most of the things she used to do  OBJECTIVE  DIAGNOSTIC FINDINGS:  R hip MRI report from 02/22/2024: EXAM: MRI LOWER EXTREMITY JOINT LEFT WO CONTRAST  DATE: 01/07/2024 10:38 AM  ACCESSION: [id number]  DICTATED: 01/07/2024 12:38 PM   CLINICAL INDICATION: 58 years old Female with history of left acetabular fracture following trauma, acute on chronic left hip pain   COMPARISON: Hip radiographs 11/21/2023   TECHNIQUE: MRI of the right hip was performed without administration of intravenous contrast.    FINDINGS:   Bones: Lower lumbar spondylosis. Sclerosis along the anterior aspect of the left acetabulum anterior and superior to the left femoral acetabular joint, sequela of prior fracture. No new fracture line or acute osseous stress reaction.  Right hip: Subtle femoral head and acetabular osteophytosis.  Left hip (large FOV): Mild degenerative changes characterized by marginal osteophytosis, joint space narrowing, and mild subchondral cystic change. Small effusion.  Sacroiliac joints: Mild degenerative changes present.  Pubic symphysis: Mild degenerative changes present with small joint effusion and without acute fracture. Normally aligned.   Soft tissues: The hamstring origins and tendon insertions are intact. Partial tear at the left gluteus medius insertion. Suspect partial tear at right gluteus medius insertion as well. No significant trochanteric bursal fluid accumulation. Muscles unremarkable. Unremarkable  neurovascular bundles.   Intraperitoneal:No intraperitoneal organized collection or free fluid. Fibroid uterus.   IMPRESSION:   1. No acute fracture or malalignment.   2.  Mild left worse than right hip degenerative changes, remote/healed left acetabular fracture.   3.  Left gluteus medius insertional partial tear. Suspect similar partial tear on the right side on large field-of-view images.   Lumbar xray report from 01/26/2022 EXAM: XR LUMBAR SPINE AP LATERAL FLEXION AND EXTENSION  DATE: 01/26/2022 9:43 AM  ACCESSION: [id number] UN  DICTATED: 01/26/2022 10:43 AM  INTERPRETATION LOCATION: MAIN CAMPUS   CLINICAL INDICATION: 58 years old Female with Evaluate for instability  - M48.061 - Spinal stenosis of lumbar region without neurogenic claudication   COMPARISON: Lumbar spine radiograph dated 09/28/2017.   TECHNIQUE: AP, lateral neutral, lateral flexion, and lateral extension views of the lumbar spine.   FINDINGS:  There are 5 nonrib-bearing lumbar-type vertebral bodies.   Grade 1 anterolisthesis of L3 on L4, L4 and L5. No translation on lateral extension and flexion radiographs. Subtle intervertebral disc base height loss greatest at L3-L4. Multilevel facet arthrosis. Moderate osseous neural foraminal narrowing  at L3-L4 and L5-S1. Slight levocurvature of the lumbar spine centered at L3-L4. SI joints are aligned.   IMPRESSION:  Grade 1 anterolisthesis of L3 on L4 and L4 on L5. No dynamic listhesis.  Moderate degenerative disc disease of the lower lumbar spine with probable osseous neural foraminal narrowing at L3-L4 and L5-S1.   SELF-REPORTED FUNCTION MODIFIED OSWESTRY DISABILITY SCALE  Date: 02/23/24 Score  Pain intensity 1 = The pain is bad, but I can manage without having to take pain medication  2. Personal care (washing, dressing, etc.) 0 =  I can take care of myself normally without causing increased pain.  3. Lifting 5 =  I cannot lift or carry anything at all.  4.  Walking 1 = Pain prevents me from walking more than 1 mile.  5. Sitting 2 =  Pain prevents me from sitting more than 1 hour.  6. Standing 2 =  Pain prevents me from standing more than 1 hour  7. Sleeping 2 =  Even when I take pain medication, I sleep less than 6 hours  8. Social Life 4 =  Pain has restricted my social life to my home  9. Traveling 3 = My pain restricts my travel over 1 hour  10. Employment/ Homemaking 2 = I can perform most of my homemaking/job duties, but pain prevents me from performing more physically stressful activities (eg, lifting, vacuuming).  Total 22/50 (44%)   Interpretation of scores: Score Category Description  0-20% Minimal Disability The patient can cope with most living activities. Usually no treatment is indicated apart from advice on lifting, sitting and exercise  21-40% Moderate Disability The patient experiences more pain and difficulty with sitting, lifting and standing. Travel and social life are more difficult and they may be disabled from work. Personal care, sexual activity and sleeping are not grossly affected, and the patient can usually be managed by conservative means  41-60% Severe Disability Pain remains the main problem in this group, but activities of daily living are affected. These patients require a detailed investigation  61-80% Crippled Back pain impinges on all aspects of the patient's life. Positive intervention is required  81-100% Bed-bound These patients are either bed-bound or exaggerating their symptoms  Bluford FORBES Zoe DELENA Karon DELENA, et al. Surgery versus conservative management of stable thoracolumbar fracture: the PRESTO feasibility RCT. [place] ([place]): [employer]; 2021 Nov. [facility] Technology Assessment, No. 25.62.) Appendix 3, Oswestry Disability Index category descriptors. Available from: Findjewelers.cz  Minimally Clinically Important Difference (MCID) =  12.8%  STANDING Observation Posture Seated: holds left knee extended,  Standing: flexed forwards at hips with flattened lumbar spine, offloades weight from L LE, and stands with L heel slightly lifted and knee and hip flexed, pronounced L genuvalgus.  Movement patterns and painful movements Sit <> stand: slow and careful with heavy UE use, difficulty standing up straight. Bed mobility: sit to supine rotates to long sitting and lays back with considerable complaint of pain with slow effortful movement.  Gait: ambulates in stooped position with flexion from the hips and low back, with very antalgic gait on the left.   AROM Lumbar AROM Flexion: fingers to mid shin, central low back pain increased Extension: < 20%, feels good, decreased concordant L hip pain, no better Side Flexion: R: 40% right lumbar pulling L: <10%, increased concordant L hip and leg symptoms, also shifted weight to left hip (shift of weight bearing to L LE without L sidebend uncomfortable but not nearly as painful as  L sidebend) Rotation: R: 50% L: 50% Traction Alleviation Test No effect SUPINE Neurological Testing Neural Tension Testing Straight Leg Raise:  From supine R: positive for tension at low back and thigh at approx 45 degrees, sensitive to foot position.  L: positive (earlier ROM) with strong concordant hip pain that is sensitive to foot position With pre-flexed hip/knee R: positive for tension in posterior thigh and low back, more intense than L but near same ROM L: just nerve tension in posterior thigh and back at the same point as R, but not as intense.  Reflex Testing Patellar Reflex (L3-L4):  R: 0+ L: 0+  Peripheral Joint AROM/PROM Hip  Flexion R: PROM: WNL End feel: tissue approximation L: PROM: less than R End feel: empty/guarded with severe concordant lateral hip pain External rotation At 90 degrees hip flexion R:  PROM: WNL End feel: WNL L: PROM: more limited than R, painful at  lateral hip End feel: guarded Internal rotation At 90 degrees hip flexion R:  PROM: WFL End feel: WNL L: PROM: WFL but relieved some concordant pain End feel: slightly guarded L LE increased tingling after testing  Special Tests Hip ER derotation test (for GTPS) R: WNL L: painful but not severe FADDIR R: positive for groin pain, no worse L: severe pain at posterior/lateral hip Resistive Testing (isometric make tests) Hip (in hooklying)  Flexion at 45 degrees R: not painful L: not painful Adduction  R: not painful L: not painful Abduction  R: not painful L: not painful Extension (attempted double leg bridge) Severe pain in left side followed by cramping in L hamstring.  Traction Alleviation Significant decrease in concordant L hip pain with manual lumbar traction.                                                                                                                               TREATMENT Manual therapy: to reduce pain and tissue tension, improve range of motion, neuromodulation, in order to promote improved ability to complete functional activities. HOOKLYING Intermittent manual lumbar traction with belt around back of knees 10x10 seconds on/off  Therapeutic activities: dynamic therapeutic activities incorporating MULTIPLE parameters or areas of the body designed to achieve improved functional performance. Supine <> sit x2 with log roll technique Added to HEP  Pt required multimodal cuing for proper technique and to facilitate improved neuromuscular control, strength, range of motion, and functional ability resulting in improved performance and form.  PATIENT EDUCATION:  Education details: Exercise purpose/form. Self management techniques. Education on diagnosis, prognosis, POC, anatomy and physiology of current condition. Education on HEP including handout. Person educated: Patient Education method: Explanation, Demonstration, and Handouts Education  comprehension: verbalized understanding, returned demonstration, and needs further education  HOME EXERCISE PROGRAM: Access Code: [email or web address] URL: [email or web address] Date: 02/23/2024 Prepared by: Camie Cleverly  Exercises - Sitting to Supine Roll    ASSESSMENT:  CLINICAL IMPRESSION: Patient is a 58 y.o. 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 Patient had relief of concordant L hip pain with lumbar unloading, pain was reproduced with SLR test, sensitive to foot position, concordant pain was strongly reproduced with left sidebending at the low back. Patient has accompanying numbness and tingling down the L leg, which is not a common finding in extra-articular hip conditions. She reported the improvement from the RFA did not last beyond about 1 year ago. Hip exam did not reproduce intra-articular pain but was limited by guarding. She did report some improvement with ultrasound-guided steroid injection to the left glutal tendons earlier this week but still experienced breathtaking concordant pain today during examination in the clinic. She has radiographic evidence of L and likely R glute med tears and tendinopathy, and this will be addressed in PT along with GTPS and lumbar sources of left hip pain. She appears to be sensitive to the following mechanical stressors at the lumbar spine: compressive load, left sidebending, neural tension. She appears at risk for falls and would likely benefit from using a RW at all times to help unload the spine as she ambulates with stooped posture that increases load on the low back. Patient presents with significant pain, ROM, muscle guarding, paresthesia, nerve tension, joint stiffness, posture, gait, muscle  performance (strength/power/endurance), knowledge, balance, and activity tolerance impairments that are limiting ability to complete usual activities including basic mobility, exercising, walking, standing, homemaking, yardwork, lifting, social life, sleeping, standing, traveling without difficulty. Patient will benefit from skilled physical therapy intervention to address current body structure impairments and activity limitations to improve function and work towards goals set in current POC in order to return to prior level of function or maximal functional improvement.   Mechanical sensitivities: lumbar load, neural tension, left lumbar sidebending  OBJECTIVE IMPAIRMENTS: Abnormal gait, decreased activity tolerance, decreased balance, decreased coordination, decreased endurance, decreased knowledge of condition, decreased knowledge of use of DME, decreased mobility, difficulty walking, decreased ROM, decreased strength, hypomobility, increased muscle spasms, impaired flexibility, improper body mechanics, postural dysfunction, obesity, and pain.   ACTIVITY LIMITATIONS: carrying, lifting, bending, sitting, standing, squatting, sleeping, stairs, transfers, bed mobility, dressing, hygiene/grooming, and locomotion level  PARTICIPATION LIMITATIONS: meal prep, cleaning, laundry, community activity, and yard work  PERSONAL FACTORS: Age, limited success in PT for similar pain in the past, Fitness, Past/current experiences, Time since onset of injury/illness/exacerbation, and 3+ comorbidities: allergic rhinitis; Arthritis of right knee; Chronic back pain; Closed fracture of posterior wall of left acetabulum (HCC); Closed nondisplaced fracture of anterior wall of left acetabulum (HCC); DDD (degenerative disc disease), lumbar; Depression; Essential hypertension; Knee pain, bilateral; Localized osteoarthritis of right knee; Microcytic hypochromic anemia; OSA (obstructive sleep apnea); Pain in both feet; Pain  medication agreement signed; Elevated LFTs; Gallstones; Jaundice; Choledocholithiasis; and Obesity, Class III, BMI 40-49.9 (morbid obesity) (HCC) on their problem list. she  has a past medical history of Anemia, Anxiety, Arthritis, Chronic back pain, Diabetes mellitus (HCC), H/O degenerative disc disease, Hypertension, Peroneal tendon rupture, right, initial encounter (2025), Sciatica, and Sleep apnea. she  has a past surgical history that includes Total knee arthroplasty (Right, 04/09/2021); breast bioosy; lumbar osteopenia (2023); Hysteroscopy; Intraoperative cholangiogram (N/A, 06/16/2021); Cholecystectomy; and Repair peroneal longus tendon with tubularization and anastomosis to peroneus brevis tendon 2. Tenolysis peroneus brevis tendon right lateral ankle (Right, 07/22/2023) are also  affecting patient's functional outcome.   REHAB POTENTIAL: Fair due to limited improvements from PT for similar conditions in the past  CLINICAL DECISION MAKING: Evolving/moderate complexity  EVALUATION COMPLEXITY: Moderate   GOALS: Goals reviewed with patient? No  SHORT TERM GOALS: Target date: 03/08/2024  Patient will be independent with initial home exercise program for self-management of symptoms. Baseline: Initial HEP provided at IE (02/23/24); Goal status: INITIAL  LONG TERM GOALS: Target date: 05/17/2024  Patient will be independent with a long-term home exercise program for self-management of symptoms.  Baseline: Initial HEP provided at IE (02/23/24); Goal status: INITIAL  2.  Patient will demonstrate improved Modified Oswestry Disability Index (mODI) to equal or less than 30% to demonstrate improvement in overall condition and self-reported functional ability.  Baseline: 44% (02/23/24); Goal status: INITIAL  3.  Patient will complete 5 Time Sit To Stand in equal or less than 15 seconds with no UE support from 18.5 inch or less surface to demonstrate improved LE strength, power, and balance for  functional mobility.  Baseline: unable to stand up without B UE support (02/23/24); Goal status: INITIAL  4.  Patient will ambulate equal or greater than 1300 feet during 6 Minute Walk Test with LRAD to improve her community mobility.  Baseline: difficulty walking to/from clinic with no AD, to be formally tested at later date (02/23/24); Goal status: INITIAL  5.  Patient will demonstrate improvement in Patient Specific Functional Scale (PSFS) of equal or greater than 8/10 points to reflect clinically significant improvement in patient's most valued functional activities. Baseline: to be measured at visit 2 as appropriate (02/23/24); Goal status: INITIAL  6.  Patient will report NPRS equal or less than 3/10 during functional activities during the last 2 weeks to improve their abilitly to complete community, work and/or recreational activities with less limitation. Baseline: 10/10 (02/23/24); Goal status: INITIAL   PLAN:  PT FREQUENCY: 2x/week  PT DURATION: 12 weeks  PLANNED INTERVENTIONS: 97164- PT Re-evaluation, 97750- Physical Performance Testing, 97110-Therapeutic exercises, 97530- Therapeutic activity, [id number]- Neuromuscular re-education, 97535- Self Care, 02859- Manual therapy, [phone]- Gait training, [phone]- Electrical stimulation (unattended), [phone]- Ionotophoresis 4mg /ml Dexamethasone , 79439 (1-2 muscles), 20561 (3+ muscles)- Dry Needling, Patient/Family education, Cryotherapy, and Moist heat  PLAN FOR NEXT SESSION: PSFS, BP, isometric hip strength, update HEP as appropriate, education on mechanical stressors and modifications of activities to avoid them, recover motor control and awareness of modifiers to mechanical sensitivities, retrain motor patterns, regain ROM, improve strength and resilience needed for  performing desired functional performance with appropriate ROM, strength, power, and endurance. Manual therapy as needed.   Camie SAUNDERS. Juli, PT, DPT, Cert. MDT 02/23/24, 6:21  PM  [facility] [address] [place], [place] [address] P: [phone] I F: [phone]

## 2024-02-23 ENCOUNTER — Encounter: Payer: Self-pay | Admitting: Physical Therapy

## 2024-02-23 ENCOUNTER — Ambulatory Visit: Admitting: Physical Therapy

## 2024-02-23 DIAGNOSIS — M792 Neuralgia and neuritis, unspecified: Secondary | ICD-10-CM | POA: Insufficient documentation

## 2024-02-23 DIAGNOSIS — R2689 Other abnormalities of gait and mobility: Secondary | ICD-10-CM | POA: Diagnosis present

## 2024-02-23 DIAGNOSIS — Z9181 History of falling: Secondary | ICD-10-CM | POA: Insufficient documentation

## 2024-02-23 DIAGNOSIS — M25552 Pain in left hip: Secondary | ICD-10-CM | POA: Diagnosis present

## 2024-02-23 DIAGNOSIS — M5459 Other low back pain: Secondary | ICD-10-CM | POA: Insufficient documentation

## 2024-02-27 ENCOUNTER — Ambulatory Visit: Admitting: Physical Therapy

## 2024-02-27 ENCOUNTER — Encounter: Payer: Self-pay | Admitting: Physical Therapy

## 2024-02-27 VITALS — BP 132/56 | HR 83

## 2024-02-27 DIAGNOSIS — Z9181 History of falling: Secondary | ICD-10-CM

## 2024-02-27 DIAGNOSIS — M792 Neuralgia and neuritis, unspecified: Secondary | ICD-10-CM

## 2024-02-27 DIAGNOSIS — M25552 Pain in left hip: Secondary | ICD-10-CM

## 2024-02-27 DIAGNOSIS — R2689 Other abnormalities of gait and mobility: Secondary | ICD-10-CM

## 2024-02-27 DIAGNOSIS — M5459 Other low back pain: Secondary | ICD-10-CM

## 2024-02-27 NOTE — Therapy (Signed)
 OUTPATIENT PHYSICAL THERAPY TREATMENT   Patient Name: Ariel Davis MRN: [id number] DOB:1965/11/01, 58 y.o., female Today's Date: 02/27/2024  END OF SESSION:  PT End of Session - 02/27/24 1510     Visit Number 2    Number of Visits 17    Date for Recertification  05/17/24    Authorization Type [employer] MEDICARE reporting period from 02/23/2024    Progress Note Due on Visit 10    PT Start Time 1515    PT Stop Time 1608    PT Time Calculation (min) 53 min    Activity Tolerance Patient limited by pain    Behavior During Therapy WFL for tasks assessed/performed           Past Medical History:  Diagnosis Date   Anemia    Anxiety    Arthritis    Chronic back pain    Diabetes mellitus without complication (HCC)    H/O degenerative disc disease    Hypertension    Peroneal tendon rupture, right, initial encounter 2025   Sciatica    Sleep apnea    cpap   Past Surgical History:  Procedure Laterality Date   breast bioosy     CHOLECYSTECTOMY     ENDOSCOPIC RETROGRADE CHOLANGIOPANCREATOGRAPHY (ERCP) WITH PROPOFOL  N/A 06/17/2021   Procedure: ENDOSCOPIC RETROGRADE CHOLANGIOPANCREATOGRAPHY (ERCP) WITH PROPOFOL ;  Surgeon: Jinny Carmine, MD;  Location: [facility] ENDOSCOPY;  Service: Endoscopy;  Laterality: N/A;   HYSTEROSCOPY     INTRAOPERATIVE CHOLANGIOGRAM N/A 06/16/2021   Procedure: INTRAOPERATIVE CHOLANGIOGRAM;  Surgeon: Rodolph Romano, MD;  Location: [facility] ORS;  Service: General;  Laterality: N/A;   LIGAMENT REPAIR Right 07/22/2023   Procedure: REPAIR, LIGAMENT;  Surgeon: Ashley Soulier, DPM;  Location: [facility] ORS;  Service: Orthopedics/Podiatry;  Laterality: Right;   TOTAL KNEE ARTHROPLASTY Right 04/09/2021   Patient Active Problem List   Diagnosis Date Noted   Obesity, Class III, BMI 40-49.9 (morbid obesity) (HCC) 06/18/2021   Acute cholecystitis 06/17/2021   Choledocholithiasis    Elevated LFTs 06/15/2021   Gallstones    Jaundice    Arthritis of right knee  01/31/2018   DDD (degenerative disc disease), lumbar 01/31/2018   Pain medication agreement signed 01/31/2018   Chronic back pain 01/02/2018   Pain in both feet 08/31/2017   Knee pain, bilateral 07/05/2017   Depression 06/07/2017   OSA (obstructive sleep apnea) 05/12/2017   Closed nondisplaced fracture of anterior wall of left acetabulum (HCC) 08/27/2016   Localized osteoarthritis of right knee 08/27/2016   Closed fracture of posterior wall of left acetabulum (HCC) 08/23/2016   Essential hypertension 06/21/2016   Allergic rhinitis 12/06/2013   Microcytic hypochromic anemia 12/06/2013    PCP: Gayle Verneita PARAS, NP  REFERRING PROVIDER: Curtistine DOROTHA Crandall, DO  REFERRING DIAG:  greater trochanteric pain syndrome of the left lower extremity  THERAPY DIAG:  Pain in left hip  Other low back pain  Neuralgia and neuritis  Other abnormalities of gait and mobility  History of falling  Rationale for Evaluation and Treatment: Rehabilitation  ONSET DATE: 2018  SUBJECTIVE:   PERTINENT HISTORY: Patient is a 58 y.o. 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 This patient's chief complaints consist of left lateral hip pain radiating down left leg with numbness and tingling under foot, and also radiating at low back, leading to the following functional deficits: difficulty with usual activities including basic mobility, exercising, walking, standing, homemaking, yardwork, lifting,  social life, sleeping, standing, traveling. Relevant past medical history and comorbidities include the following: she has Allergic rhinitis; Arthritis of right knee; Chronic back pain; Closed fracture of posterior wall of left acetabulum (HCC); Closed nondisplaced fracture of anterior wall of left acetabulum (HCC); DDD (degenerative disc disease), lumbar; Depression; Essential hypertension; Knee pain, bilateral;  Localized osteoarthritis of right knee; Microcytic hypochromic anemia; OSA (obstructive sleep apnea); Pain in both feet; Pain medication agreement signed; Elevated LFTs; Gallstones; Jaundice; Choledocholithiasis; and Obesity, Class III, BMI 40-49.9 (morbid obesity) (HCC) on their problem list. she  has a past medical history of Anemia, Anxiety, Arthritis, Chronic back pain, Diabetes mellitus (HCC), H/O degenerative disc disease, Hypertension, Peroneal tendon rupture, right, initial encounter (2025), Sciatica, and Sleep apnea. she  has a past surgical history that includes Total knee arthroplasty (Right, 04/09/2021); breast bioosy; lumbar osteopenia (2023); Hysteroscopy; Intraoperative cholangiogram (N/A, 06/16/2021); Cholecystectomy; and Repair peroneal longus tendon with tubularization and anastomosis to peroneus brevis tendon 2. Tenolysis peroneus brevis tendon right lateral ankle (Right, 07/22/2023). Patient denies hx of cancer, stroke, seizures, lung problems, heart problems, unexplained weight loss, and spinal surgery. She states she urinates a lot. This has been going on for 4-5 months. She has not spoken to her doctors about it. She states she cannot hold her urine long if she has an urge to go. She states her hands cramp up and she drops things sometimes. She states her blood is not circulating like it should in the left leg. She states her left hand goes to sleep all the time too.   Exercise history: has 5#DB set at home.   SUBJECTIVE STATEMENT: Patient states she is feeling about 8-9/10 pain upon arrival. She didn't bring her assistive device. She states her pain is in her left lower back. She states it took 2 days to feel a little better after last PT session because she was so sore and she could hardly walk. She was sore in the usual places she has pain. She states she takes blood pressure medication in the mornings.   PAIN: NPRS: 8-9/10 in left lower back.   From initial PT evaluation on  02/23/24:  NPRS: Current: 7/10 not that bad,  Best: 7/10, Worst: 10/10, Worst in the last 2 weeks: 10/10 (went to hospital).  Pain location: left glute Pain description: constant with burning (before injection) at left glute, intermittent radiation down left leg to bottom of foot. Also goes up towards the back. Numbness/tingling: left leg and foot. Lateral ankle and foot on the right.  Aggravating factors: jumping around in the pool, standing up too long, sitting down too long Relieving factors: injection 3 days ago (less burning), heat helped at first, ice helped at first 24 hour clock: no time of day is better  PRECAUTIONS: Fall, not supposed to lift over 5-10 lbs.   WEIGHT BEARING RESTRICTIONS: No  FALLS:  Has patient fallen in last 6 months? Yes. Number of falls 1, she was coming in the house and she is not sure if she fell over the threshold. She fell on the left side.  She has a cane and walker in the car and she uses those when her pain is at it's worst.   PLOF: going to the gym, walking (I stayed at the track), liked water aerobics, and loved yardwork. Was not limited in her ability to do these things.   PATIENT GOALS: to get better and to be able to do most of the things she used to do  OBJECTIVE  Vitals:   02/27/24 1520  BP: (!) 132/56  Pulse: 83  SpO2: 98%    SELF-REPORTED FUNCTION Patient Specific Functional Scale (PSFS)  Walking: 3/10 Sleeping: 2/10 Going to water aerobics: 2/10 Average: 2.3/10  SUPINE Neurological Testing Reflex Testing Patellar Reflex (L3-L4):  R: 0+ L: 0+  Hamstring Reflex (L5) R: 0+ L: 0+  Achilles Reflex (S1) R: 0+ L: 0+  Myotome Testing Ankle Eversion (S1) R: 5/5 L: weaker, painful after first rep Anterior Tibialis (L4-L5) R: 5/5 L: weaker, painful Extensor Hallucis Longus (L5) R: 5/5 L: 5/5                                                                                                                  TREATMENT  Therapeutic exercise: therapeutic exercises that incorporate ONE parameter at one or more areas of the body to centralize symptoms, develop strength and endurance, range of motion, and flexibility required for successful completion of functional activities. Vitals measurement for system's review (see above) LE neuro testing to assess neuro vital signs to get baseline and track improvement (see above).   Therapeutic activities: dynamic therapeutic activities incorporating MULTIPLE parameters or areas of the body designed to achieve improved functional performance. Supine <> sit x2 with log roll technique Fair carry over  Standing unloaded squats with UE support on sink improve transfer tolerance while providing intermittant loading to the lumbar psine.  1x10  Standing lat pull down with elbows extended to 90 degrees shoulder flexion to improve standing tolerance while providing intermittant loading to the lumbar spine.  1x10 with 15# cable Stooped posture  Neuromuscular Re-education: a technique or exercise performed with the goal of improving the level of communication between the body and the brain, such as for balance, motor control, muscle activation patterns, coordination, desensitization, quality of muscle contraction, proprioception, and/or kinesthetic sense needed for successful and safe completion of functional activities.  Hooklying pelvic tilt AROM in pain limited/free range 1x10 after finding pain limited range and learning how to perform Tactile cuing under lumbar spine and at anterior pelvis  Hooklying PPP with bilateral bent knee fall out to improve lumbopelvic coordination and decrease psoas tension position and decrease hamstrings use to hold PPT (since pt continues to report L hamstring cramping with PPT in hooklying).  1x10 after learning how to perform Added to HEP  Hooklying butterfly PPT with lat pull over, focusing on feeling with low back arches and  correcting it 1x15 with 3#DB in each hand   Pt required multimodal cuing for proper technique and to facilitate improved neuromuscular control, strength, range of motion, and functional ability resulting in improved performance and form.  PATIENT EDUCATION:  Education details: Exercise purpose/form. Self management techniques. Education on diagnosis, prognosis, POC, anatomy and physiology of current condition. Education on HEP including handout. Person educated: Patient Education method: Explanation, Demonstration, and Handouts Education comprehension: verbalized understanding, returned demonstration, and needs further education  HOME EXERCISE PROGRAM: Access Code: [email or web address] URL: [email or web address] Date: 02/27/2024 Prepared by: Camie Cleverly  Exercises - Sitting to Supine Roll  - Supine Pelvic Tilt  - 2 x daily - 1 sets - 20 reps - Supine Transversus Abdominis Bracing with Double Leg Fallout  - 2 x daily - 2 sets - 20 reps - Hooklying Shoulder I  - 2 x daily - 2 sets - 20 reps   ASSESSMENT:  CLINICAL IMPRESSION: Patient arrives with no AD and continued high level of pain, worse with standing and moving. Better measured baseline neuro vital signs (myotomes and DTR) to help track progression and irritability over time. Patient had absent DTR bilaterally for quads, achilles, and hamstrings. She had decreased myotome on left for ankle eversion (S1) and dorsiflexion (L4-L5), but had equal strength in great toe extension (L5). Started working on motor control needed for anti-extension exercises at the lumbar spine and started exercises to be used in unloading circuit. Patient was most painful in standing positions but did have slight reduction in pain during unloading portion of these exercises. Patient had increased left LE cramping during hooklying exercises which was likely related to use of hamstring during PPT. This was improved with hip abducted position. She also had pain  across the lower abdomen with initiation of posterior pelvic tilt, but this improved with continued work on this movement. Updated HEP with exercises pt tolerated the best and could perform at home. She reported feeling slightly better in sitting by end of session. Patient would benefit from continued management of limiting condition by skilled physical therapist to address remaining impairments and functional limitations to work towards stated goals and return to PLOF or maximal functional independence.   Mechanical sensitivities: lumbar load, neural tension, left lumbar sidebending Multifidi:  Psoas:  From initial PT evaluation on 02/23/24:  Patient is a 58 y.o. female referred to outpatient physical therapy with a medical diagnosis of  greater trochanteric pain syndrome of the left lower extremity who presents with signs and symptoms consistent with left hip pain chronic low back pain with bilateral radicular symptoms, but concordant at the left hip. Patient had relief of concordant L hip pain with lumbar unloading, pain was reproduced with SLR test, sensitive to foot position, concordant pain was strongly reproduced with left sidebending at the low back. Patient has accompanying numbness and tingling down the L leg, which is not a common finding in extra-articular hip conditions. She reported the improvement from the RFA did not last beyond about 1 year ago. Hip exam did not reproduce intra-articular pain but was limited by guarding. She did report some improvement with ultrasound-guided steroid injection to the left glutal tendons earlier this week but still experienced breathtaking concordant pain today during examination in the clinic. She has radiographic evidence of L and likely R glute med tears and tendinopathy, and this will be addressed in PT along with GTPS and lumbar sources of left hip pain. She appears to be sensitive to the following mechanical stressors at the lumbar spine: compressive  load, left sidebending, neural tension. She appears at risk for falls and would likely benefit from using a RW at all times to help unload the spine as she ambulates with stooped posture that increases load on the low back. Patient presents with significant pain, ROM, muscle guarding, paresthesia, nerve tension, joint stiffness, posture, gait, muscle performance (strength/power/endurance), knowledge, balance, and activity tolerance impairments that are limiting ability to complete usual activities including basic mobility, exercising, walking, standing, homemaking, yardwork, lifting, social life, sleeping, standing, traveling without difficulty. Patient will benefit from skilled physical  therapy intervention to address current body structure impairments and activity limitations to improve function and work towards goals set in current POC in order to return to prior level of function or maximal functional improvement.   OBJECTIVE IMPAIRMENTS: Abnormal gait, decreased activity tolerance, decreased balance, decreased coordination, decreased endurance, decreased knowledge of condition, decreased knowledge of use of DME, decreased mobility, difficulty walking, decreased ROM, decreased strength, hypomobility, increased muscle spasms, impaired flexibility, improper body mechanics, postural dysfunction, obesity, and pain.   ACTIVITY LIMITATIONS: carrying, lifting, bending, sitting, standing, squatting, sleeping, stairs, transfers, bed mobility, dressing, hygiene/grooming, and locomotion level  PARTICIPATION LIMITATIONS: meal prep, cleaning, laundry, community activity, and yard work  PERSONAL FACTORS: Age, limited success in PT for similar pain in the past, Fitness, Past/current experiences, Time since onset of injury/illness/exacerbation, and 3+ comorbidities: allergic rhinitis; Arthritis of right knee; Chronic back pain; Closed fracture of posterior wall of left acetabulum (HCC); Closed nondisplaced fracture of  anterior wall of left acetabulum (HCC); DDD (degenerative disc disease), lumbar; Depression; Essential hypertension; Knee pain, bilateral; Localized osteoarthritis of right knee; Microcytic hypochromic anemia; OSA (obstructive sleep apnea); Pain in both feet; Pain medication agreement signed; Elevated LFTs; Gallstones; Jaundice; Choledocholithiasis; and Obesity, Class III, BMI 40-49.9 (morbid obesity) (HCC) on their problem list. she  has a past medical history of Anemia, Anxiety, Arthritis, Chronic back pain, Diabetes mellitus (HCC), H/O degenerative disc disease, Hypertension, Peroneal tendon rupture, right, initial encounter (2025), Sciatica, and Sleep apnea. she  has a past surgical history that includes Total knee arthroplasty (Right, 04/09/2021); breast bioosy; lumbar osteopenia (2023); Hysteroscopy; Intraoperative cholangiogram (N/A, 06/16/2021); Cholecystectomy; and Repair peroneal longus tendon with tubularization and anastomosis to peroneus brevis tendon 2. Tenolysis peroneus brevis tendon right lateral ankle (Right, 07/22/2023) are also affecting patient's functional outcome.   REHAB POTENTIAL: Fair due to limited improvements from PT for similar conditions in the past  CLINICAL DECISION MAKING: Evolving/moderate complexity  EVALUATION COMPLEXITY: Moderate   GOALS: Goals reviewed with patient? No  SHORT TERM GOALS: Target date: 03/08/2024  Patient will be independent with initial home exercise program for self-management of symptoms. Baseline: Initial HEP provided at IE (02/23/24); Goal status: In progress  LONG TERM GOALS: Target date: 05/17/2024  Patient will be independent with a long-term home exercise program for self-management of symptoms.  Baseline: Initial HEP provided at IE (02/23/24); Goal status: In progress  2.  Patient will demonstrate improved Modified Oswestry Disability Index (mODI) to equal or less than 30% to demonstrate improvement in overall condition and  self-reported functional ability.  Baseline: 44% (02/23/24); Goal status: In progress  3.  Patient will complete 5 Time Sit To Stand in equal or less than 15 seconds with no UE support from 18.5 inch or less surface to demonstrate improved LE strength, power, and balance for functional mobility.  Baseline: unable to stand up without B UE support (02/23/24); Goal status: In progress  4.  Patient will ambulate equal or greater than 1300 feet during 6 Minute Walk Test with LRAD to improve her community mobility.  Baseline: difficulty walking to/from clinic with no AD, to be formally tested at later date (02/23/24); Goal status: In progress  5.  Patient will demonstrate improvement in Patient Specific Functional Scale (PSFS) of equal or greater than 8/10 points to reflect clinically significant improvement in patient's most valued functional activities. Baseline: to be measured at visit 2 as appropriate (02/23/24); 2.3/10 (02/27/2024);  Goal status: In progress  6.  Patient will report NPRS equal or less than  3/10 during functional activities during the last 2 weeks to improve their abilitly to complete community, work and/or recreational activities with less limitation. Baseline: 10/10 (02/23/24); Goal status: In progress   PLAN:  PT FREQUENCY: 2x/week  PT DURATION: 12 weeks  PLANNED INTERVENTIONS: 97164- PT Re-evaluation, 97750- Physical Performance Testing, 97110-Therapeutic exercises, 97530- Therapeutic activity, [id number]- Neuromuscular re-education, 97535- Self Care, 02859- Manual therapy, [phone]- Gait training, [phone]- Electrical stimulation (unattended), [phone]- Ionotophoresis 4mg /ml Dexamethasone , 79439 (1-2 muscles), 20561 (3+ muscles)- Dry Needling, Patient/Family education, Cryotherapy, and Moist heat  PLAN FOR NEXT SESSION: lumbar unloading circuit, isometric hip strength, update HEP as appropriate, education on mechanical stressors and modifications of activities to avoid them,  recover motor control and awareness of modifiers to mechanical sensitivities, retrain motor patterns, regain ROM, improve strength and resilience needed for  performing desired functional performance with appropriate ROM, strength, power, and endurance. Manual therapy as needed.   Camie SAUNDERS. Juli, PT, DPT, Cert. MDT 02/27/24, 4:29 PM  [facility] [address] [place], [place] [address] P: [phone] I F: [phone]

## 2024-02-29 ENCOUNTER — Ambulatory Visit: Admitting: Physical Therapy

## 2024-03-05 ENCOUNTER — Encounter: Payer: Self-pay | Admitting: Physical Therapy

## 2024-03-05 ENCOUNTER — Ambulatory Visit: Admitting: Physical Therapy

## 2024-03-05 DIAGNOSIS — Z9181 History of falling: Secondary | ICD-10-CM | POA: Insufficient documentation

## 2024-03-05 DIAGNOSIS — R2689 Other abnormalities of gait and mobility: Secondary | ICD-10-CM | POA: Insufficient documentation

## 2024-03-05 DIAGNOSIS — M5459 Other low back pain: Secondary | ICD-10-CM | POA: Diagnosis present

## 2024-03-05 DIAGNOSIS — M792 Neuralgia and neuritis, unspecified: Secondary | ICD-10-CM | POA: Diagnosis present

## 2024-03-05 DIAGNOSIS — M25552 Pain in left hip: Secondary | ICD-10-CM | POA: Insufficient documentation

## 2024-03-05 NOTE — Therapy (Signed)
 OUTPATIENT PHYSICAL THERAPY TREATMENT   Patient Name: Ariel Davis MRN: [id number] DOB:October 27, 1965, 58 y.o., female Today's Date: 03/05/2024  END OF SESSION:  PT End of Session - 03/05/24 1525     Visit Number 3    Number of Visits 17    Date for Recertification  05/17/24    Authorization Type [employer]ICARE reporting period from 02/23/2024    Progress Note Due on Visit 10    PT Start Time 1527    PT Stop Time 1606    PT Time Calculation (min) 39 min    Activity Tolerance Patient limited by pain    Behavior During Therapy WFL for tasks assessed/performed         Past Medical History:  Diagnosis Date   Anemia    Anxiety    Arthritis    Chronic back pain    Diabetes mellitus without complication (HCC)    H/O degenerative disc disease    Hypertension    Peroneal tendon rupture, right, initial encounter 2025   Sciatica    Sleep apnea    cpap   Past Surgical History:  Procedure Laterality Date   breast bioosy     CHOLECYSTECTOMY     ENDOSCOPIC RETROGRADE CHOLANGIOPANCREATOGRAPHY (ERCP) WITH PROPOFOL  N/A 06/17/2021   Procedure: ENDOSCOPIC RETROGRADE CHOLANGIOPANCREATOGRAPHY (ERCP) WITH PROPOFOL ;  Surgeon: Jinny Carmine, MD;  Location: [facility];  Service: Endoscopy;  Laterality: N/A;   HYSTEROSCOPY     INTRAOPERATIVE CHOLANGIOGRAM N/A 06/16/2021   Procedure: INTRAOPERATIVE CHOLANGIOGRAM;  Surgeon: Rodolph Romano, MD;  Location: [facility];  Service: General;  Laterality: N/A;   LIGAMENT REPAIR Right 07/22/2023   Procedure: REPAIR, LIGAMENT;  Surgeon: Ashley Soulier, DPM;  Location: [facility];  Service: Orthopedics/Podiatry;  Laterality: Right;   TOTAL KNEE ARTHROPLASTY Right 04/09/2021   Patient Active Problem List   Diagnosis Date Noted   Obesity, Class III, BMI 40-49.9 (morbid obesity) (HCC) 06/18/2021   Acute cholecystitis 06/17/2021   Choledocholithiasis    Elevated LFTs 06/15/2021   Gallstones    Jaundice    Arthritis of right knee  01/31/2018   DDD (degenerative disc disease), lumbar 01/31/2018   Pain medication agreement signed 01/31/2018   Chronic back pain 01/02/2018   Pain in both feet 08/31/2017   Knee pain, bilateral 07/05/2017   Depression 06/07/2017   OSA (obstructive sleep apnea) 05/12/2017   Closed nondisplaced fracture of anterior wall of left acetabulum (HCC) 08/27/2016   Localized osteoarthritis of right knee 08/27/2016   Closed fracture of posterior wall of left acetabulum (HCC) 08/23/2016   Essential hypertension 06/21/2016   Allergic rhinitis 12/06/2013   Microcytic hypochromic anemia 12/06/2013   PCP: Gayle Verneita PARAS, NP  REFERRING PROVIDER: Curtistine DOROTHA Crandall, DO  REFERRING DIAG:  greater trochanteric pain syndrome of the left lower extremity  THERAPY DIAG:  Pain in left hip  History of falling  Other low back pain  Neuralgia and neuritis  Other abnormalities of gait and mobility  Rationale for Evaluation and Treatment: Rehabilitation  ONSET DATE: 2018  SUBJECTIVE:   PERTINENT HISTORY: Patient is a 58 y.o. 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 This patient's chief complaints consist of left lateral hip pain radiating down left leg with numbness and tingling under foot, and also radiating at low back, leading to the following functional deficits: difficulty with usual activities including basic mobility, exercising, walking, standing, homemaking, yardwork, lifting, social life, sleeping,  standing, traveling. Relevant past medical history and comorbidities include the following: she has Allergic rhinitis; Arthritis of right knee; Chronic back pain; Closed fracture of posterior wall of left acetabulum (HCC); Closed nondisplaced fracture of anterior wall of left acetabulum (HCC); DDD (degenerative disc disease), lumbar; Depression; Essential hypertension; Knee pain, bilateral;  Localized osteoarthritis of right knee; Microcytic hypochromic anemia; OSA (obstructive sleep apnea); Pain in both feet; Pain medication agreement signed; Elevated LFTs; Gallstones; Jaundice; Choledocholithiasis; and Obesity, Class III, BMI 40-49.9 (morbid obesity) (HCC) on their problem list. she  has a past medical history of Anemia, Anxiety, Arthritis, Chronic back pain, Diabetes mellitus (HCC), H/O degenerative disc disease, Hypertension, Peroneal tendon rupture, right, initial encounter (2025), Sciatica, and Sleep apnea. she  has a past surgical history that includes Total knee arthroplasty (Right, 04/09/2021); breast bioosy; lumbar osteopenia (2023); Hysteroscopy; Intraoperative cholangiogram (N/A, 06/16/2021); Cholecystectomy; and Repair peroneal longus tendon with tubularization and anastomosis to peroneus brevis tendon 2. Tenolysis peroneus brevis tendon right lateral ankle (Right, 07/22/2023). Patient denies hx of cancer, stroke, seizures, lung problems, heart problems, unexplained weight loss, and spinal surgery. She states she urinates a lot. This has been going on for 4-5 months. She has not spoken to her doctors about it. She states she cannot hold her urine long if she has an urge to go. She states her hands cramp up and she drops things sometimes. She states her blood is not circulating like it should in the left leg. She states her left hand goes to sleep all the time too.   Exercise history: has 5#DB set at home.   SUBJECTIVE STATEMENT: Patient stated she is doing a lot better than when she first came in here. Started using patches given by her doctor and has been keeping the pain away but notices radicular symptoms when moving a certain way. PAIN: NPRS: 7/10 in the low back  From initial PT evaluation on 02/23/24:  NPRS: Current: 7/10 not that bad,  Best: 7/10, Worst: 10/10, Worst in the last 2 weeks: 10/10 (went to hospital).  Pain location: left glute Pain description: constant with  burning (before injection) at left glute, intermittent radiation down left leg to bottom of foot. Also goes up towards the back. Numbness/tingling: left leg and foot. Lateral ankle and foot on the right.  Aggravating factors: jumping around in the pool, standing up too long, sitting down too long Relieving factors: injection 3 days ago (less burning), heat helped at first, ice helped at first 24 hour clock: no time of day is better  PRECAUTIONS: Fall, not supposed to lift over 5-10 lbs.   WEIGHT BEARING RESTRICTIONS: No  FALLS:  Has patient fallen in last 6 months? Yes. Number of falls 1, she was coming in the house and she is not sure if she fell over the threshold. She fell on the left side.  She has a cane and walker in the car and she uses those when her pain is at it's worst.   PLOF: going to the gym, walking (I stayed at the track), liked water aerobics, and loved yardwork. Was not limited in her ability to do these things.   PATIENT GOALS: to get better and to be able to do most of the things she used to do  OBJECTIVE  TREATMENT  Neuromuscular Re-education: a technique or exercise performed with the goal of improving the level of communication between the body and the brain, such as for balance, motor control, muscle activation patterns, coordination, desensitization, quality of muscle contraction, proprioception, and/or kinesthetic sense needed for successful and safe completion of functional activities.   Hooklying PPT with bilateral bent knee fall out to improve lumbopelvic coordination and decrease psoas tension position and decrease hamstrings use to hold PPT (since pt continues to report L hamstring cramping with PPT in hooklying).  2 x 10  Standing pallof press with PPT using 5# cable to strengthen core while maintaining lumbo pelvic control   2 x 10 each side  Hooklying PPT within pain free range ROM to improve lumbo  pelvic control   2 x 10  Therapeutic exercise: therapeutic exercises that incorporate ONE parameter at one or more areas of the body to centralize symptoms, develop strength and endurance, range of motion, and flexibility required for successful completion of functional activities.  Supine <> sit via log roll technique x 2 Fair carry over  Hooklying butterfly PPT with lat pull over, focusing on feeling with low back arches and correcting it 2 x 15   Seated Isometric hip adductions to strengthen hips   2 x 10  Seated Isometric hip Abductions to strengthen hips   2 x 10   Pt cued to leave hip again wall rather than bringing it back in   Standing B hip flexor stretch with UE support on wall   2 x 15 seconds   Pt noted increase stiffness on L side compared to the R  Pt required multimodal cuing for proper technique and to facilitate improved neuromuscular control, strength, range of motion, and functional ability resulting in improved performance and form.   PATIENT EDUCATION:  Education details: Exercise purpose/form. Self management techniques. Education on diagnosis, prognosis, POC, anatomy and physiology of current condition. Education on HEP including handout. Person educated: Patient Education method: Explanation, Demonstration, and Handouts Education comprehension: verbalized understanding, returned demonstration, and needs further education  HOME EXERCISE PROGRAM: Access Code: [email or web address] URL: [email or web address] Date: 02/27/2024 Prepared by: Camie Cleverly  Exercises - Sitting to Supine Roll  - Supine Pelvic Tilt  - 2 x daily - 1 sets - 20 reps - Supine Transversus Abdominis Bracing with Double Leg Fallout  - 2 x daily - 2 sets - 20 reps - Hooklying Shoulder I  - 2 x daily - 2 sets - 20 reps  ASSESSMENT:  CLINICAL IMPRESSION: Completed isometric hip abductions and adduction to begin strengthen the hips. Patient noted increased difficulty with hip  abductions compared to the adductions. Patient noted some cramping in lateral hips after completion of second set of hip abductions.Completed standing hip flexor stretch due to patient noted some pain when going to raise leg against the wall, noted her L side was tighter than the R. Completed standing pallof press with PPT to strengthen core while maintain lumbo pelvic control. Patient required sitting breaks in between sets due to fatigue in the legs from prior LE strengthen exercises. Patient reported no change in pain upon completion of session. Patient would benefit from continued management of limiting condition by skilled physical therapist to address remaining impairments and functional limitations to work towards stated goals and return to PLOF or maximal functional independence.   Mechanical sensitivities: lumbar load, neural tension, left lumbar sidebending Multifidi:  Psoas: L tighter than R (per pt report in standing stretch)  From initial PT  evaluation on 02/23/24:  Patient is a 58 y.o. female referred to outpatient physical therapy with a medical diagnosis of  greater trochanteric pain syndrome of the left lower extremity who presents with signs and symptoms consistent with left hip pain chronic low back pain with bilateral radicular symptoms, but concordant at the left hip. Patient had relief of concordant L hip pain with lumbar unloading, pain was reproduced with SLR test, sensitive to foot position, concordant pain was strongly reproduced with left sidebending at the low back. Patient has accompanying numbness and tingling down the L leg, which is not a common finding in extra-articular hip conditions. She reported the improvement from the RFA did not last beyond about 1 year ago. Hip exam did not reproduce intra-articular pain but was limited by guarding. She did report some improvement with ultrasound-guided steroid injection to the left glutal tendons earlier this week but still  experienced breathtaking concordant pain today during examination in the clinic. She has radiographic evidence of L and likely R glute med tears and tendinopathy, and this will be addressed in PT along with GTPS and lumbar sources of left hip pain. She appears to be sensitive to the following mechanical stressors at the lumbar spine: compressive load, left sidebending, neural tension. She appears at risk for falls and would likely benefit from using a RW at all times to help unload the spine as she ambulates with stooped posture that increases load on the low back. Patient presents with significant pain, ROM, muscle guarding, paresthesia, nerve tension, joint stiffness, posture, gait, muscle performance (strength/power/endurance), knowledge, balance, and activity tolerance impairments that are limiting ability to complete usual activities including basic mobility, exercising, walking, standing, homemaking, yardwork, lifting, social life, sleeping, standing, traveling without difficulty. Patient will benefit from skilled physical therapy intervention to address current body structure impairments and activity limitations to improve function and work towards goals set in current POC in order to return to prior level of function or maximal functional improvement.   OBJECTIVE IMPAIRMENTS: Abnormal gait, decreased activity tolerance, decreased balance, decreased coordination, decreased endurance, decreased knowledge of condition, decreased knowledge of use of DME, decreased mobility, difficulty walking, decreased ROM, decreased strength, hypomobility, increased muscle spasms, impaired flexibility, improper body mechanics, postural dysfunction, obesity, and pain.   ACTIVITY LIMITATIONS: carrying, lifting, bending, sitting, standing, squatting, sleeping, stairs, transfers, bed mobility, dressing, hygiene/grooming, and locomotion level  PARTICIPATION LIMITATIONS: meal prep, cleaning, laundry, community activity, and  yard work  PERSONAL FACTORS: Age, limited success in PT for similar pain in the past, Fitness, Past/current experiences, Time since onset of injury/illness/exacerbation, and 3+ comorbidities: allergic rhinitis; Arthritis of right knee; Chronic back pain; Closed fracture of posterior wall of left acetabulum (HCC); Closed nondisplaced fracture of anterior wall of left acetabulum (HCC); DDD (degenerative disc disease), lumbar; Depression; Essential hypertension; Knee pain, bilateral; Localized osteoarthritis of right knee; Microcytic hypochromic anemia; OSA (obstructive sleep apnea); Pain in both feet; Pain medication agreement signed; Elevated LFTs; Gallstones; Jaundice; Choledocholithiasis; and Obesity, Class III, BMI 40-49.9 (morbid obesity) (HCC) on their problem list. she  has a past medical history of Anemia, Anxiety, Arthritis, Chronic back pain, Diabetes mellitus (HCC), H/O degenerative disc disease, Hypertension, Peroneal tendon rupture, right, initial encounter (2025), Sciatica, and Sleep apnea. she  has a past surgical history that includes Total knee arthroplasty (Right, 04/09/2021); breast bioosy; lumbar osteopenia (2023); Hysteroscopy; Intraoperative cholangiogram (N/A, 06/16/2021); Cholecystectomy; and Repair peroneal longus tendon with tubularization and anastomosis to peroneus brevis tendon 2. Tenolysis peroneus brevis tendon right lateral  ankle (Right, 07/22/2023) are also affecting patient's functional outcome.   REHAB POTENTIAL: Fair due to limited improvements from PT for similar conditions in the past  CLINICAL DECISION MAKING: Evolving/moderate complexity  EVALUATION COMPLEXITY: Moderate   GOALS: Goals reviewed with patient? No  SHORT TERM GOALS: Target date: 03/08/2024  Patient will be independent with initial home exercise program for self-management of symptoms. Baseline: Initial HEP provided at IE (02/23/24); Goal status: In progress  LONG TERM GOALS: Target date:  05/17/2024  Patient will be independent with a long-term home exercise program for self-management of symptoms.  Baseline: Initial HEP provided at IE (02/23/24); Goal status: In progress  2.  Patient will demonstrate improved Modified Oswestry Disability Index (mODI) to equal or less than 30% to demonstrate improvement in overall condition and self-reported functional ability.  Baseline: 44% (02/23/24); Goal status: In progress  3.  Patient will complete 5 Time Sit To Stand in equal or less than 15 seconds with no UE support from 18.5 inch or less surface to demonstrate improved LE strength, power, and balance for functional mobility.  Baseline: unable to stand up without B UE support (02/23/24); Goal status: In progress  4.  Patient will ambulate equal or greater than 1300 feet during 6 Minute Walk Test with LRAD to improve her community mobility.  Baseline: difficulty walking to/from clinic with no AD, to be formally tested at later date (02/23/24); Goal status: In progress  5.  Patient will demonstrate improvement in Patient Specific Functional Scale (PSFS) of equal or greater than 8/10 points to reflect clinically significant improvement in patient's most valued functional activities. Baseline: to be measured at visit 2 as appropriate (02/23/24); 2.3/10 (02/27/2024);  Goal status: In progress  6.  Patient will report NPRS equal or less than 3/10 during functional activities during the last 2 weeks to improve their abilitly to complete community, work and/or recreational activities with less limitation. Baseline: 10/10 (02/23/24); Goal status: In progress   PLAN:  PT FREQUENCY: 2x/week  PT DURATION: 12 weeks  PLANNED INTERVENTIONS: 97164- PT Re-evaluation, 97750- Physical Performance Testing, 97110-Therapeutic exercises, 97530- Therapeutic activity, [id number]- Neuromuscular re-education, 97535- Self Care, [id number]- Manual therapy, [id number]- Gait training, [phone]- Electrical stimulation  (unattended), [phone]- Ionotophoresis 4mg /ml Dexamethasone , 79439 (1-2 muscles), 20561 (3+ muscles)- Dry Needling, Patient/Family education, Cryotherapy, and Moist heat  PLAN FOR NEXT SESSION:  Consider adding functional strengthening exercises such as STS. Update HEP as appropriate, education on mechanical stressors and modifications of activities to avoid them, recover motor control and awareness of modifiers to mechanical sensitivities, retrain motor patterns, regain ROM, improve strength and resilience needed for  performing desired functional performance with appropriate ROM, strength, power, and endurance. Manual therapy as needed.  Vernell Rise, SPT 03/05/24  Camie SAUNDERS. Juli, PT, DPT, Cert. MDT 03/05/24, 4:36 PM  [facility] [address] [place], [place] [address] P: [phone] I F: [phone]

## 2024-03-05 NOTE — Addendum Note (Signed)
 Addended by: JULI CREDIT R on: 03/05/2024 02:15 PM   Modules accepted: Orders

## 2024-03-06 NOTE — Therapy (Deleted)
 OUTPATIENT PHYSICAL THERAPY TREATMENT   Patient Name: Ariel Davis MRN: [id number] DOB:09-Apr-1965, 58 y.o., female Today's Date: 03/06/2024  END OF SESSION:   Past Medical History:  Diagnosis Date   Anemia    Anxiety    Arthritis    Chronic back pain    Diabetes mellitus without complication (HCC)    H/O degenerative disc disease    Hypertension    Peroneal tendon rupture, right, initial encounter 2025   Sciatica    Sleep apnea    cpap   Past Surgical History:  Procedure Laterality Date   breast bioosy     CHOLECYSTECTOMY     ENDOSCOPIC RETROGRADE CHOLANGIOPANCREATOGRAPHY (ERCP) WITH PROPOFOL  N/A 06/17/2021   Procedure: ENDOSCOPIC RETROGRADE CHOLANGIOPANCREATOGRAPHY (ERCP) WITH PROPOFOL ;  Surgeon: Jinny Carmine, MD;  Location: [facility];  Service: Endoscopy;  Laterality: N/A;   HYSTEROSCOPY     INTRAOPERATIVE CHOLANGIOGRAM N/A 06/16/2021   Procedure: INTRAOPERATIVE CHOLANGIOGRAM;  Surgeon: Rodolph Romano, MD;  Location: [facility];  Service: General;  Laterality: N/A;   LIGAMENT REPAIR Right 07/22/2023   Procedure: REPAIR, LIGAMENT;  Surgeon: Ashley Soulier, DPM;  Location: [facility];  Service: Orthopedics/Podiatry;  Laterality: Right;   TOTAL KNEE ARTHROPLASTY Right 04/09/2021   Patient Active Problem List   Diagnosis Date Noted   Obesity, Class III, BMI 40-49.9 (morbid obesity) (HCC) 06/18/2021   Acute cholecystitis 06/17/2021   Choledocholithiasis    Elevated LFTs 06/15/2021   Gallstones    Jaundice    Arthritis of right knee 01/31/2018   DDD (degenerative disc disease), lumbar 01/31/2018   Pain medication agreement signed 01/31/2018   Chronic back pain 01/02/2018   Pain in both feet 08/31/2017   Knee pain, bilateral 07/05/2017   Depression 06/07/2017   OSA (obstructive sleep apnea) 05/12/2017   Closed nondisplaced fracture of anterior wall of left acetabulum (HCC) 08/27/2016   Localized osteoarthritis of right knee 08/27/2016   Closed fracture of  posterior wall of left acetabulum (HCC) 08/23/2016   Essential hypertension 06/21/2016   Allergic rhinitis 12/06/2013   Microcytic hypochromic anemia 12/06/2013   PCP: Gayle Verneita PARAS, NP  REFERRING PROVIDER: Curtistine DOROTHA Crandall, DO  REFERRING DIAG:  greater trochanteric pain syndrome of the left lower extremity  THERAPY DIAG:  History of falling  Pain in left hip  Other abnormalities of gait and mobility  Other low back pain  Neuralgia and neuritis  Rationale for Evaluation and Treatment: Rehabilitation  ONSET DATE: 2018  SUBJECTIVE:   PERTINENT HISTORY: Patient is a 58 y.o. 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 This patient's chief complaints consist of left lateral hip pain radiating down left leg with numbness and tingling under foot, and also radiating at low back, leading to the following functional deficits: difficulty with usual activities including basic mobility, exercising, walking, standing, homemaking, yardwork, lifting, social life, sleeping, standing, traveling. Relevant past medical history and comorbidities include the following: she has Allergic rhinitis; Arthritis of right knee; Chronic back pain; Closed fracture of posterior wall of left acetabulum (HCC); Closed nondisplaced fracture of anterior wall of left acetabulum (HCC); DDD (degenerative disc disease), lumbar; Depression; Essential hypertension; Knee pain, bilateral; Localized osteoarthritis of right knee; Microcytic hypochromic anemia; OSA (obstructive sleep apnea); Pain in both feet; Pain medication agreement signed; Elevated LFTs; Gallstones; Jaundice; Choledocholithiasis; and Obesity, Class III, BMI 40-49.9 (morbid obesity) (HCC) on their problem list. she  has a past medical history of Anemia,  Anxiety, Arthritis, Chronic back pain, Diabetes mellitus (HCC), H/O degenerative disc disease,  Hypertension, Peroneal tendon rupture, right, initial encounter (2025), Sciatica, and Sleep apnea. she  has a past surgical history that includes Total knee arthroplasty (Right, 04/09/2021); breast bioosy; lumbar osteopenia (2023); Hysteroscopy; Intraoperative cholangiogram (N/A, 06/16/2021); Cholecystectomy; and Repair peroneal longus tendon with tubularization and anastomosis to peroneus brevis tendon 2. Tenolysis peroneus brevis tendon right lateral ankle (Right, 07/22/2023). Patient denies hx of cancer, stroke, seizures, lung problems, heart problems, unexplained weight loss, and spinal surgery. She states she urinates a lot. This has been going on for 4-5 months. She has not spoken to her doctors about it. She states she cannot hold her urine long if she has an urge to go. She states her hands cramp up and she drops things sometimes. She states her blood is not circulating like it should in the left leg. She states her left hand goes to sleep all the time too.   Exercise history: has 5#DB set at home.   SUBJECTIVE STATEMENT: Patient stated she is doing a lot better than when she first came in here. Started using patches given by her doctor and has been keeping the pain away but notices radicular symptoms when moving a certain way. PAIN: NPRS: 7/10 in the low back  From initial PT evaluation on 02/23/24:  NPRS: Current: 7/10 not that bad,  Best: 7/10, Worst: 10/10, Worst in the last 2 weeks: 10/10 (went to hospital).  Pain location: left glute Pain description: constant with burning (before injection) at left glute, intermittent radiation down left leg to bottom of foot. Also goes up towards the back. Numbness/tingling: left leg and foot. Lateral ankle and foot on the right.  Aggravating factors: jumping around in the pool, standing up too long, sitting down too long Relieving factors: injection 3 days ago (less burning), heat helped at first, ice helped at first 24 hour clock: no time of day is  better  PRECAUTIONS: Fall, not supposed to lift over 5-10 lbs.   WEIGHT BEARING RESTRICTIONS: No  FALLS:  Has patient fallen in last 6 months? Yes. Number of falls 1, she was coming in the house and she is not sure if she fell over the threshold. She fell on the left side.  She has a cane and walker in the car and she uses those when her pain is at it's worst.   PLOF: going to the gym, walking (I stayed at the track), liked water aerobics, and loved yardwork. Was not limited in her ability to do these things.   PATIENT GOALS: to get better and to be able to do most of the things she used to do  OBJECTIVE                                                          TREATMENT  Neuromuscular Re-education: a technique or exercise performed with the goal of improving the level of communication between the body and the brain, such as for balance, motor control, muscle activation patterns, coordination, desensitization, quality of muscle contraction, proprioception, and/or kinesthetic sense needed for successful and safe completion of functional activities.   Hooklying PPT with bilateral bent knee fall out to improve lumbopelvic coordination and decrease psoas tension position and decrease hamstrings use to hold PPT (since pt  continues to report L hamstring cramping with PPT in hooklying).  2 x 10  Standing pallof press with PPT using 5# cable to strengthen core while maintaining lumbo pelvic control   2 x 10 each side  Therapeutic exercise: therapeutic exercises that incorporate ONE parameter at one or more areas of the body to centralize symptoms, develop strength and endurance, range of motion, and flexibility required for successful completion of functional activities.  Supine <> sit via log roll technique x 2 Fair carry over  Hooklying butterfly PPT with lat pull over, focusing on feeling with low back arches and correcting it 2 x 15   Seated Isometric hip adductions to strengthen  hips   2 x 10  Seated Isometric hip Abductions to strengthen hips   2 x 10   Pt cued to leave hip again wall rather than bringing it back in   Standing B hip flexor stretch with UE support on wall   2 x 15 seconds   Pt noted increase stiffness on L side compared to the R  Pt required multimodal cuing for proper technique and to facilitate improved neuromuscular control, strength, range of motion, and functional ability resulting in improved performance and form.   PATIENT EDUCATION:  Education details: Exercise purpose/form. Self management techniques. Education on diagnosis, prognosis, POC, anatomy and physiology of current condition. Education on HEP including handout. Person educated: Patient Education method: Explanation, Demonstration, and Handouts Education comprehension: verbalized understanding, returned demonstration, and needs further education  HOME EXERCISE PROGRAM: Access Code: [email or web address] URL: [email or web address] Date: 02/27/2024 Prepared by: Camie Cleverly  Exercises - Sitting to Supine Roll  - Supine Pelvic Tilt  - 2 x daily - 1 sets - 20 reps - Supine Transversus Abdominis Bracing with Double Leg Fallout  - 2 x daily - 2 sets - 20 reps - Hooklying Shoulder I  - 2 x daily - 2 sets - 20 reps  ASSESSMENT:  CLINICAL IMPRESSION: Completed isometric hip abductions and adduction to begin strengthen the hips. Patient noted increased difficulty with hip abductions compared to the adductions. Patient noted some cramping in lateral hips after completion of second set of hip abductions.Completed standing hip flexor stretch due to patient noted some pain when going to raise leg against the wall, noted her L side was tighter than the R. Completed standing pallof press with PPT to strengthen core while maintain lumbo pelvic control. Patient required sitting breaks in between sets due to fatigue in the legs from prior LE strengthen exercises. Patient reported no change  in pain upon completion of session. Patient would benefit from continued management of limiting condition by skilled physical therapist to address remaining impairments and functional limitations to work towards stated goals and return to PLOF or maximal functional independence.   Mechanical sensitivities: lumbar load, neural tension, left lumbar sidebending Multifidi:  Psoas: L tighter than R (per pt report in standing stretch)  From initial PT evaluation on 02/23/24:  Patient is a 58 y.o. female referred to outpatient physical therapy with a medical diagnosis of  greater trochanteric pain syndrome of the left lower extremity who presents with signs and symptoms consistent with left hip pain chronic low back pain with bilateral radicular symptoms, but concordant at the left hip. Patient had relief of concordant L hip pain with lumbar unloading, pain was reproduced with SLR test, sensitive to foot position, concordant pain was strongly reproduced with left sidebending at the low back. Patient has accompanying numbness and tingling down the L leg,  which is not a common finding in extra-articular hip conditions. She reported the improvement from the RFA did not last beyond about 1 year ago. Hip exam did not reproduce intra-articular pain but was limited by guarding. She did report some improvement with ultrasound-guided steroid injection to the left glutal tendons earlier this week but still experienced breathtaking concordant pain today during examination in the clinic. She has radiographic evidence of L and likely R glute med tears and tendinopathy, and this will be addressed in PT along with GTPS and lumbar sources of left hip pain. She appears to be sensitive to the following mechanical stressors at the lumbar spine: compressive load, left sidebending, neural tension. She appears at risk for falls and would likely benefit from using a RW at all times to help unload the spine as she ambulates with stooped  posture that increases load on the low back. Patient presents with significant pain, ROM, muscle guarding, paresthesia, nerve tension, joint stiffness, posture, gait, muscle performance (strength/power/endurance), knowledge, balance, and activity tolerance impairments that are limiting ability to complete usual activities including basic mobility, exercising, walking, standing, homemaking, yardwork, lifting, social life, sleeping, standing, traveling without difficulty. Patient will benefit from skilled physical therapy intervention to address current body structure impairments and activity limitations to improve function and work towards goals set in current POC in order to return to prior level of function or maximal functional improvement.   OBJECTIVE IMPAIRMENTS: Abnormal gait, decreased activity tolerance, decreased balance, decreased coordination, decreased endurance, decreased knowledge of condition, decreased knowledge of use of DME, decreased mobility, difficulty walking, decreased ROM, decreased strength, hypomobility, increased muscle spasms, impaired flexibility, improper body mechanics, postural dysfunction, obesity, and pain.   ACTIVITY LIMITATIONS: carrying, lifting, bending, sitting, standing, squatting, sleeping, stairs, transfers, bed mobility, dressing, hygiene/grooming, and locomotion level  PARTICIPATION LIMITATIONS: meal prep, cleaning, laundry, community activity, and yard work  PERSONAL FACTORS: Age, limited success in PT for similar pain in the past, Fitness, Past/current experiences, Time since onset of injury/illness/exacerbation, and 3+ comorbidities: allergic rhinitis; Arthritis of right knee; Chronic back pain; Closed fracture of posterior wall of left acetabulum (HCC); Closed nondisplaced fracture of anterior wall of left acetabulum (HCC); DDD (degenerative disc disease), lumbar; Depression; Essential hypertension; Knee pain, bilateral; Localized osteoarthritis of right knee;  Microcytic hypochromic anemia; OSA (obstructive sleep apnea); Pain in both feet; Pain medication agreement signed; Elevated LFTs; Gallstones; Jaundice; Choledocholithiasis; and Obesity, Class III, BMI 40-49.9 (morbid obesity) (HCC) on their problem list. she  has a past medical history of Anemia, Anxiety, Arthritis, Chronic back pain, Diabetes mellitus (HCC), H/O degenerative disc disease, Hypertension, Peroneal tendon rupture, right, initial encounter (2025), Sciatica, and Sleep apnea. she  has a past surgical history that includes Total knee arthroplasty (Right, 04/09/2021); breast bioosy; lumbar osteopenia (2023); Hysteroscopy; Intraoperative cholangiogram (N/A, 06/16/2021); Cholecystectomy; and Repair peroneal longus tendon with tubularization and anastomosis to peroneus brevis tendon 2. Tenolysis peroneus brevis tendon right lateral ankle (Right, 07/22/2023) are also affecting patient's functional outcome.   REHAB POTENTIAL: Fair due to limited improvements from PT for similar conditions in the past  CLINICAL DECISION MAKING: Evolving/moderate complexity  EVALUATION COMPLEXITY: Moderate   GOALS: Goals reviewed with patient? No  SHORT TERM GOALS: Target date: 03/08/2024  Patient will be independent with initial home exercise program for self-management of symptoms. Baseline: Initial HEP provided at IE (02/23/24); Goal status: In progress  LONG TERM GOALS: Target date: 05/17/2024  Patient will be independent with a long-term home exercise program for self-management of symptoms.  Baseline: Initial HEP provided at IE (02/23/24); Goal status: In progress  2.  Patient will demonstrate improved Modified Oswestry Disability Index (mODI) to equal or less than 30% to demonstrate improvement in overall condition and self-reported functional ability.  Baseline: 44% (02/23/24); Goal status: In progress  3.  Patient will complete 5 Time Sit To Stand in equal or less than 15 seconds with no UE  support from 18.5 inch or less surface to demonstrate improved LE strength, power, and balance for functional mobility.  Baseline: unable to stand up without B UE support (02/23/24); Goal status: In progress  4.  Patient will ambulate equal or greater than 1300 feet during 6 Minute Walk Test with LRAD to improve her community mobility.  Baseline: difficulty walking to/from clinic with no AD, to be formally tested at later date (02/23/24); Goal status: In progress  5.  Patient will demonstrate improvement in Patient Specific Functional Scale (PSFS) of equal or greater than 8/10 points to reflect clinically significant improvement in patient's most valued functional activities. Baseline: to be measured at visit 2 as appropriate (02/23/24); 2.3/10 (02/27/2024);  Goal status: In progress  6.  Patient will report NPRS equal or less than 3/10 during functional activities during the last 2 weeks to improve their abilitly to complete community, work and/or recreational activities with less limitation. Baseline: 10/10 (02/23/24); Goal status: In progress   PLAN:  PT FREQUENCY: 2x/week  PT DURATION: 12 weeks  PLANNED INTERVENTIONS: 97164- PT Re-evaluation, 97750- Physical Performance Testing, 97110-Therapeutic exercises, 97530- Therapeutic activity, [id number]- Neuromuscular re-education, 97535- Self Care, [id number]- Manual therapy, [id number]- Gait training, [phone]- Electrical stimulation (unattended), [phone]- Ionotophoresis 4mg /ml Dexamethasone , 79439 (1-2 muscles), 20561 (3+ muscles)- Dry Needling, Patient/Family education, Cryotherapy, and Moist heat  PLAN FOR NEXT SESSION:  *** Consider adding functional strengthening exercises such as STS. Update HEP as appropriate, education on mechanical stressors and modifications of activities to avoid them, recover motor control and awareness of modifiers to mechanical sensitivities, retrain motor patterns, regain ROM, improve strength and resilience needed for  performing  desired functional performance with appropriate ROM, strength, power, and endurance. Manual therapy as needed.  Vernell Rise, SPT 03/06/24  [facility] [address] [place], [place] [address] P: [phone] I F: [phone]

## 2024-03-07 ENCOUNTER — Ambulatory Visit: Admitting: Physical Therapy

## 2024-03-07 DIAGNOSIS — Z9181 History of falling: Secondary | ICD-10-CM

## 2024-03-07 DIAGNOSIS — M5459 Other low back pain: Secondary | ICD-10-CM

## 2024-03-07 DIAGNOSIS — M25552 Pain in left hip: Secondary | ICD-10-CM

## 2024-03-07 DIAGNOSIS — R2689 Other abnormalities of gait and mobility: Secondary | ICD-10-CM

## 2024-03-07 DIAGNOSIS — M792 Neuralgia and neuritis, unspecified: Secondary | ICD-10-CM

## 2024-03-13 ENCOUNTER — Ambulatory Visit: Admitting: Orthopedic Surgery

## 2024-03-13 NOTE — Therapy (Unsigned)
 OUTPATIENT PHYSICAL THERAPY TREATMENT   Patient Name: Ariel Davis MRN: [id number] DOB:[birth year], 58 y.o., female Today's Date: 03/13/2024  END OF SESSION:   Past Medical History:  Diagnosis Date   Anemia    Anxiety    Arthritis    Chronic back pain    Diabetes mellitus without complication (HCC)    H/O degenerative disc disease    Hypertension    Peroneal tendon rupture, right, initial encounter 2025   Sciatica    Sleep apnea    cpap   Past Surgical History:  Procedure Laterality Date   breast bioosy     CHOLECYSTECTOMY     ENDOSCOPIC RETROGRADE CHOLANGIOPANCREATOGRAPHY (ERCP) WITH PROPOFOL  N/A 06/17/2021   Procedure: ENDOSCOPIC RETROGRADE CHOLANGIOPANCREATOGRAPHY (ERCP) WITH PROPOFOL ;  Surgeon: Jinny Carmine, MD;  Location: [facility];  Service: Endoscopy;  Laterality: N/A;   HYSTEROSCOPY     INTRAOPERATIVE CHOLANGIOGRAM N/A 06/16/2021   Procedure: INTRAOPERATIVE CHOLANGIOGRAM;  Surgeon: Rodolph Romano, MD;  Location: [facility];  Service: General;  Laterality: N/A;   LIGAMENT REPAIR Right 07/22/2023   Procedure: REPAIR, LIGAMENT;  Surgeon: Ashley Soulier, DPM;  Location: [facility];  Service: Orthopedics/Podiatry;  Laterality: Right;   TOTAL KNEE ARTHROPLASTY Right 04/09/2021   Patient Active Problem List   Diagnosis Date Noted   Obesity, Class III, BMI 40-49.9 (morbid obesity) (HCC) 06/18/2021   Acute cholecystitis 06/17/2021   Choledocholithiasis    Elevated LFTs 06/15/2021   Gallstones    Jaundice    Arthritis of right knee 01/31/2018   DDD (degenerative disc disease), lumbar 01/31/2018   Pain medication agreement signed 01/31/2018   Chronic back pain 01/02/2018   Pain in both feet 08/31/2017   Knee pain, bilateral 07/05/2017   Depression 06/07/2017   OSA (obstructive sleep apnea) 05/12/2017   Closed nondisplaced fracture of anterior wall of left acetabulum (HCC) 08/27/2016   Localized osteoarthritis of right knee 08/27/2016   Closed fracture of  posterior wall of left acetabulum (HCC) 08/23/2016   Essential hypertension 06/21/2016   Allergic rhinitis 12/06/2013   Microcytic hypochromic anemia 12/06/2013   PCP: Gayle Verneita PARAS, NP  REFERRING PROVIDER: Curtistine DOROTHA Crandall, DO  REFERRING DIAG:  greater trochanteric pain syndrome of the left lower extremity  THERAPY DIAG:  History of falling  Other low back pain  Neuralgia and neuritis  Other abnormalities of gait and mobility  Pain in left hip  Rationale for Evaluation and Treatment: Rehabilitation  ONSET DATE: 2018  SUBJECTIVE:   PERTINENT HISTORY: Patient is a 58 y.o. 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 This patient's chief complaints consist of left lateral hip pain radiating down left leg with numbness and tingling under foot, and also radiating at low back, leading to the following functional deficits: difficulty with usual activities including basic mobility, exercising, walking, standing, homemaking, yardwork, lifting, social life, sleeping, standing, traveling. Relevant past medical history and comorbidities include the following: she has Allergic rhinitis; Arthritis of right knee; Chronic back pain; Closed fracture of posterior wall of left acetabulum (HCC); Closed nondisplaced fracture of anterior wall of left acetabulum (HCC); DDD (degenerative disc disease), lumbar; Depression; Essential hypertension; Knee pain, bilateral; Localized osteoarthritis of right knee; Microcytic hypochromic anemia; OSA (obstructive sleep apnea); Pain in both feet; Pain medication agreement signed; Elevated LFTs; Gallstones; Jaundice; Choledocholithiasis; and Obesity, Class III, BMI 40-49.9 (morbid obesity) (HCC) on their problem list. she  has a past medical history of Anemia,  Anxiety, Arthritis, Chronic back pain, Diabetes mellitus (HCC), H/O degenerative disc disease,  Hypertension, Peroneal tendon rupture, right, initial encounter (2025), Sciatica, and Sleep apnea. she  has a past surgical history that includes Total knee arthroplasty (Right, 04/09/2021); breast bioosy; lumbar osteopenia (2023); Hysteroscopy; Intraoperative cholangiogram (N/A, 06/16/2021); Cholecystectomy; and Repair peroneal longus tendon with tubularization and anastomosis to peroneus brevis tendon 2. Tenolysis peroneus brevis tendon right lateral ankle (Right, 07/22/2023). Patient denies hx of cancer, stroke, seizures, lung problems, heart problems, unexplained weight loss, and spinal surgery. She states she urinates a lot. This has been going on for 4-5 months. She has not spoken to her doctors about it. She states she cannot hold her urine long if she has an urge to go. She states her hands cramp up and she drops things sometimes. She states her blood is not circulating like it should in the left leg. She states her left hand goes to sleep all the time too.   Exercise history: has 5#DB set at home.   SUBJECTIVE STATEMENT: *** PAIN: NPRS: ***  From initial PT evaluation on 02/23/24:  NPRS: Current: 7/10 not that bad,  Best: 7/10, Worst: 10/10, Worst in the last 2 weeks: 10/10 (went to hospital).  Pain location: left glute Pain description: constant with burning (before injection) at left glute, intermittent radiation down left leg to bottom of foot. Also goes up towards the back. Numbness/tingling: left leg and foot. Lateral ankle and foot on the right.  Aggravating factors: jumping around in the pool, standing up too long, sitting down too long Relieving factors: injection 3 days ago (less burning), heat helped at first, ice helped at first 24 hour clock: no time of day is better  PRECAUTIONS: Fall, not supposed to lift over 5-10 lbs.   WEIGHT BEARING RESTRICTIONS: No  FALLS:  Has patient fallen in last 6 months? Yes. Number of falls 1, she was coming in the house and she is not sure  if she fell over the threshold. She fell on the left side.  She has a cane and walker in the car and she uses those when her pain is at it's worst.   PLOF: going to the gym, walking (I stayed at the track), liked water aerobics, and loved yardwork. Was not limited in her ability to do these things.   PATIENT GOALS: to get better and to be able to do most of the things she used to do  OBJECTIVE                                                          TREATMENT  Neuromuscular Re-education: a technique or exercise performed with the goal of improving the level of communication between the body and the brain, such as for balance, motor control, muscle activation patterns, coordination, desensitization, quality of muscle contraction, proprioception, and/or kinesthetic sense needed for successful and safe completion of functional activities.   Hooklying PPT with bilateral bent knee fall out to improve lumbopelvic coordination and decrease psoas tension position and decrease hamstrings use to hold PPT (since pt continues to report L hamstring cramping with PPT in hooklying).  2 x 10  Standing pallof press with PPT using 5# cable to strengthen core while maintaining lumbo pelvic control   2 x 10 each side  Therapeutic exercise:  therapeutic exercises that incorporate ONE parameter at one or more areas of the body to centralize symptoms, develop strength and endurance, range of motion, and flexibility required for successful completion of functional activities.  Supine <> sit via log roll technique x 2 Fair carry over  Hooklying butterfly PPT with lat pull over, focusing on feeling with low back arches and correcting it 2 x 15   Seated Isometric hip adductions to strengthen hips   2 x 10  Seated Isometric hip Abductions to strengthen hips   2 x 10   Pt cued to leave hip again wall rather than bringing it back in   Standing B hip flexor stretch with UE support on wall   2 x 15 seconds   Pt  noted increase stiffness on L side compared to the R  Pt required multimodal cuing for proper technique and to facilitate improved neuromuscular control, strength, range of motion, and functional ability resulting in improved performance and form.   PATIENT EDUCATION:  Education details: Exercise purpose/form. Self management techniques. Education on diagnosis, prognosis, POC, anatomy and physiology of current condition. Education on HEP including handout. Person educated: Patient Education method: Explanation, Demonstration, and Handouts Education comprehension: verbalized understanding, returned demonstration, and needs further education  HOME EXERCISE PROGRAM: Access Code: [email or web address] URL: [email or web address] Date: 02/27/2024 Prepared by: Camie Cleverly  Exercises - Sitting to Supine Roll  - Supine Pelvic Tilt  - 2 x daily - 1 sets - 20 reps - Supine Transversus Abdominis Bracing with Double Leg Fallout  - 2 x daily - 2 sets - 20 reps - Hooklying Shoulder I  - 2 x daily - 2 sets - 20 reps  ASSESSMENT:  CLINICAL IMPRESSION: ***   Patient would benefit from continued management of limiting condition by skilled physical therapist to address remaining impairments and functional limitations to work towards stated goals and return to PLOF or maximal functional independence.   Mechanical sensitivities: lumbar load, neural tension, left lumbar sidebending Multifidi:  Psoas: L tighter than R (per pt report in standing stretch)  From initial PT evaluation on 02/23/24:  Patient is a 58 y.o. female referred to outpatient physical therapy with a medical diagnosis of  greater trochanteric pain syndrome of the left lower extremity who presents with signs and symptoms consistent with left hip pain chronic low back pain with bilateral radicular symptoms, but concordant at the left hip. Patient had relief of concordant L hip pain with lumbar unloading, pain was reproduced with SLR test,  sensitive to foot position, concordant pain was strongly reproduced with left sidebending at the low back. Patient has accompanying numbness and tingling down the L leg, which is not a common finding in extra-articular hip conditions. She reported the improvement from the RFA did not last beyond about 1 year ago. Hip exam did not reproduce intra-articular pain but was limited by guarding. She did report some improvement with ultrasound-guided steroid injection to the left glutal tendons earlier this week but still experienced breathtaking concordant pain today during examination in the clinic. She has radiographic evidence of L and likely R glute med tears and tendinopathy, and this will be addressed in PT along with GTPS and lumbar sources of left hip pain. She appears to be sensitive to the following mechanical stressors at the lumbar spine: compressive load, left sidebending, neural tension. She appears at risk for falls and would likely benefit from using a RW at all times to help unload the spine as she ambulates with stooped  posture that increases load on the low back. Patient presents with significant pain, ROM, muscle guarding, paresthesia, nerve tension, joint stiffness, posture, gait, muscle performance (strength/power/endurance), knowledge, balance, and activity tolerance impairments that are limiting ability to complete usual activities including basic mobility, exercising, walking, standing, homemaking, yardwork, lifting, social life, sleeping, standing, traveling without difficulty. Patient will benefit from skilled physical therapy intervention to address current body structure impairments and activity limitations to improve function and work towards goals set in current POC in order to return to prior level of function or maximal functional improvement.   OBJECTIVE IMPAIRMENTS: Abnormal gait, decreased activity tolerance, decreased balance, decreased coordination, decreased endurance, decreased  knowledge of condition, decreased knowledge of use of DME, decreased mobility, difficulty walking, decreased ROM, decreased strength, hypomobility, increased muscle spasms, impaired flexibility, improper body mechanics, postural dysfunction, obesity, and pain.   ACTIVITY LIMITATIONS: carrying, lifting, bending, sitting, standing, squatting, sleeping, stairs, transfers, bed mobility, dressing, hygiene/grooming, and locomotion level  PARTICIPATION LIMITATIONS: meal prep, cleaning, laundry, community activity, and yard work  PERSONAL FACTORS: Age, limited success in PT for similar pain in the past, Fitness, Past/current experiences, Time since onset of injury/illness/exacerbation, and 3+ comorbidities: allergic rhinitis; Arthritis of right knee; Chronic back pain; Closed fracture of posterior wall of left acetabulum (HCC); Closed nondisplaced fracture of anterior wall of left acetabulum (HCC); DDD (degenerative disc disease), lumbar; Depression; Essential hypertension; Knee pain, bilateral; Localized osteoarthritis of right knee; Microcytic hypochromic anemia; OSA (obstructive sleep apnea); Pain in both feet; Pain medication agreement signed; Elevated LFTs; Gallstones; Jaundice; Choledocholithiasis; and Obesity, Class III, BMI 40-49.9 (morbid obesity) (HCC) on their problem list. she  has a past medical history of Anemia, Anxiety, Arthritis, Chronic back pain, Diabetes mellitus (HCC), H/O degenerative disc disease, Hypertension, Peroneal tendon rupture, right, initial encounter (2025), Sciatica, and Sleep apnea. she  has a past surgical history that includes Total knee arthroplasty (Right, 04/09/2021); breast bioosy; lumbar osteopenia (2023); Hysteroscopy; Intraoperative cholangiogram (N/A, 06/16/2021); Cholecystectomy; and Repair peroneal longus tendon with tubularization and anastomosis to peroneus brevis tendon 2. Tenolysis peroneus brevis tendon right lateral ankle (Right, 07/22/2023) are also affecting  patient's functional outcome.   REHAB POTENTIAL: Fair due to limited improvements from PT for similar conditions in the past  CLINICAL DECISION MAKING: Evolving/moderate complexity  EVALUATION COMPLEXITY: Moderate   GOALS: Goals reviewed with patient? No  SHORT TERM GOALS: Target date: 03/08/2024  Patient will be independent with initial home exercise program for self-management of symptoms. Baseline: Initial HEP provided at IE (02/23/24); Goal status: In progress  LONG TERM GOALS: Target date: 05/17/2024  Patient will be independent with a long-term home exercise program for self-management of symptoms.  Baseline: Initial HEP provided at IE (02/23/24); Goal status: In progress  2.  Patient will demonstrate improved Modified Oswestry Disability Index (mODI) to equal or less than 30% to demonstrate improvement in overall condition and self-reported functional ability.  Baseline: 44% (02/23/24); Goal status: In progress  3.  Patient will complete 5 Time Sit To Stand in equal or less than 15 seconds with no UE support from 18.5 inch or less surface to demonstrate improved LE strength, power, and balance for functional mobility.  Baseline: unable to stand up without B UE support (02/23/24); Goal status: In progress  4.  Patient will ambulate equal or greater than 1300 feet during 6 Minute Walk Test with LRAD to improve her community mobility.  Baseline: difficulty walking to/from clinic with no AD, to be formally tested at later date (02/23/24); Goal  status: In progress  5.  Patient will demonstrate improvement in Patient Specific Functional Scale (PSFS) of equal or greater than 8/10 points to reflect clinically significant improvement in patient's most valued functional activities. Baseline: to be measured at visit 2 as appropriate (02/23/24); 2.3/10 (02/27/2024);  Goal status: In progress  6.  Patient will report NPRS equal or less than 3/10 during functional activities during the  last 2 weeks to improve their abilitly to complete community, work and/or recreational activities with less limitation. Baseline: 10/10 (02/23/24); Goal status: In progress   PLAN:  PT FREQUENCY: 2x/week  PT DURATION: 12 weeks  PLANNED INTERVENTIONS: 97164- PT Re-evaluation, 97750- Physical Performance Testing, 97110-Therapeutic exercises, 97530- Therapeutic activity, [id number]- Neuromuscular re-education, 97535- Self Care, [id number]- Manual therapy, [id number]- Gait training, [phone]- Electrical stimulation (unattended), [phone]- Ionotophoresis 4mg /ml Dexamethasone , 79439 (1-2 muscles), 20561 (3+ muscles)- Dry Needling, Patient/Family education, Cryotherapy, and Moist heat  PLAN FOR NEXT SESSION:  *** Consider adding functional strengthening exercises such as STS. Update HEP as appropriate, education on mechanical stressors and modifications of activities to avoid them, recover motor control and awareness of modifiers to mechanical sensitivities, retrain motor patterns, regain ROM, improve strength and resilience needed for  performing desired functional performance with appropriate ROM, strength, power, and endurance. Manual therapy as needed.  Vernell Rise, SPT 03/13/24  [facility] [address] [place], [place] [address] P: [phone] I F: [phone]

## 2024-03-14 ENCOUNTER — Ambulatory Visit: Admitting: Physical Therapy

## 2024-03-14 VITALS — BP 132/83 | HR 76

## 2024-03-14 DIAGNOSIS — R2689 Other abnormalities of gait and mobility: Secondary | ICD-10-CM

## 2024-03-14 DIAGNOSIS — Z9181 History of falling: Secondary | ICD-10-CM

## 2024-03-14 DIAGNOSIS — M792 Neuralgia and neuritis, unspecified: Secondary | ICD-10-CM

## 2024-03-14 DIAGNOSIS — M25552 Pain in left hip: Secondary | ICD-10-CM | POA: Diagnosis not present

## 2024-03-14 DIAGNOSIS — M5459 Other low back pain: Secondary | ICD-10-CM

## 2024-03-15 ENCOUNTER — Encounter: Payer: Self-pay | Admitting: Physical Therapy

## 2024-03-19 NOTE — Therapy (Unsigned)
 OUTPATIENT PHYSICAL THERAPY TREATMENT   Patient Name: Ariel Davis MRN: [id number] DOB:27-Jun-1965, 58 y.o., female Today's Date: 03/20/2024  END OF SESSION:  PT End of Session - 03/20/24 1450     Visit Number 5    Number of Visits 17    Date for Recertification  05/17/24    Authorization Type [employer]ICARE reporting period from 02/23/2024    Progress Note Due on Visit 10    PT Start Time 1445    PT Stop Time 1521    PT Time Calculation (min) 36 min    Activity Tolerance Patient limited by pain    Behavior During Therapy WFL for tasks assessed/performed         Past Medical History:  Diagnosis Date   Anemia    Anxiety    Arthritis    Chronic back pain    Diabetes mellitus without complication (HCC)    H/O degenerative disc disease    Hypertension    Peroneal tendon rupture, right, initial encounter 2025   Sciatica    Sleep apnea    cpap   Past Surgical History:  Procedure Laterality Date   breast bioosy     CHOLECYSTECTOMY     ENDOSCOPIC RETROGRADE CHOLANGIOPANCREATOGRAPHY (ERCP) WITH PROPOFOL  N/A 06/17/2021   Procedure: ENDOSCOPIC RETROGRADE CHOLANGIOPANCREATOGRAPHY (ERCP) WITH PROPOFOL ;  Surgeon: Jinny Carmine, MD;  Location: [facility];  Service: Endoscopy;  Laterality: N/A;   HYSTEROSCOPY     INTRAOPERATIVE CHOLANGIOGRAM N/A 06/16/2021   Procedure: INTRAOPERATIVE CHOLANGIOGRAM;  Surgeon: Rodolph Romano, MD;  Location: [facility];  Service: General;  Laterality: N/A;   LIGAMENT REPAIR Right 07/22/2023   Procedure: REPAIR, LIGAMENT;  Surgeon: Ashley Soulier, DPM;  Location: [facility];  Service: Orthopedics/Podiatry;  Laterality: Right;   TOTAL KNEE ARTHROPLASTY Right 04/09/2021   Patient Active Problem List   Diagnosis Date Noted   Obesity, Class III, BMI 40-49.9 (morbid obesity) (HCC) 06/18/2021   Acute cholecystitis 06/17/2021   Choledocholithiasis    Elevated LFTs 06/15/2021   Gallstones    Jaundice    Arthritis of right knee  01/31/2018   DDD (degenerative disc disease), lumbar 01/31/2018   Pain medication agreement signed 01/31/2018   Chronic back pain 01/02/2018   Pain in both feet 08/31/2017   Knee pain, bilateral 07/05/2017   Depression 06/07/2017   OSA (obstructive sleep apnea) 05/12/2017   Closed nondisplaced fracture of anterior wall of left acetabulum (HCC) 08/27/2016   Localized osteoarthritis of right knee 08/27/2016   Closed fracture of posterior wall of left acetabulum (HCC) 08/23/2016   Essential hypertension 06/21/2016   Allergic rhinitis 12/06/2013   Microcytic hypochromic anemia 12/06/2013   PCP: Gayle Verneita PARAS, NP  REFERRING PROVIDER: Curtistine DOROTHA Crandall, DO  REFERRING DIAG:  greater trochanteric pain syndrome of the left lower extremity  THERAPY DIAG:  Pain in left hip  History of falling  Other low back pain  Neuralgia and neuritis  Other abnormalities of gait and mobility  Rationale for Evaluation and Treatment: Rehabilitation  ONSET DATE: 2018  SUBJECTIVE:   PERTINENT HISTORY: Patient is a 58 y.o. 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 This patient's chief complaints consist of left lateral hip pain radiating down left leg with numbness and tingling under foot, and also radiating at low back, leading to the following functional deficits: difficulty with usual activities including basic mobility, exercising, walking, standing, homemaking, yardwork, lifting, social life, sleeping,  standing, traveling. Relevant past medical history and comorbidities include the following: she has Allergic rhinitis; Arthritis of right knee; Chronic back pain; Closed fracture of posterior wall of left acetabulum (HCC); Closed nondisplaced fracture of anterior wall of left acetabulum (HCC); DDD (degenerative disc disease), lumbar; Depression; Essential hypertension; Knee pain, bilateral;  Localized osteoarthritis of right knee; Microcytic hypochromic anemia; OSA (obstructive sleep apnea); Pain in both feet; Pain medication agreement signed; Elevated LFTs; Gallstones; Jaundice; Choledocholithiasis; and Obesity, Class III, BMI 40-49.9 (morbid obesity) (HCC) on their problem list. she  has a past medical history of Anemia, Anxiety, Arthritis, Chronic back pain, Diabetes mellitus (HCC), H/O degenerative disc disease, Hypertension, Peroneal tendon rupture, right, initial encounter (2025), Sciatica, and Sleep apnea. she  has a past surgical history that includes Total knee arthroplasty (Right, 04/09/2021); breast bioosy; lumbar osteopenia (2023); Hysteroscopy; Intraoperative cholangiogram (N/A, 06/16/2021); Cholecystectomy; and Repair peroneal longus tendon with tubularization and anastomosis to peroneus brevis tendon 2. Tenolysis peroneus brevis tendon right lateral ankle (Right, 07/22/2023). Patient denies hx of cancer, stroke, seizures, lung problems, heart problems, unexplained weight loss, and spinal surgery. She states she urinates a lot. This has been going on for 4-5 months. She has not spoken to her doctors about it. She states she cannot hold her urine long if she has an urge to go. She states her hands cramp up and she drops things sometimes. She states her blood is not circulating like it should in the left leg. She states her left hand goes to sleep all the time too.   Exercise history: has 5#DB set at home.   SUBJECTIVE STATEMENT: Patient called her doctor about her episode during last session. Patient reports the whole left feels feels heavy and like she is wearing a bunch of socks.  PAIN: NPRS: 7/10 in the low back, going down L leg all the way down to the foot.  From initial PT evaluation on 02/23/24:  NPRS: Current: 7/10 not that bad,  Best: 7/10, Worst: 10/10, Worst in the last 2 weeks: 10/10 (went to hospital).  Pain location: left glute Pain description: constant with  burning (before injection) at left glute, intermittent radiation down left leg to bottom of foot. Also goes up towards the back. Numbness/tingling: left leg and foot. Lateral ankle and foot on the right.  Aggravating factors: jumping around in the pool, standing up too long, sitting down too long Relieving factors: injection 3 days ago (less burning), heat helped at first, ice helped at first 24 hour clock: no time of day is better  PRECAUTIONS: Fall, not supposed to lift over 5-10 lbs.   WEIGHT BEARING RESTRICTIONS: No  FALLS: Has patient fallen in last 6 months? Yes. Number of falls 1, she was coming in the house and she is not sure if she fell over the threshold. She fell on the left side.  She has a cane and walker in the car and she uses those when her pain is at it's worst.   PLOF: going to the gym, walking (I stayed at the track), liked water aerobics, and loved yardwork. Was not limited in her ability to do these things.   PATIENT GOALS: to get better and to be able to do most of the things she used to do  OBJECTIVE  TREATMENT  Neuromuscular Re-education: a technique or exercise performed with the goal of improving the level of communication between the body and the brain, such as for balance, motor control, muscle activation patterns, coordination, desensitization, quality of muscle contraction, proprioception, and/or kinesthetic sense needed for successful and safe completion of functional activities.   Hooklying PPT with bilateral bent knee fall out to improve lumbopelvic coordination and decrease psoas tension position and decrease hamstrings use to hold PPT (since pt continues to report L hamstring cramping with PPT in hooklying).  2 x 10  Standing pallof press with PPT using Blue TB to strengthen core while maintaining lumbo pelvic control   2 x 10 each side   Cued for PPT  Hooklying butterfly PPT with lat pull over to improve  lumbo pelvic control with decreased psoas tension   2 x 10  Therapeutic exercise: therapeutic exercises that incorporate ONE parameter at one or more areas of the body to centralize symptoms, develop strength and endurance, range of motion, and flexibility required for successful completion of functional activities.   Supine L piriformis to reduce tension   10 x 5 seconds    Supine short arc quads to strengthen LE    2 x 10 each side   Cued to point toes to avoid neural tension   Seated isometric hip adductions with green ball to strengthen legs   2 x 10 with 3 seconds   Seated lumbar roll outs with theraball to help reduce tension in low back   2 x 10 flexion   STS with B UE support on knees to functionally strengthen LE    2 x 5   Standing B heel raises with B UE support on TM   1 x 15    Seated clamshells to improve Lateral hip strength   2 x 10   Pt required multimodal cuing for proper technique and to facilitate improved neuromuscular control, strength, range of motion, and functional ability resulting in improved performance and form.    PATIENT EDUCATION:  Education details: Exercise purpose/form. Self management techniques. Education on diagnosis, prognosis, POC, anatomy and physiology of current condition. Education on HEP including handout. Person educated: Patient Education method: Explanation, Demonstration, and Handouts Education comprehension: verbalized understanding, returned demonstration, and needs further education  HOME EXERCISE PROGRAM: Access Code: [email or web address] URL: [email or web address] Date: 03/14/2024 Prepared by: Vernell Rise  Exercises - Sitting to Supine Roll  - Supine Pelvic Tilt  - 2 x daily - 1 sets - 20 reps - Supine Transversus Abdominis Bracing with Double Leg Fallout  - 2 x daily - 2 sets - 20 reps - Hooklying Shoulder I  - 2 x daily - 2 sets - 20 reps - Supine Piriformis Stretch with Foot on Ground  - 1 x daily - 2 sets  - 15 second hold - Supine Knee Extension Strengthening  - 1 x daily - 3 sets - 10 reps  ASSESSMENT:  CLINICAL IMPRESSION: Patient stated that with the hooklying PPT with knees bent out to the side that her back felt better with repetitions. Also noted some cramping mainly in L hip however caught a cramp in both hips  upon completion of second set. Patient was cued with short arc quads to complete wit her toes pointed to avoid neural tension. Completed STS from chair with B UE assistance on knees. Patient reported L knee pain upon full WB. Patient noted pain in low back with pallof press, cued for PPT and pain decreased. Needed  a seated rest break in between sets due to LE fatigue. Patient noted more nausea during lumbar roll outs and completed 2 sets of 10 before feeling it. Patient rated pain upon completion of session at a 7/10 in the low back, no change in her symptoms down the L leg. Patient would benefit from continued management of limiting condition by skilled physical therapist to address remaining impairments and functional limitations to work towards stated goals and return to PLOF or maximal functional independence.   Mechanical sensitivities: lumbar load, neural tension, left lumbar sidebending Multifidi:  Psoas: L tighter than R (per pt report in standing stretch)  From initial PT evaluation on 02/23/24:  Patient is a 58 y.o. female referred to outpatient physical therapy with a medical diagnosis of  greater trochanteric pain syndrome of the left lower extremity who presents with signs and symptoms consistent with left hip pain chronic low back pain with bilateral radicular symptoms, but concordant at the left hip. Patient had relief of concordant L hip pain with lumbar unloading, pain was reproduced with SLR test, sensitive to foot position, concordant pain was strongly reproduced with left sidebending at the low back. Patient has accompanying numbness and tingling down the L leg, which is  not a common finding in extra-articular hip conditions. She reported the improvement from the RFA did not last beyond about 1 year ago. Hip exam did not reproduce intra-articular pain but was limited by guarding. She did report some improvement with ultrasound-guided steroid injection to the left glutal tendons earlier this week but still experienced breathtaking concordant pain today during examination in the clinic. She has radiographic evidence of L and likely R glute med tears and tendinopathy, and this will be addressed in PT along with GTPS and lumbar sources of left hip pain. She appears to be sensitive to the following mechanical stressors at the lumbar spine: compressive load, left sidebending, neural tension. She appears at risk for falls and would likely benefit from using a RW at all times to help unload the spine as she ambulates with stooped posture that increases load on the low back. Patient presents with significant pain, ROM, muscle guarding, paresthesia, nerve tension, joint stiffness, posture, gait, muscle performance (strength/power/endurance), knowledge, balance, and activity tolerance impairments that are limiting ability to complete usual activities including basic mobility, exercising, walking, standing, homemaking, yardwork, lifting, social life, sleeping, standing, traveling without difficulty. Patient will benefit from skilled physical therapy intervention to address current body structure impairments and activity limitations to improve function and work towards goals set in current POC in order to return to prior level of function or maximal functional improvement.   OBJECTIVE IMPAIRMENTS: Abnormal gait, decreased activity tolerance, decreased balance, decreased coordination, decreased endurance, decreased knowledge of condition, decreased knowledge of use of DME, decreased mobility, difficulty walking, decreased ROM, decreased strength, hypomobility, increased muscle spasms, impaired  flexibility, improper body mechanics, postural dysfunction, obesity, and pain.   ACTIVITY LIMITATIONS: carrying, lifting, bending, sitting, standing, squatting, sleeping, stairs, transfers, bed mobility, dressing, hygiene/grooming, and locomotion level  PARTICIPATION LIMITATIONS: meal prep, cleaning, laundry, community activity, and yard work  PERSONAL FACTORS: Age, limited success in PT for similar pain in the past, Fitness, Past/current experiences, Time since onset of injury/illness/exacerbation, and 3+ comorbidities: allergic rhinitis; Arthritis of right knee; Chronic back pain; Closed fracture of posterior wall of left acetabulum (HCC); Closed nondisplaced fracture of anterior wall of left acetabulum (HCC); DDD (degenerative disc disease), lumbar; Depression; Essential hypertension; Knee pain, bilateral; Localized osteoarthritis of right  knee; Microcytic hypochromic anemia; OSA (obstructive sleep apnea); Pain in both feet; Pain medication agreement signed; Elevated LFTs; Gallstones; Jaundice; Choledocholithiasis; and Obesity, Class III, BMI 40-49.9 (morbid obesity) (HCC) on their problem list. she  has a past medical history of Anemia, Anxiety, Arthritis, Chronic back pain, Diabetes mellitus (HCC), H/O degenerative disc disease, Hypertension, Peroneal tendon rupture, right, initial encounter (2025), Sciatica, and Sleep apnea. she  has a past surgical history that includes Total knee arthroplasty (Right, 04/09/2021); breast bioosy; lumbar osteopenia (2023); Hysteroscopy; Intraoperative cholangiogram (N/A, 06/16/2021); Cholecystectomy; and Repair peroneal longus tendon with tubularization and anastomosis to peroneus brevis tendon 2. Tenolysis peroneus brevis tendon right lateral ankle (Right, 07/22/2023) are also affecting patient's functional outcome.   REHAB POTENTIAL: Fair due to limited improvements from PT for similar conditions in the past  CLINICAL DECISION MAKING: Evolving/moderate  complexity  EVALUATION COMPLEXITY: Moderate  GOALS: Goals reviewed with patient? No  SHORT TERM GOALS: Target date: 03/08/2024  Patient will be independent with initial home exercise program for self-management of symptoms. Baseline: Initial HEP provided at IE (02/23/24) Goal status: MET  LONG TERM GOALS: Target date: 05/17/2024  Patient will be independent with a long-term home exercise program for self-management of symptoms.  Baseline: Initial HEP provided at IE (02/23/24); Goal status: In progress  2.  Patient will demonstrate improved Modified Oswestry Disability Index (mODI) to equal or less than 30% to demonstrate improvement in overall condition and self-reported functional ability.  Baseline: 44% (02/23/24); Goal status: In progress  3.  Patient will complete 5 Time Sit To Stand in equal or less than 15 seconds with no UE support from 18.5 inch or less surface to demonstrate improved LE strength, power, and balance for functional mobility.  Baseline: unable to stand up without B UE support (02/23/24); Goal status: In progress  4.  Patient will ambulate equal or greater than 1300 feet during 6 Minute Walk Test with LRAD to improve her community mobility.  Baseline: difficulty walking to/from clinic with no AD, to be formally tested at later date (02/23/24); Goal status: In progress  5.  Patient will demonstrate improvement in Patient Specific Functional Scale (PSFS) of equal or greater than 8/10 points to reflect clinically significant improvement in patient's most valued functional activities. Baseline: to be measured at visit 2 as appropriate (02/23/24); 2.3/10 (02/27/2024);  Goal status: In progress  6.  Patient will report NPRS equal or less than 3/10 during functional activities during the last 2 weeks to improve their abilitly to complete community, work and/or recreational activities with less limitation. Baseline: 10/10 (02/23/24); Goal status: In  progress  PLAN:  PT FREQUENCY: 2x/week  PT DURATION: 12 weeks  PLANNED INTERVENTIONS: 97164- PT Re-evaluation, 97750- Physical Performance Testing, 97110-Therapeutic exercises, 97530- Therapeutic activity, [id number]- Neuromuscular re-education, 97535- Self Care, [id number]- Manual therapy, [phone]- Gait training, [phone]- Electrical stimulation (unattended), [phone]- Ionotophoresis 4mg /ml Dexamethasone , 79439 (1-2 muscles), 20561 (3+ muscles)- Dry Needling, Patient/Family education, Cryotherapy, and Moist heat  PLAN FOR NEXT SESSION:  Return to hip strengthening exercises and Consider adding resistance to seated clamshells (if tolerated) as well as bridging with PPT. Update HEP as appropriate, education on mechanical stressors and modifications of activities to avoid them, recover motor control and awareness of modifiers to mechanical sensitivities, retrain motor patterns, regain ROM, improve strength and resilience needed for  performing desired functional performance with appropriate ROM, strength, power, and endurance. Manual therapy as needed.  Vernell Rise, SPT 03/20/2024  Camie SAUNDERS. Juli, PT, DPT, Cert. MDT, PRA-C 03/20/2024, 4:29  PM  [facility] [address] [place], [place] [address] P: [phone] I F: [phone]

## 2024-03-20 ENCOUNTER — Ambulatory Visit: Admitting: Physical Therapy

## 2024-03-20 ENCOUNTER — Encounter: Payer: Self-pay | Admitting: Physical Therapy

## 2024-03-20 DIAGNOSIS — Z9181 History of falling: Secondary | ICD-10-CM

## 2024-03-20 DIAGNOSIS — M792 Neuralgia and neuritis, unspecified: Secondary | ICD-10-CM

## 2024-03-20 DIAGNOSIS — M25552 Pain in left hip: Secondary | ICD-10-CM | POA: Diagnosis not present

## 2024-03-20 DIAGNOSIS — R2689 Other abnormalities of gait and mobility: Secondary | ICD-10-CM

## 2024-03-20 DIAGNOSIS — M5459 Other low back pain: Secondary | ICD-10-CM

## 2024-03-21 NOTE — Therapy (Unsigned)
 OUTPATIENT PHYSICAL THERAPY TREATMENT   Patient Name: Ariel Davis MRN: [id number] DOB:Jan 18, 1966, 58 y.o., female Today's Date: 03/22/2024  END OF SESSION:  PT End of Session - 03/22/24 1516     Visit Number 6    Number of Visits 17    Date for Recertification  05/17/24    Authorization Type [employer]ICARE reporting period from 02/23/2024    Progress Note Due on Visit 10    PT Start Time 1519    PT Stop Time 1559    PT Time Calculation (min) 40 min    Activity Tolerance Patient limited by pain;Patient tolerated treatment well    Behavior During Therapy WFL for tasks assessed/performed          Past Medical History:  Diagnosis Date   Anemia    Anxiety    Arthritis    Chronic back pain    Diabetes mellitus without complication (HCC)    H/O degenerative disc disease    Hypertension    Peroneal tendon rupture, right, initial encounter 2025   Sciatica    Sleep apnea    cpap   Past Surgical History:  Procedure Laterality Date   breast bioosy     CHOLECYSTECTOMY     ENDOSCOPIC RETROGRADE CHOLANGIOPANCREATOGRAPHY (ERCP) WITH PROPOFOL  N/A 06/17/2021   Procedure: ENDOSCOPIC RETROGRADE CHOLANGIOPANCREATOGRAPHY (ERCP) WITH PROPOFOL ;  Surgeon: Jinny Carmine, MD;  Location: [facility];  Service: Endoscopy;  Laterality: N/A;   HYSTEROSCOPY     INTRAOPERATIVE CHOLANGIOGRAM N/A 06/16/2021   Procedure: INTRAOPERATIVE CHOLANGIOGRAM;  Surgeon: Rodolph Romano, MD;  Location: [facility];  Service: General;  Laterality: N/A;   LIGAMENT REPAIR Right 07/22/2023   Procedure: REPAIR, LIGAMENT;  Surgeon: Ashley Soulier, DPM;  Location: [facility];  Service: Orthopedics/Podiatry;  Laterality: Right;   TOTAL KNEE ARTHROPLASTY Right 04/09/2021   Patient Active Problem List   Diagnosis Date Noted   Obesity, Class III, BMI 40-49.9 (morbid obesity) (HCC) 06/18/2021   Acute cholecystitis 06/17/2021   Choledocholithiasis    Elevated LFTs 06/15/2021   Gallstones     Jaundice    Arthritis of right knee 01/31/2018   DDD (degenerative disc disease), lumbar 01/31/2018   Pain medication agreement signed 01/31/2018   Chronic back pain 01/02/2018   Pain in both feet 08/31/2017   Knee pain, bilateral 07/05/2017   Depression 06/07/2017   OSA (obstructive sleep apnea) 05/12/2017   Closed nondisplaced fracture of anterior wall of left acetabulum (HCC) 08/27/2016   Localized osteoarthritis of right knee 08/27/2016   Closed fracture of posterior wall of left acetabulum (HCC) 08/23/2016   Essential hypertension 06/21/2016   Allergic rhinitis 12/06/2013   Microcytic hypochromic anemia 12/06/2013   PCP: Gayle Verneita PARAS, NP  REFERRING PROVIDER: Curtistine DOROTHA Crandall, DO  REFERRING DIAG:  greater trochanteric pain syndrome of the left lower extremity  THERAPY DIAG:  Pain in left hip  History of falling  Other abnormalities of gait and mobility  Other low back pain  Neuralgia and neuritis  Rationale for Evaluation and Treatment: Rehabilitation  ONSET DATE: 2018  SUBJECTIVE:   PERTINENT HISTORY: Patient is a 58 y.o. 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 This patient's chief complaints consist of left lateral hip pain radiating down left leg with numbness and tingling under foot, and also radiating at low back, leading to the following functional deficits: difficulty with usual activities including basic mobility, exercising, walking, standing, homemaking, yardwork,  lifting, social life, sleeping, standing, traveling. Relevant past medical history and comorbidities include the following: she has Allergic rhinitis; Arthritis of right knee; Chronic back pain; Closed fracture of posterior wall of left acetabulum (HCC); Closed nondisplaced fracture of anterior wall of left acetabulum (HCC); DDD (degenerative disc disease), lumbar; Depression; Essential  hypertension; Knee pain, bilateral; Localized osteoarthritis of right knee; Microcytic hypochromic anemia; OSA (obstructive sleep apnea); Pain in both feet; Pain medication agreement signed; Elevated LFTs; Gallstones; Jaundice; Choledocholithiasis; and Obesity, Class III, BMI 40-49.9 (morbid obesity) (HCC) on their problem list. she  has a past medical history of Anemia, Anxiety, Arthritis, Chronic back pain, Diabetes mellitus (HCC), H/O degenerative disc disease, Hypertension, Peroneal tendon rupture, right, initial encounter (2025), Sciatica, and Sleep apnea. she  has a past surgical history that includes Total knee arthroplasty (Right, 04/09/2021); breast bioosy; lumbar osteopenia (2023); Hysteroscopy; Intraoperative cholangiogram (N/A, 06/16/2021); Cholecystectomy; and Repair peroneal longus tendon with tubularization and anastomosis to peroneus brevis tendon 2. Tenolysis peroneus brevis tendon right lateral ankle (Right, 07/22/2023). Patient denies hx of cancer, stroke, seizures, lung problems, heart problems, unexplained weight loss, and spinal surgery. She states she urinates a lot. This has been going on for 4-5 months. She has not spoken to her doctors about it. She states she cannot hold her urine long if she has an urge to go. She states her hands cramp up and she drops things sometimes. She states her blood is not circulating like it should in the left leg. She states her left hand goes to sleep all the time too.   Exercise history: has 5#DB set at home.   SUBJECTIVE STATEMENT: Patient reports she had some soreness after last session from the heel raises. Did not complete those at home due to the pain. Nausea still comes and goes and notices if she doesn't eat then she is not nauseous.  PAIN: NPRS: 8/10 in the low back  From initial PT evaluation on 02/23/24:  NPRS: Current: 7/10 not that bad,  Best: 7/10, Worst: 10/10, Worst in the last 2 weeks: 10/10 (went to hospital).  Pain location:  left glute Pain description: constant with burning (before injection) at left glute, intermittent radiation down left leg to bottom of foot. Also goes up towards the back. Numbness/tingling: left leg and foot. Lateral ankle and foot on the right.  Aggravating factors: jumping around in the pool, standing up too long, sitting down too long Relieving factors: injection 3 days ago (less burning), heat helped at first, ice helped at first 24 hour clock: no time of day is better  PRECAUTIONS: Fall, not supposed to lift over 5-10 lbs.   WEIGHT BEARING RESTRICTIONS: No  FALLS: Has patient fallen in last 6 months? Yes. Number of falls 1, she was coming in the house and she is not sure if she fell over the threshold. She fell on the left side.  She has a cane and walker in the car and she uses those when her pain is at it's worst.   PLOF: going to the gym, walking (I stayed at the track), liked water aerobics, and loved yardwork. Was not limited in her ability to do these things.   PATIENT GOALS: to get better and to be able to do most of the things she used to do  OBJECTIVE  TREATMENT  Manual therapy: to reduce pain and tissue tension, improve range of motion, neuromodulation, in order to promote improved ability to complete functional activities.  Hooklying lumbar traction with belt under the patients knees with ambulation trial around gym to reassess symptoms   12 x 10 seconds on/off  Neuromuscular Re-education: a technique or exercise performed with the goal of improving the level of communication between the body and the brain, such as for balance, motor control, muscle activation patterns, coordination, desensitization, quality of muscle contraction, proprioception, and/or kinesthetic sense needed for successful and safe completion of functional activities.   Hooklying PPT with bilateral bent knee fall out to improve lumbopelvic coordination and  decrease psoas tension position and decrease hamstrings use to hold PPT (since pt continues to report L hamstring cramping with PPT in hooklying).  2 x 10  Hooklying butterfly PPT with lat pull over to improve lumbo pelvic control with decreased psoas tension   2 x 10  Therapeutic exercise: therapeutic exercises that incorporate ONE parameter at one or more areas of the body to centralize symptoms, develop strength and endurance, range of motion, and flexibility required for successful completion of functional activities.   Hooklying LTR to reduce tension   1 x 10 each side   Supine L piriformis to reduce tension   2 x 10 with 5 second hold   Supine short arc quads to strengthen LE with toes pointed   2 x 10 each side   Cued to point toes to avoid neural tension   Seated isometric hip adductions with green ball to strengthen legs   2 x 10 with 3 seconds   Seated clamshells to improve Lateral hip strength   2 x 10   Standing hip flexor stretch on stool to reduce psoas tension   2 x 15 seconds each side.   Pt required multimodal cuing for proper technique and to facilitate improved neuromuscular control, strength, range of motion, and functional ability resulting in improved performance and form.  40 min total: 10 min MT, 24 min TE, 6 min NR  PATIENT EDUCATION:  Education details: Exercise purpose/form. Self management techniques. Education on diagnosis, prognosis, POC, anatomy and physiology of current condition. Education on HEP including handout. Person educated: Patient Education method: Explanation, Demonstration, and Handouts Education comprehension: verbalized understanding, returned demonstration, and needs further education  HOME EXERCISE PROGRAM: Access Code: [email or web address] URL: [email or web address] Date: 03/22/2024 Prepared by: Vernell Rise  Exercises - Sitting to Supine Roll  - Supine Pelvic Tilt  - 2 x daily - 1 sets - 20 reps - Supine Transversus  Abdominis Bracing with Double Leg Fallout  - 2 x daily - 2 sets - 20 reps - Hooklying Shoulder I  - 2 x daily - 2 sets - 20 reps - Supine Piriformis Stretch with Foot on Ground  - 1 x daily - 2 sets - 15 second hold - Supine Knee Extension Strengthening  - 1 x daily - 3 sets - 10 reps - Standing Lumbar Traction with Chair  - 1 x daily - 12 sets - 10seconds on and 10 seconds off hold - Hip Flexor Stretch with Chair  - 1 x daily - 2 sets - 15 seconds hold  ASSESSMENT:  CLINICAL IMPRESSION: Patient was cued with hooklying PPT with arm raises to maintain her back flat against the mat table. Patient was cued to point toes during short arc quads to point toes to help avoid a neural tension positioning. Completed  hooklying LTR  to help reduce tension and patient caught a cramp on her inner L thigh at about 5 reps. Completed lumbar traction  and patient noted relief wit traction and feeling on compression upon release. After ambulation patient pain in the low back at a 7/10. Demonstrated how to complete lumbar traction on countertop at home and patient reported the same relief with hanging as she did when PT was pulling. Added the standing hip flexor stretch on a stool to help reduce tension in the hip flexors. Patient noted it was stretching the area that was cramping earlier in the session. Patient reported pain at a 7/10 in her low back and reported the same soreness in her legs.Patient would benefit from continued management of limiting condition by skilled physical therapist to address remaining impairments and functional limitations to work towards stated goals and return to PLOF or maximal functional independence.   Mechanical sensitivities: lumbar load, neural tension, left lumbar sidebending Multifidi:  Psoas: L tighter than R (per pt report in standing stretch)  From initial PT evaluation on 02/23/24:  Patient is a 58 y.o. female referred to outpatient physical therapy with a medical diagnosis of   greater trochanteric pain syndrome of the left lower extremity who presents with signs and symptoms consistent with left hip pain chronic low back pain with bilateral radicular symptoms, but concordant at the left hip. Patient had relief of concordant L hip pain with lumbar unloading, pain was reproduced with SLR test, sensitive to foot position, concordant pain was strongly reproduced with left sidebending at the low back. Patient has accompanying numbness and tingling down the L leg, which is not a common finding in extra-articular hip conditions. She reported the improvement from the RFA did not last beyond about 1 year ago. Hip exam did not reproduce intra-articular pain but was limited by guarding. She did report some improvement with ultrasound-guided steroid injection to the left glutal tendons earlier this week but still experienced breathtaking concordant pain today during examination in the clinic. She has radiographic evidence of L and likely R glute med tears and tendinopathy, and this will be addressed in PT along with GTPS and lumbar sources of left hip pain. She appears to be sensitive to the following mechanical stressors at the lumbar spine: compressive load, left sidebending, neural tension. She appears at risk for falls and would likely benefit from using a RW at all times to help unload the spine as she ambulates with stooped posture that increases load on the low back. Patient presents with significant pain, ROM, muscle guarding, paresthesia, nerve tension, joint stiffness, posture, gait, muscle performance (strength/power/endurance), knowledge, balance, and activity tolerance impairments that are limiting ability to complete usual activities including basic mobility, exercising, walking, standing, homemaking, yardwork, lifting, social life, sleeping, standing, traveling without difficulty. Patient will benefit from skilled physical therapy intervention to address current body structure  impairments and activity limitations to improve function and work towards goals set in current POC in order to return to prior level of function or maximal functional improvement.   OBJECTIVE IMPAIRMENTS: Abnormal gait, decreased activity tolerance, decreased balance, decreased coordination, decreased endurance, decreased knowledge of condition, decreased knowledge of use of DME, decreased mobility, difficulty walking, decreased ROM, decreased strength, hypomobility, increased muscle spasms, impaired flexibility, improper body mechanics, postural dysfunction, obesity, and pain.   ACTIVITY LIMITATIONS: carrying, lifting, bending, sitting, standing, squatting, sleeping, stairs, transfers, bed mobility, dressing, hygiene/grooming, and locomotion level  PARTICIPATION LIMITATIONS: meal prep, cleaning, laundry, community activity, and yard work  PERSONAL FACTORS: Age, limited success in PT for similar pain in the past, Fitness, Past/current experiences, Time since onset of injury/illness/exacerbation, and 3+ comorbidities: allergic rhinitis; Arthritis of right knee; Chronic back pain; Closed fracture of posterior wall of left acetabulum (HCC); Closed nondisplaced fracture of anterior wall of left acetabulum (HCC); DDD (degenerative disc disease), lumbar; Depression; Essential hypertension; Knee pain, bilateral; Localized osteoarthritis of right knee; Microcytic hypochromic anemia; OSA (obstructive sleep apnea); Pain in both feet; Pain medication agreement signed; Elevated LFTs; Gallstones; Jaundice; Choledocholithiasis; and Obesity, Class III, BMI 40-49.9 (morbid obesity) (HCC) on their problem list. she  has a past medical history of Anemia, Anxiety, Arthritis, Chronic back pain, Diabetes mellitus (HCC), H/O degenerative disc disease, Hypertension, Peroneal tendon rupture, right, initial encounter (2025), Sciatica, and Sleep apnea. she  has a past surgical history that includes Total knee arthroplasty (Right,  04/09/2021); breast bioosy; lumbar osteopenia (2023); Hysteroscopy; Intraoperative cholangiogram (N/A, 06/16/2021); Cholecystectomy; and Repair peroneal longus tendon with tubularization and anastomosis to peroneus brevis tendon 2. Tenolysis peroneus brevis tendon right lateral ankle (Right, 07/22/2023) are also affecting patient's functional outcome.   REHAB POTENTIAL: Fair due to limited improvements from PT for similar conditions in the past  CLINICAL DECISION MAKING: Evolving/moderate complexity  EVALUATION COMPLEXITY: Moderate  GOALS: Goals reviewed with patient? No  SHORT TERM GOALS: Target date: 03/08/2024  Patient will be independent with initial home exercise program for self-management of symptoms. Baseline: Initial HEP provided at IE (02/23/24) Goal status: MET  LONG TERM GOALS: Target date: 05/17/2024  Patient will be independent with a long-term home exercise program for self-management of symptoms.  Baseline: Initial HEP provided at IE (02/23/24); Goal status: In progress  2.  Patient will demonstrate improved Modified Oswestry Disability Index (mODI) to equal or less than 30% to demonstrate improvement in overall condition and self-reported functional ability.  Baseline: 44% (02/23/24); Goal status: In progress  3.  Patient will complete 5 Time Sit To Stand in equal or less than 15 seconds with no UE support from 18.5 inch or less surface to demonstrate improved LE strength, power, and balance for functional mobility.  Baseline: unable to stand up without B UE support (02/23/24); Goal status: In progress  4.  Patient will ambulate equal or greater than 1300 feet during 6 Minute Walk Test with LRAD to improve her community mobility.  Baseline: difficulty walking to/from clinic with no AD, to be formally tested at later date (02/23/24); Goal status: In progress  5.  Patient will demonstrate improvement in Patient Specific Functional Scale (PSFS) of equal or greater than  8/10 points to reflect clinically significant improvement in patient's most valued functional activities. Baseline: to be measured at visit 2 as appropriate (02/23/24); 2.3/10 (02/27/2024);  Goal status: In progress  6.  Patient will report NPRS equal or less than 3/10 during functional activities during the last 2 weeks to improve their abilitly to complete community, work and/or recreational activities with less limitation. Baseline: 10/10 (02/23/24); Goal status: In progress  PLAN:  PT FREQUENCY: 2x/week  PT DURATION: 12 weeks  PLANNED INTERVENTIONS: 97164- PT Re-evaluation, 97750- Physical Performance Testing, 97110-Therapeutic exercises, 97530- Therapeutic activity, [id number]- Neuromuscular re-education, 97535- Self Care, [id number]- Manual therapy, [id number]- Gait training, [id number]- Electrical stimulation (unattended), [id number]- Ionotophoresis 4mg /ml Dexamethasone , 79439 (1-2 muscles), 20561 (3+ muscles)- Dry Needling, Patient/Family education, Cryotherapy, and Moist heat  PLAN FOR NEXT SESSION:  Return to lumbar traction and Consider adding resistance to seated clamshells (if tolerated) as well as bridging with PPT. Update HEP  as appropriate, education on mechanical stressors and modifications of activities to avoid them, recover motor control and awareness of modifiers to mechanical sensitivities, retrain motor patterns, regain ROM, improve strength and resilience needed for  performing desired functional performance with appropriate ROM, strength, power, and endurance. Manual therapy as needed.  Vernell Rise, SPT 03/22/2024

## 2024-03-22 ENCOUNTER — Ambulatory Visit: Admitting: Physical Therapy

## 2024-03-22 DIAGNOSIS — M25552 Pain in left hip: Secondary | ICD-10-CM | POA: Diagnosis not present

## 2024-03-22 DIAGNOSIS — M792 Neuralgia and neuritis, unspecified: Secondary | ICD-10-CM

## 2024-03-22 DIAGNOSIS — M5459 Other low back pain: Secondary | ICD-10-CM

## 2024-03-22 DIAGNOSIS — Z9181 History of falling: Secondary | ICD-10-CM

## 2024-03-22 DIAGNOSIS — R2689 Other abnormalities of gait and mobility: Secondary | ICD-10-CM

## 2024-03-23 ENCOUNTER — Encounter: Payer: Self-pay | Admitting: Physical Therapy

## 2024-04-10 ENCOUNTER — Telehealth: Payer: Self-pay | Admitting: Physical Therapy

## 2024-04-10 ENCOUNTER — Ambulatory Visit: Admitting: Physical Therapy

## 2024-04-10 DIAGNOSIS — M5459 Other low back pain: Secondary | ICD-10-CM | POA: Insufficient documentation

## 2024-04-10 DIAGNOSIS — M792 Neuralgia and neuritis, unspecified: Secondary | ICD-10-CM | POA: Insufficient documentation

## 2024-04-10 DIAGNOSIS — R2689 Other abnormalities of gait and mobility: Secondary | ICD-10-CM | POA: Insufficient documentation

## 2024-04-10 DIAGNOSIS — Z9181 History of falling: Secondary | ICD-10-CM | POA: Insufficient documentation

## 2024-04-10 DIAGNOSIS — M25552 Pain in left hip: Secondary | ICD-10-CM | POA: Insufficient documentation

## 2024-04-10 NOTE — Telephone Encounter (Signed)
 Spoke with notifying patient of missed PT visit scheduled at 11:15am today. Patient states no one told her when her next appointment is when she left her last appointment and she did not know she had one today. She states she doesn't have a print out. She thought maybe it was on the 7th and in the afternoon. She said someone was going to call her and tell her when it was. Reminded patient that she should call to confirm her appointments if she doesn't know when the next one is and that we can provide a print out for her of all upcoming appointments. She confirmed her next appointment on 04/12/2024 at 11:15am.    Camie SAUNDERS. Juli, PT, DPT 04/10/2024, 11:29 AM  [facility] [address] [place], [place] [address] P: [phone] I F: [phone]

## 2024-04-12 ENCOUNTER — Ambulatory Visit: Admitting: Physical Therapy

## 2024-04-12 ENCOUNTER — Encounter: Payer: Self-pay | Admitting: Physical Therapy

## 2024-04-12 DIAGNOSIS — M792 Neuralgia and neuritis, unspecified: Secondary | ICD-10-CM | POA: Diagnosis present

## 2024-04-12 DIAGNOSIS — Z9181 History of falling: Secondary | ICD-10-CM

## 2024-04-12 DIAGNOSIS — M25552 Pain in left hip: Secondary | ICD-10-CM | POA: Diagnosis present

## 2024-04-12 DIAGNOSIS — M5459 Other low back pain: Secondary | ICD-10-CM | POA: Diagnosis present

## 2024-04-12 DIAGNOSIS — R2689 Other abnormalities of gait and mobility: Secondary | ICD-10-CM | POA: Diagnosis present

## 2024-04-12 NOTE — Therapy (Signed)
 " OUTPATIENT PHYSICAL THERAPY TREATMENT   Patient Name: Ariel Davis MRN: [id number] DOB:03/01/1966, 59 y.o., female Today's Date: 04/12/2024  END OF SESSION:  PT End of Session - 04/12/24 1143     Visit Number 7    Number of Visits 17    Date for Recertification  05/17/24    Authorization Type [employer]ICARE reporting period from 02/23/2024    Progress Note Due on Visit 10    PT Start Time 1117    PT Stop Time 1157    PT Time Calculation (min) 40 min    Activity Tolerance Patient limited by pain;Patient tolerated treatment well    Behavior During Therapy WFL for tasks assessed/performed           Past Medical History:  Diagnosis Date   Anemia    Anxiety    Arthritis    Chronic back pain    Diabetes mellitus without complication (HCC)    H/O degenerative disc disease    Hypertension    Peroneal tendon rupture, right, initial encounter 2025   Sciatica    Sleep apnea    cpap   Past Surgical History:  Procedure Laterality Date   breast bioosy     CHOLECYSTECTOMY     ENDOSCOPIC RETROGRADE CHOLANGIOPANCREATOGRAPHY (ERCP) WITH PROPOFOL  N/A 06/17/2021   Procedure: ENDOSCOPIC RETROGRADE CHOLANGIOPANCREATOGRAPHY (ERCP) WITH PROPOFOL ;  Surgeon: Ariel Carmine, MD;  Location: [facility];  Service: Endoscopy;  Laterality: N/A;   HYSTEROSCOPY     INTRAOPERATIVE CHOLANGIOGRAM N/A 06/16/2021   Procedure: INTRAOPERATIVE CHOLANGIOGRAM;  Surgeon: Ariel Romano, MD;  Location: [facility];  Service: General;  Laterality: N/A;   LIGAMENT REPAIR Right 07/22/2023   Procedure: REPAIR, LIGAMENT;  Surgeon: Ariel Davis, DPM;  Location: [facility];  Service: Orthopedics/Podiatry;  Laterality: Right;   TOTAL KNEE ARTHROPLASTY Right 04/09/2021   Patient Active Problem List   Diagnosis Date Noted   Obesity, Class III, BMI 40-49.9 (morbid obesity) (HCC) 06/18/2021   Acute cholecystitis 06/17/2021   Choledocholithiasis    Elevated LFTs 06/15/2021   Gallstones     Jaundice    Arthritis of right knee 01/31/2018   DDD (degenerative disc disease), lumbar 01/31/2018   Pain medication agreement signed 01/31/2018   Chronic back pain 01/02/2018   Pain in both feet 08/31/2017   Knee pain, bilateral 07/05/2017   Depression 06/07/2017   OSA (obstructive sleep apnea) 05/12/2017   Closed nondisplaced fracture of anterior wall of left acetabulum (HCC) 08/27/2016   Localized osteoarthritis of right knee 08/27/2016   Closed fracture of posterior wall of left acetabulum (HCC) 08/23/2016   Essential hypertension 06/21/2016   Allergic rhinitis 12/06/2013   Microcytic hypochromic anemia 12/06/2013   PCP: Ariel Verneita PARAS, NP  REFERRING PROVIDER: Curtistine DOROTHA Crandall, DO  REFERRING DIAG:  greater trochanteric pain syndrome of the left lower extremity  THERAPY DIAG:  Pain in left hip  History of falling  Other abnormalities of gait and mobility  Other low back pain  Neuralgia and neuritis  Rationale for Evaluation and Treatment: Rehabilitation  ONSET DATE: 2018  SUBJECTIVE:   PERTINENT HISTORY: Patient is a 58 y.o. 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 This patient's chief complaints consist of left lateral hip pain radiating down left leg with numbness and tingling under foot, and also radiating at low back, leading to the following functional deficits: difficulty with usual activities including basic mobility, exercising, walking, standing,  homemaking, yardwork, lifting, social life, sleeping, standing, traveling. Relevant past medical history and comorbidities include the following: she has Allergic rhinitis; Arthritis of right knee; Chronic back pain; Closed fracture of posterior wall of left acetabulum (HCC); Closed nondisplaced fracture of anterior wall of left acetabulum (HCC); DDD (degenerative disc disease), lumbar; Depression; Essential  hypertension; Knee pain, bilateral; Localized osteoarthritis of right knee; Microcytic hypochromic anemia; OSA (obstructive sleep apnea); Pain in both feet; Pain medication agreement signed; Elevated LFTs; Gallstones; Jaundice; Choledocholithiasis; and Obesity, Class III, BMI 40-49.9 (morbid obesity) (HCC) on their problem list. she  has a past medical history of Anemia, Anxiety, Arthritis, Chronic back pain, Diabetes mellitus (HCC), H/O degenerative disc disease, Hypertension, Peroneal tendon rupture, right, initial encounter (2025), Sciatica, and Sleep apnea. she  has a past surgical history that includes Total knee arthroplasty (Right, 04/09/2021); breast bioosy; lumbar osteopenia (2023); Hysteroscopy; Intraoperative cholangiogram (N/A, 06/16/2021); Cholecystectomy; and Repair peroneal longus tendon with tubularization and anastomosis to peroneus brevis tendon 2. Tenolysis peroneus brevis tendon right lateral ankle (Right, 07/22/2023). Patient denies hx of cancer, stroke, seizures, lung problems, heart problems, unexplained weight loss, and spinal surgery. She states she urinates a lot. This has been going on for 4-5 months. She has not spoken to her doctors about it. She states she cannot hold her urine long if she has an urge to go. She states her hands cramp up and she drops things sometimes. She states her blood is not circulating like it should in the left leg. She states her left hand goes to sleep all the time too.   Exercise history: has 5#DB set at home.   SUBJECTIVE STATEMENT: Patient arrives on RW and states she has increased pain in her left knee and foot. She attributes this to walking farther than normal in the grocery store two days ago when she could not find a motorized cart. She thinks she needs a knee replacement. She states the pain there is about the same today as yesterday. The day she walked her back was hurting so back she couldn't rest at night. Currently the back seems okay. She  states since doing exercises in PT she can stand up straighter now. She usually does her HEP at least 2 times a day, but she went a week since last PT session without doing her HEP. She states she has pain relief for about 1-2 days after doing self traction on the counter at home, but she does not do it at home much.   PAIN: NPRS: 8/10 left knee and foot  From initial PT evaluation on 02/23/24:  NPRS: Current: 7/10 not that bad,  Best: 7/10, Worst: 10/10, Worst in the last 2 weeks: 10/10 (went to hospital).  Pain location: left glute Pain description: constant with burning (before injection) at left glute, intermittent radiation down left leg to bottom of foot. Also goes up towards the back. Numbness/tingling: left leg and foot. Lateral ankle and foot on the right.  Aggravating factors: jumping around in the pool, standing up too long, sitting down too long Relieving factors: injection 3 days ago (less burning), heat helped at first, ice helped at first 24 hour clock: no time of day is better  PRECAUTIONS: Fall, not supposed to lift over 5-10 lbs.   WEIGHT BEARING RESTRICTIONS: No  FALLS: Has patient fallen in last 6 months? Yes. Number of falls 1, she was coming in the house and she is not sure if she fell over the threshold. She fell on the left  side.  She has a cane and walker in the car and she uses those when her pain is at it's worst.   PLOF: going to the gym, walking (I stayed at the track), liked water aerobics, and loved yardwork. Was not limited in her ability to do these things.   PATIENT GOALS: to get better and to be able to do most of the things she used to do  OBJECTIVE                                              TREATMENT  Manual therapy: to reduce pain and tissue tension, improve range of motion, neuromodulation, in order to promote improved ability to complete functional activities.  HOOKLYING Intermittent manual lumbar traction with belt around back of  knees 12x10 seconds on/off Relieved leg symptoms  Neuromuscular Re-education: a technique or exercise performed with the goal of improving the level of communication between the body and the brain, such as for balance, motor control, muscle activation patterns, coordination, desensitization, quality of muscle contraction, proprioception, and/or kinesthetic sense needed for successful and safe completion of functional activities.   Hooklying pelvic tilt AROM in pain limited/free range 1x10 after finding pain limited range and learning how to perform   Hooklying PPT with lat pull over, focusing on keeping abdominal muscles working, feeling for when low back arches or ribs flair and correcting it 3x10 with 4#DB in each hand L leg cramping, improved with cuing to decrease pressure in heels and exhale on shoulder extension, inhale on shoulder extension.   Hooklying anti-extension bridge with toes up focusing on lumbopelvic control while moving hips 1x10 with difficulty due to L LE pain that pt thought was no worse at first but then showed signs of discomfort (discontinued).   Unloading squats with B UE offloading holding towel ends draped over door, to improve squat tolerance and get intermittent unloading effect on lumbar spine 1x3 with chair behind Pt reports it feels really good and shows good/safe form Added to HEP  Therapeutic exercise: therapeutic exercises that incorporate ONE parameter at one or more areas of the body to centralize symptoms, develop strength and endurance, range of motion, and flexibility required for successful completion of functional activities.  Seated unloading lat pull downs: seated in grey chair on airex+a2z pads stacked on each other so knees are below hips. Pulling bar from over head with cuing to bring elbows to floor. To decrease load on the thoracic spine while practicing lumbopelvic control.  1x15 at 15# 2x15 at 25# Feels really good   Pt required  multimodal cuing for proper technique and to facilitate improved neuromuscular control, strength, range of motion, and functional ability resulting in improved performance and form.  PATIENT EDUCATION:  Education details: Exercise purpose/form. Self management techniques. Education on diagnosis, prognosis, POC, anatomy and physiology of current condition. Education on HEP including handout. Person educated: Patient Education method: Explanation, Demonstration, and Handouts Education comprehension: verbalized understanding, returned demonstration, and needs further education  HOME EXERCISE PROGRAM: Access Code: [email or web address] URL: [email or web address] Date: 04/12/2024 Prepared by: Camie Cleverly  Exercises - Sitting to Supine Roll  - Supine Pelvic Tilt  - 2 x daily - 1 sets - 20 reps - Supine Transversus Abdominis Bracing with Double Leg Fallout  - 2 x daily - 2 sets - 20 reps - Hooklying Shoulder I  - 2 x  daily - 2 sets - 20 reps - Supine Piriformis Stretch with Foot on Ground  - 1 x daily - 2 sets - 15 second hold - Supine Knee Extension Strengthening  - 1 x daily - 3 sets - 10 reps - Standing Lumbar Traction with Chair  - 1 x daily - 12 sets - 10seconds on and 10 secodnds off hold - Hip Flexor Stretch with Chair  - 1 x daily - 2 sets - 15 seconds hold - Unloading Squats  - 2-3 x daily - 1 sets - 10 reps  ASSESSMENT:  CLINICAL IMPRESSION: Today's session focused on unloading exercises and advancing exercises for lumbopelvic control. Patient continues to get strong relief in low back and left LE with interventions to provided some level of traction to the lumbar spine. She was encouraged to perform self traction at home over the counter at least daily. She reports relief for 1-2 days following this, but suspect this is much less or she would have already started doing this regularly. Plan to continue discussion with her to better understand how it is impacting her and fits into her  HEP.  She also responded very well to seated lat pulls and standing unloading squats, with the latter added to her HEP. She would benefit from an update to her HEP as there are too many exercises there that could be consolidated and narrowed down to be more targeted for her phase of care. Patient still had some bilateral foot pain after unloading interventions but the higher level of knee and foot pain on the left he arrived with appears to be from low back based on her response to session. Bridges increased patient's leg symptoms, suggesting she was not ready for them, but she had difficulty understanding what those symptoms meant as far as her readiness. Improving her understanding will help her self-regulate better at home to keep from constantly irritating her symptoms and prolonging recovery. Patient would benefit from continued management of limiting condition by skilled physical therapist to address remaining impairments and functional limitations to work towards stated goals and return to PLOF or maximal functional independence.   Mechanical sensitivities: lumbar load, neural tension, left lumbar sidebending Multifidi:  Psoas: L tighter than R (per pt report in standing stretch)  From initial PT evaluation on 02/23/24:  Patient is a 59 y.o. female referred to outpatient physical therapy with a medical diagnosis of  greater trochanteric pain syndrome of the left lower extremity who presents with signs and symptoms consistent with left hip pain chronic low back pain with bilateral radicular symptoms, but concordant at the left hip. Patient had relief of concordant L hip pain with lumbar unloading, pain was reproduced with SLR test, sensitive to foot position, concordant pain was strongly reproduced with left sidebending at the low back. Patient has accompanying numbness and tingling down the L leg, which is not a common finding in extra-articular hip conditions. She reported the improvement from the RFA  did not last beyond about 1 year ago. Hip exam did not reproduce intra-articular pain but was limited by guarding. She did report some improvement with ultrasound-guided steroid injection to the left glutal tendons earlier this week but still experienced breathtaking concordant pain today during examination in the clinic. She has radiographic evidence of L and likely R glute med tears and tendinopathy, and this will be addressed in PT along with GTPS and lumbar sources of left hip pain. She appears to be sensitive to the following mechanical stressors at  the lumbar spine: compressive load, left sidebending, neural tension. She appears at risk for falls and would likely benefit from using a RW at all times to help unload the spine as she ambulates with stooped posture that increases load on the low back. Patient presents with significant pain, ROM, muscle guarding, paresthesia, nerve tension, joint stiffness, posture, gait, muscle performance (strength/power/endurance), knowledge, balance, and activity tolerance impairments that are limiting ability to complete usual activities including basic mobility, exercising, walking, standing, homemaking, yardwork, lifting, social life, sleeping, standing, traveling without difficulty. Patient will benefit from skilled physical therapy intervention to address current body structure impairments and activity limitations to improve function and work towards goals set in current POC in order to return to prior level of function or maximal functional improvement.   OBJECTIVE IMPAIRMENTS: Abnormal gait, decreased activity tolerance, decreased balance, decreased coordination, decreased endurance, decreased knowledge of condition, decreased knowledge of use of DME, decreased mobility, difficulty walking, decreased ROM, decreased strength, hypomobility, increased muscle spasms, impaired flexibility, improper body mechanics, postural dysfunction, obesity, and pain.   ACTIVITY  LIMITATIONS: carrying, lifting, bending, sitting, standing, squatting, sleeping, stairs, transfers, bed mobility, dressing, hygiene/grooming, and locomotion level  PARTICIPATION LIMITATIONS: meal prep, cleaning, laundry, community activity, and yard work  PERSONAL FACTORS: Age, limited success in PT for similar pain in the past, Fitness, Past/current experiences, Time since onset of injury/illness/exacerbation, and 3+ comorbidities: allergic rhinitis; Arthritis of right knee; Chronic back pain; Closed fracture of posterior wall of left acetabulum (HCC); Closed nondisplaced fracture of anterior wall of left acetabulum (HCC); DDD (degenerative disc disease), lumbar; Depression; Essential hypertension; Knee pain, bilateral; Localized osteoarthritis of right knee; Microcytic hypochromic anemia; OSA (obstructive sleep apnea); Pain in both feet; Pain medication agreement signed; Elevated LFTs; Gallstones; Jaundice; Choledocholithiasis; and Obesity, Class III, BMI 40-49.9 (morbid obesity) (HCC) on their problem list. she  has a past medical history of Anemia, Anxiety, Arthritis, Chronic back pain, Diabetes mellitus (HCC), H/O degenerative disc disease, Hypertension, Peroneal tendon rupture, right, initial encounter (2025), Sciatica, and Sleep apnea. she  has a past surgical history that includes Total knee arthroplasty (Right, 04/09/2021); breast bioosy; lumbar osteopenia (2023); Hysteroscopy; Intraoperative cholangiogram (N/A, 06/16/2021); Cholecystectomy; and Repair peroneal longus tendon with tubularization and anastomosis to peroneus brevis tendon 2. Tenolysis peroneus brevis tendon right lateral ankle (Right, 07/22/2023) are also affecting patient's functional outcome.   REHAB POTENTIAL: Fair due to limited improvements from PT for similar conditions in the past  CLINICAL DECISION MAKING: Evolving/moderate complexity  EVALUATION COMPLEXITY: Moderate  GOALS: Goals reviewed with patient? No  SHORT TERM  GOALS: Target date: 03/08/2024  Patient will be independent with initial home exercise program for self-management of symptoms. Baseline: Initial HEP provided at IE (02/23/24) Goal status: MET  LONG TERM GOALS: Target date: 05/17/2024  Patient will be independent with a long-term home exercise program for self-management of symptoms.  Baseline: Initial HEP provided at IE (02/23/24); Goal status: In progress  2.  Patient will demonstrate improved Modified Oswestry Disability Index (mODI) to equal or less than 30% to demonstrate improvement in overall condition and self-reported functional ability.  Baseline: 44% (02/23/24); Goal status: In progress  3.  Patient will complete 5 Time Sit To Stand in equal or less than 15 seconds with no UE support from 18.5 inch or less surface to demonstrate improved LE strength, power, and balance for functional mobility.  Baseline: unable to stand up without B UE support (02/23/24); Goal status: In progress  4.  Patient will ambulate equal or  greater than 1300 feet during 6 Minute Walk Test with LRAD to improve her community mobility.  Baseline: difficulty walking to/from clinic with no AD, to be formally tested at later date (02/23/24); Goal status: In progress  5.  Patient will demonstrate improvement in Patient Specific Functional Scale (PSFS) of equal or greater than 8/10 points to reflect clinically significant improvement in patient's most valued functional activities. Baseline: to be measured at visit 2 as appropriate (02/23/24); 2.3/10 (02/27/2024);  Goal status: In progress  6.  Patient will report NPRS equal or less than 3/10 during functional activities during the last 2 weeks to improve their abilitly to complete community, work and/or recreational activities with less limitation. Baseline: 10/10 (02/23/24); Goal status: In progress  PLAN:  PT FREQUENCY: 2x/week  PT DURATION: 12 weeks  PLANNED INTERVENTIONS: 97164- PT Re-evaluation,  97750- Physical Performance Testing, 97110-Therapeutic exercises, 97530- Therapeutic activity, [id number]- Neuromuscular re-education, 97535- Self Care, 02859- Manual therapy, [id number]- Gait training, [phone]- Electrical stimulation (unattended), [phone]- Ionotophoresis 4mg /ml Dexamethasone , 79439 (1-2 muscles), 20561 (3+ muscles)- Dry Needling, Patient/Family education, Cryotherapy, and Moist heat  PLAN FOR NEXT SESSION:  Unloading circuit, continue progressive exercises that are tolerable focusing on improving lumboplevic control in increasingly functional positions.  Update HEP as appropriate, education on mechanical stressors and modifications of activities to avoid them, recover motor control and awareness of modifiers to mechanical sensitivities, retrain motor patterns, regain ROM, improve strength and resilience needed for  performing desired functional performance with appropriate ROM, strength, power, and endurance. Manual therapy as needed.  Camie SAUNDERS. Juli, PT, DPT, Cert. MDT, PRA-C 04/12/2024, 12:14 PM  [facility] [address] [place], [place] [address] P: [phone] I F: [phone]    "

## 2024-04-17 ENCOUNTER — Ambulatory Visit: Admitting: Physical Therapy

## 2024-04-18 ENCOUNTER — Ambulatory Visit

## 2024-04-18 DIAGNOSIS — M25552 Pain in left hip: Secondary | ICD-10-CM | POA: Diagnosis not present

## 2024-04-18 DIAGNOSIS — R2689 Other abnormalities of gait and mobility: Secondary | ICD-10-CM

## 2024-04-18 DIAGNOSIS — M5459 Other low back pain: Secondary | ICD-10-CM

## 2024-04-18 NOTE — Therapy (Signed)
 " OUTPATIENT PHYSICAL THERAPY TREATMENT   Patient Name: Ariel Davis MRN: [id number] DOB:1965-06-25, 59 y.o., female Today's Date: 04/18/2024  END OF SESSION:  PT End of Session - 04/18/24 0900     Visit Number 8    Number of Visits 17    Date for Recertification  05/17/24    Authorization Type [employer]ICARE reporting period from 02/23/2024    Progress Note Due on Visit 10    PT Start Time 0901    PT Stop Time 0945    PT Time Calculation (min) 44 min    Activity Tolerance Patient limited by pain;Patient tolerated treatment well    Behavior During Therapy WFL for tasks assessed/performed           Past Medical History:  Diagnosis Date   Anemia    Anxiety    Arthritis    Chronic back pain    Diabetes mellitus without complication (HCC)    H/O degenerative disc disease    Hypertension    Peroneal tendon rupture, right, initial encounter 2025   Sciatica    Sleep apnea    cpap   Past Surgical History:  Procedure Laterality Date   breast bioosy     CHOLECYSTECTOMY     ENDOSCOPIC RETROGRADE CHOLANGIOPANCREATOGRAPHY (ERCP) WITH PROPOFOL  N/A 06/17/2021   Procedure: ENDOSCOPIC RETROGRADE CHOLANGIOPANCREATOGRAPHY (ERCP) WITH PROPOFOL ;  Surgeon: Jinny Carmine, MD;  Location: [facility];  Service: Endoscopy;  Laterality: N/A;   HYSTEROSCOPY     INTRAOPERATIVE CHOLANGIOGRAM N/A 06/16/2021   Procedure: INTRAOPERATIVE CHOLANGIOGRAM;  Surgeon: Rodolph Romano, MD;  Location: [facility];  Service: General;  Laterality: N/A;   LIGAMENT REPAIR Right 07/22/2023   Procedure: REPAIR, LIGAMENT;  Surgeon: Ashley Soulier, DPM;  Location: [facility];  Service: Orthopedics/Podiatry;  Laterality: Right;   TOTAL KNEE ARTHROPLASTY Right 04/09/2021   Patient Active Problem List   Diagnosis Date Noted   Obesity, Class III, BMI 40-49.9 (morbid obesity) (HCC) 06/18/2021   Acute cholecystitis 06/17/2021   Choledocholithiasis    Elevated LFTs 06/15/2021   Gallstones     Jaundice    Arthritis of right knee 01/31/2018   DDD (degenerative disc disease), lumbar 01/31/2018   Pain medication agreement signed 01/31/2018   Chronic back pain 01/02/2018   Pain in both feet 08/31/2017   Knee pain, bilateral 07/05/2017   Depression 06/07/2017   OSA (obstructive sleep apnea) 05/12/2017   Closed nondisplaced fracture of anterior wall of left acetabulum (HCC) 08/27/2016   Localized osteoarthritis of right knee 08/27/2016   Closed fracture of posterior wall of left acetabulum (HCC) 08/23/2016   Essential hypertension 06/21/2016   Allergic rhinitis 12/06/2013   Microcytic hypochromic anemia 12/06/2013   PCP: Gayle Verneita PARAS, NP  REFERRING PROVIDER: Curtistine DOROTHA Crandall, DO  REFERRING DIAG:  greater trochanteric pain syndrome of the left lower extremity  THERAPY DIAG:  Pain in left hip  Other abnormalities of gait and mobility  Other low back pain  Rationale for Evaluation and Treatment: Rehabilitation  ONSET DATE: 2018  SUBJECTIVE:   PERTINENT HISTORY: Patient is a 59 y.o. 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 This patient's chief complaints consist of left lateral hip pain radiating down left leg with numbness and tingling under foot, and also radiating at low back, leading to the following functional deficits: difficulty with usual activities including basic mobility, exercising, walking, standing, homemaking, yardwork, lifting, social life, sleeping, standing, traveling.  Relevant past medical history and comorbidities include the following: she has Allergic rhinitis; Arthritis of right knee; Chronic back pain; Closed fracture of posterior wall of left acetabulum (HCC); Closed nondisplaced fracture of anterior wall of left acetabulum (HCC); DDD (degenerative disc disease), lumbar; Depression; Essential hypertension; Knee pain, bilateral; Localized  osteoarthritis of right knee; Microcytic hypochromic anemia; OSA (obstructive sleep apnea); Pain in both feet; Pain medication agreement signed; Elevated LFTs; Gallstones; Jaundice; Choledocholithiasis; and Obesity, Class III, BMI 40-49.9 (morbid obesity) (HCC) on their problem list. she  has a past medical history of Anemia, Anxiety, Arthritis, Chronic back pain, Diabetes mellitus (HCC), H/O degenerative disc disease, Hypertension, Peroneal tendon rupture, right, initial encounter (2025), Sciatica, and Sleep apnea. she  has a past surgical history that includes Total knee arthroplasty (Right, 04/09/2021); breast bioosy; lumbar osteopenia (2023); Hysteroscopy; Intraoperative cholangiogram (N/A, 06/16/2021); Cholecystectomy; and Repair peroneal longus tendon with tubularization and anastomosis to peroneus brevis tendon 2. Tenolysis peroneus brevis tendon right lateral ankle (Right, 07/22/2023). Patient denies hx of cancer, stroke, seizures, lung problems, heart problems, unexplained weight loss, and spinal surgery. She states she urinates a lot. This has been going on for 4-5 months. She has not spoken to her doctors about it. She states she cannot hold her urine long if she has an urge to Eila Runyan. She states her hands cramp up and she drops things sometimes. She states her blood is not circulating like it should in the left leg. She states her left hand goes to sleep all the time too.   Exercise history: has 5#DB set at home.   SUBJECTIVE STATEMENT: Patient reports 7/10 NPS; she reports she hasn't lifted anything at home. She reports that she felt pretty good after the last session. Pain in the L hip.   PAIN: NPRS: 710 posterior thigh left knee and foot  From initial PT evaluation on 02/23/24:  NPRS: Current: 7/10 not that bad,  Best: 7/10, Worst: 10/10, Worst in the last 2 weeks: 10/10 (went to hospital).  Pain location: left glute Pain description: constant with burning (before injection) at left glute,  intermittent radiation down left leg to bottom of foot. Also goes up towards the back. Numbness/tingling: left leg and foot. Lateral ankle and foot on the right.  Aggravating factors: jumping around in the pool, standing up too long, sitting down too long Relieving factors: injection 3 days ago (less burning), heat helped at first, ice helped at first 24 hour clock: no time of day is better  PRECAUTIONS: Fall, not supposed to lift over 5-10 lbs.   WEIGHT BEARING RESTRICTIONS: No  FALLS: Has patient fallen in last 6 months? Yes. Number of falls 1, she was coming in the house and she is not sure if she fell over the threshold. She fell on the left side.  She has a cane and walker in the car and she uses those when her pain is at it's worst.   PLOF: going to the gym, walking (I stayed at the track), liked water aerobics, and loved yardwork. Was not limited in her ability to do these things.   PATIENT GOALS: to get better and to be able to do most of the things she used to do  OBJECTIVE                                              TREATMENT  Manual therapy (2 min unbilled): to reduce pain and tissue tension, improve range of motion, neuromodulation, in order to promote improved ability to complete functional activities.  HOOKLYING Intermittent manual lumbar traction with belt around back of knees 4 x 30s hold  Relieved leg symptoms  Neuromuscular Re-education: a technique or exercise performed with the goal of improving the level of communication between the body and the brain, such as for balance, motor control, muscle activation patterns, coordination, desensitization, quality of muscle contraction, proprioception, and/or kinesthetic sense needed for successful and safe completion of functional activities.   Hooklying pelvic tilt AROM in pain limited/free range 2 x10   Unloading squats with B UE offloading holding towel ends draped over door, to improve squat tolerance and get  intermittent unloading effect on lumbar spine 2 x 10 reps   Neutral Curl Up   6 x 10s Hold   Supine Sciatic Nerve Glides  R/L: 2 min ea leg - PT facilitated initial repetitions in LLE, pt able to perform independently.  Therapeutic exercise: therapeutic exercises that incorporate ONE parameter at one or more areas of the body to centralize symptoms, develop strength and endurance, range of motion, and flexibility required for successful completion of functional activities.     Hooklying Alternating Marches with TA Activation    2 x 10 - VC for pace    Hooklying OH Reach with MB     1 x 10 - 3 Kg MB       Hooklying OH reach with MB - RLE Straight    2 x 10 - 3 Kg MB     Supine 90/90 with OH reach     2 x 10 - 3 Kg MB  Seated unloading lat pull downs: seated in grey chair on airex+a2z pads stacked on each other so knees are below hips. Pulling bar from over head with cuing to bring elbows to floor. To decrease load on the thoracic spine while practicing lumbopelvic control.  1 x 15 - 20# 1 x 15 - 25#  1 x 15 - 30#   Pt required multimodal cuing for proper technique and to facilitate improved neuromuscular control, strength, range of motion, and functional ability resulting in improved performance and form.  PATIENT EDUCATION:  Education details: Exercise purpose/form. Self management techniques. Education on diagnosis, prognosis, POC, anatomy and physiology of current condition. Education on HEP including handout. Person educated: Patient Education method: Explanation, Demonstration, and Handouts Education comprehension: verbalized understanding, returned demonstration, and needs further education  HOME EXERCISE PROGRAM: Access Code: [email or web address] URL: [email or web address] Date: 04/12/2024 Prepared by: Camie Cleverly  Exercises - Sitting to Supine Roll  - Supine Pelvic Tilt  - 2 x daily - 1 sets - 20 reps - Supine Transversus Abdominis Bracing with Double Leg  Fallout  - 2 x daily - 2 sets - 20 reps - Hooklying Shoulder I  - 2 x daily - 2 sets - 20 reps - Supine Piriformis Stretch with Foot on Ground  - 1 x daily - 2 sets - 15 second hold - Supine Knee Extension Strengthening  - 1 x daily - 3 sets - 10 reps - Standing Lumbar Traction with Chair  - 1 x daily - 12 sets - 10seconds on and 10 secodnds off hold - Hip Flexor Stretch with Chair  - 1 x daily - 2 sets - 15 seconds hold - Unloading Squats  - 2-3 x daily - 1 sets - 10 reps  ASSESSMENT:  CLINICAL IMPRESSION:  Today's session focused on unloading exercises and advancing exercises for lumbopelvic control. Patient tolerated progressions to core stability exercises; performed supine 90 90 while maintaining neutral pelvic tilt. Patient also dmeosntrated increased tolerance to prolonged standing in today's session; increased squat repetitions and performed palloff press with minimal cueing. PT facilitated supine sciatic nerve glides and patient was able to perform independently with report of improved neural tension following intervention. Patient would benefit from continued management of limiting condition by skilled physical therapist to address remaining impairments and functional limitations to work towards stated goals and return to PLOF or maximal functional independence.     Mechanical sensitivities: lumbar load, neural tension, left lumbar sidebending Multifidi:  Psoas: L tighter than R (per pt report in standing stretch)  From initial PT evaluation on 02/23/24:  Patient is a 59 y.o. female referred to outpatient physical therapy with a medical diagnosis of  greater trochanteric pain syndrome of the left lower extremity who presents with signs and symptoms consistent with left hip pain chronic low back pain with bilateral radicular symptoms, but concordant at the left hip. Patient had relief of concordant L hip pain with lumbar unloading, pain was reproduced with SLR test, sensitive to foot  position, concordant pain was strongly reproduced with left sidebending at the low back. Patient has accompanying numbness and tingling down the L leg, which is not a common finding in extra-articular hip conditions. She reported the improvement from the RFA did not last beyond about 1 year ago. Hip exam did not reproduce intra-articular pain but was limited by guarding. She did report some improvement with ultrasound-guided steroid injection to the left glutal tendons earlier this week but still experienced breathtaking concordant pain today during examination in the clinic. She has radiographic evidence of L and likely R glute med tears and tendinopathy, and this will be addressed in PT along with GTPS and lumbar sources of left hip pain. She appears to be sensitive to the following mechanical stressors at the lumbar spine: compressive load, left sidebending, neural tension. She appears at risk for falls and would likely benefit from using a RW at all times to help unload the spine as she ambulates with stooped posture that increases load on the low back. Patient presents with significant pain, ROM, muscle guarding, paresthesia, nerve tension, joint stiffness, posture, gait, muscle performance (strength/power/endurance), knowledge, balance, and activity tolerance impairments that are limiting ability to complete usual activities including basic mobility, exercising, walking, standing, homemaking, yardwork, lifting, social life, sleeping, standing, traveling without difficulty. Patient will benefit from skilled physical therapy intervention to address current body structure impairments and activity limitations to improve function and work towards goals set in current POC in order to return to prior level of function or maximal functional improvement.   OBJECTIVE IMPAIRMENTS: Abnormal gait, decreased activity tolerance, decreased balance, decreased coordination, decreased endurance, decreased knowledge of  condition, decreased knowledge of use of DME, decreased mobility, difficulty walking, decreased ROM, decreased strength, hypomobility, increased muscle spasms, impaired flexibility, improper body mechanics, postural dysfunction, obesity, and pain.   ACTIVITY LIMITATIONS: carrying, lifting, bending, sitting, standing, squatting, sleeping, stairs, transfers, bed mobility, dressing, hygiene/grooming, and locomotion level  PARTICIPATION LIMITATIONS: meal prep, cleaning, laundry, community activity, and yard work  PERSONAL FACTORS: Age, limited success in PT for similar pain in the past, Fitness, Past/current experiences, Time since onset of injury/illness/exacerbation, and 3+ comorbidities: allergic rhinitis; Arthritis of right knee; Chronic back pain; Closed fracture of posterior wall of left acetabulum (HCC); Closed nondisplaced fracture of  anterior wall of left acetabulum (HCC); DDD (degenerative disc disease), lumbar; Depression; Essential hypertension; Knee pain, bilateral; Localized osteoarthritis of right knee; Microcytic hypochromic anemia; OSA (obstructive sleep apnea); Pain in both feet; Pain medication agreement signed; Elevated LFTs; Gallstones; Jaundice; Choledocholithiasis; and Obesity, Class III, BMI 40-49.9 (morbid obesity) (HCC) on their problem list. she  has a past medical history of Anemia, Anxiety, Arthritis, Chronic back pain, Diabetes mellitus (HCC), H/O degenerative disc disease, Hypertension, Peroneal tendon rupture, right, initial encounter (2025), Sciatica, and Sleep apnea. she  has a past surgical history that includes Total knee arthroplasty (Right, 04/09/2021); breast bioosy; lumbar osteopenia (2023); Hysteroscopy; Intraoperative cholangiogram (N/A, 06/16/2021); Cholecystectomy; and Repair peroneal longus tendon with tubularization and anastomosis to peroneus brevis tendon 2. Tenolysis peroneus brevis tendon right lateral ankle (Right, 07/22/2023) are also affecting patient's  functional outcome.   REHAB POTENTIAL: Fair due to limited improvements from PT for similar conditions in the past  CLINICAL DECISION MAKING: Evolving/moderate complexity  EVALUATION COMPLEXITY: Moderate  GOALS: Goals reviewed with patient? No  SHORT TERM GOALS: Target date: 03/08/2024  Patient will be independent with initial home exercise program for self-management of symptoms. Baseline: Initial HEP provided at IE (02/23/24) Goal status: MET  LONG TERM GOALS: Target date: 05/17/2024  Patient will be independent with a long-term home exercise program for self-management of symptoms.  Baseline: Initial HEP provided at IE (02/23/24); Goal status: In progress  2.  Patient will demonstrate improved Modified Oswestry Disability Index (mODI) to equal or less than 30% to demonstrate improvement in overall condition and self-reported functional ability.  Baseline: 44% (02/23/24); Goal status: In progress  3.  Patient will complete 5 Time Sit To Stand in equal or less than 15 seconds with no UE support from 18.5 inch or less surface to demonstrate improved LE strength, power, and balance for functional mobility.  Baseline: unable to stand up without B UE support (02/23/24); Goal status: In progress  4.  Patient will ambulate equal or greater than 1300 feet during 6 Minute Walk Test with LRAD to improve her community mobility.  Baseline: difficulty walking to/from clinic with no AD, to be formally tested at later date (02/23/24); Goal status: In progress  5.  Patient will demonstrate improvement in Patient Specific Functional Scale (PSFS) of equal or greater than 8/10 points to reflect clinically significant improvement in patient's most valued functional activities. Baseline: to be measured at visit 2 as appropriate (02/23/24); 2.3/10 (02/27/2024);  Goal status: In progress  6.  Patient will report NPRS equal or less than 3/10 during functional activities during the last 2 weeks to  improve their abilitly to complete community, work and/or recreational activities with less limitation. Baseline: 10/10 (02/23/24); Goal status: In progress  PLAN:  PT FREQUENCY: 2x/week  PT DURATION: 12 weeks  PLANNED INTERVENTIONS: 97164- PT Re-evaluation, 97750- Physical Performance Testing, 97110-Therapeutic exercises, 97530- Therapeutic activity, [id number]- Neuromuscular re-education, 97535- Self Care, [id number]- Manual therapy, [phone]- Gait training, [phone]- Electrical stimulation (unattended), [phone]- Ionotophoresis 4mg /ml Dexamethasone , 79439 (1-2 muscles), 20561 (3+ muscles)- Dry Needling, Patient/Family education, Cryotherapy, and Moist heat  PLAN FOR NEXT SESSION:  Unloading circuit, continue progressive exercises that are tolerable focusing on improving lumboplevic control in increasingly functional positions.  Update HEP as appropriate, education on mechanical stressors and modifications of activities to avoid them, recover motor control and awareness of modifiers to mechanical sensitivities, retrain motor patterns, regain ROM, improve strength and resilience needed for  performing desired functional performance with appropriate ROM, strength, power, and endurance. Manual  therapy as needed.  Lonni Pall PT, DPT Physical Therapist- [place]  04/18/2024, 10:46 AM  [facility] [address] [place], [place] [address] P: [phone] I F: [phone]    "

## 2024-04-19 ENCOUNTER — Ambulatory Visit: Admitting: Physical Therapy

## 2024-05-07 ENCOUNTER — Ambulatory Visit: Admitting: Physical Therapy

## 2024-05-08 ENCOUNTER — Ambulatory Visit: Admitting: Physical Therapy

## 2024-05-10 ENCOUNTER — Ambulatory Visit

## 2024-05-15 ENCOUNTER — Ambulatory Visit: Admitting: Physical Therapy

## 2024-05-17 ENCOUNTER — Ambulatory Visit: Admitting: Physical Therapy

## 2024-05-22 ENCOUNTER — Ambulatory Visit: Admitting: Physical Therapy

## 2024-05-24 ENCOUNTER — Ambulatory Visit: Admitting: Physical Therapy

## 2024-05-29 ENCOUNTER — Ambulatory Visit: Admitting: Physical Therapy

## 2024-05-31 ENCOUNTER — Ambulatory Visit: Admitting: Physical Therapy

## 2024-06-05 ENCOUNTER — Ambulatory Visit: Admitting: Physical Therapy

## 2024-06-07 ENCOUNTER — Ambulatory Visit: Admitting: Physical Therapy

## 2024-06-12 ENCOUNTER — Ambulatory Visit: Admitting: Physical Therapy

## 2024-06-14 ENCOUNTER — Ambulatory Visit: Admitting: Physical Therapy

## 2024-06-19 ENCOUNTER — Ambulatory Visit: Admitting: Physical Therapy

## 2024-06-21 ENCOUNTER — Ambulatory Visit: Admitting: Physical Therapy

## 2024-06-26 ENCOUNTER — Ambulatory Visit: Admitting: Physical Therapy

## 2024-06-28 ENCOUNTER — Ambulatory Visit: Admitting: Physical Therapy
